# Patient Record
Sex: Female | Born: 1960 | Race: Black or African American | Hispanic: No | Smoking: Former smoker
Health system: Southern US, Community
[De-identification: ages and names within clinical notes are randomized; demographics above are authoritative.]

## PROBLEM LIST (undated history)

## (undated) DIAGNOSIS — C50919 Malignant neoplasm of unspecified site of unspecified female breast: Secondary | ICD-10-CM

## (undated) DIAGNOSIS — H269 Unspecified cataract: Secondary | ICD-10-CM

## (undated) DIAGNOSIS — IMO0001 Reserved for inherently not codable concepts without codable children: Secondary | ICD-10-CM

## (undated) DIAGNOSIS — I10 Essential (primary) hypertension: Secondary | ICD-10-CM

## (undated) DIAGNOSIS — D649 Anemia, unspecified: Secondary | ICD-10-CM

## (undated) DIAGNOSIS — Z98811 Dental restoration status: Secondary | ICD-10-CM

## (undated) DIAGNOSIS — F32A Depression, unspecified: Secondary | ICD-10-CM

## (undated) DIAGNOSIS — K219 Gastro-esophageal reflux disease without esophagitis: Secondary | ICD-10-CM

## (undated) DIAGNOSIS — M1990 Unspecified osteoarthritis, unspecified site: Secondary | ICD-10-CM

## (undated) DIAGNOSIS — F419 Anxiety disorder, unspecified: Secondary | ICD-10-CM

## (undated) DIAGNOSIS — IMO0002 Reserved for concepts with insufficient information to code with codable children: Secondary | ICD-10-CM

## (undated) DIAGNOSIS — E785 Hyperlipidemia, unspecified: Secondary | ICD-10-CM

## (undated) HISTORY — DX: Anxiety disorder, unspecified: F41.9

## (undated) HISTORY — DX: Depression, unspecified: F32.A

## (undated) HISTORY — DX: Anemia, unspecified: D64.9

## (undated) HISTORY — DX: Essential (primary) hypertension: I10

## (undated) HISTORY — DX: Unspecified osteoarthritis, unspecified site: M19.90

## (undated) HISTORY — DX: Hyperlipidemia, unspecified: E78.5

## (undated) HISTORY — DX: Reserved for concepts with insufficient information to code with codable children: IMO0002

## (undated) HISTORY — PX: FOOT SURGERY: SHX648

## (undated) HISTORY — DX: Reserved for inherently not codable concepts without codable children: IMO0001

## (undated) HISTORY — DX: Unspecified cataract: H26.9

---

## 1998-09-07 ENCOUNTER — Other Ambulatory Visit: Admission: RE | Admit: 1998-09-07 | Discharge: 1998-09-07 | Payer: Self-pay | Admitting: Obstetrics and Gynecology

## 1999-11-22 ENCOUNTER — Other Ambulatory Visit: Admission: RE | Admit: 1999-11-22 | Discharge: 1999-11-22 | Payer: Self-pay | Admitting: Obstetrics and Gynecology

## 1999-12-10 ENCOUNTER — Ambulatory Visit (HOSPITAL_COMMUNITY): Admission: RE | Admit: 1999-12-10 | Discharge: 1999-12-10 | Payer: Self-pay | Admitting: Obstetrics and Gynecology

## 1999-12-10 ENCOUNTER — Encounter: Payer: Self-pay | Admitting: Obstetrics and Gynecology

## 2000-10-26 ENCOUNTER — Encounter: Admission: RE | Admit: 2000-10-26 | Discharge: 2000-10-26 | Payer: Self-pay | Admitting: Family Medicine

## 2000-10-26 ENCOUNTER — Encounter: Payer: Self-pay | Admitting: Family Medicine

## 2000-12-30 ENCOUNTER — Other Ambulatory Visit: Admission: RE | Admit: 2000-12-30 | Discharge: 2000-12-30 | Payer: Self-pay | Admitting: Obstetrics and Gynecology

## 2001-11-29 ENCOUNTER — Encounter: Admission: RE | Admit: 2001-11-29 | Discharge: 2002-02-27 | Payer: Self-pay | Admitting: Family Medicine

## 2001-12-16 ENCOUNTER — Ambulatory Visit (HOSPITAL_COMMUNITY): Admission: RE | Admit: 2001-12-16 | Discharge: 2001-12-16 | Payer: Self-pay | Admitting: Obstetrics and Gynecology

## 2001-12-16 ENCOUNTER — Encounter: Payer: Self-pay | Admitting: Obstetrics and Gynecology

## 2002-01-25 ENCOUNTER — Other Ambulatory Visit: Admission: RE | Admit: 2002-01-25 | Discharge: 2002-01-25 | Payer: Self-pay | Admitting: Obstetrics and Gynecology

## 2003-02-07 ENCOUNTER — Ambulatory Visit (HOSPITAL_COMMUNITY): Admission: RE | Admit: 2003-02-07 | Discharge: 2003-02-07 | Payer: Self-pay | Admitting: Obstetrics and Gynecology

## 2004-07-31 ENCOUNTER — Other Ambulatory Visit: Admission: RE | Admit: 2004-07-31 | Discharge: 2004-07-31 | Payer: Self-pay | Admitting: Obstetrics and Gynecology

## 2005-09-22 ENCOUNTER — Other Ambulatory Visit: Admission: RE | Admit: 2005-09-22 | Discharge: 2005-09-22 | Payer: Self-pay | Admitting: Obstetrics and Gynecology

## 2007-12-16 ENCOUNTER — Encounter (INDEPENDENT_AMBULATORY_CARE_PROVIDER_SITE_OTHER): Payer: Self-pay | Admitting: Obstetrics and Gynecology

## 2007-12-16 ENCOUNTER — Inpatient Hospital Stay (HOSPITAL_COMMUNITY): Admission: RE | Admit: 2007-12-16 | Discharge: 2007-12-18 | Payer: Self-pay | Admitting: Obstetrics and Gynecology

## 2007-12-16 HISTORY — PX: ABDOMINAL HYSTERECTOMY: SHX81

## 2009-05-03 ENCOUNTER — Encounter: Admission: RE | Admit: 2009-05-03 | Discharge: 2009-05-03 | Payer: Self-pay | Admitting: Obstetrics and Gynecology

## 2009-09-27 ENCOUNTER — Ambulatory Visit (HOSPITAL_COMMUNITY): Admission: RE | Admit: 2009-09-27 | Discharge: 2009-09-27 | Payer: Self-pay | Admitting: Psychiatry

## 2010-02-24 ENCOUNTER — Encounter: Payer: Self-pay | Admitting: Family Medicine

## 2010-06-18 NOTE — H&P (Signed)
[name], [name]                ACCOUNT NO.:  [id number]   MEDICAL RECORD NO.:  [id number]        PATIENT TYPE:  WAMB   LOCATION:                                FACILITY:  [facility]   PHYSICIAN:  Sherron Monday, MD        DATE OF BIRTH:  26-Jul-1960   DATE OF ADMISSION:  DATE OF DISCHARGE:                              HISTORY & PHYSICAL   PROCEDURE PLANNED:  Laprascopic Hysterectomy, Possible total abdominal  hysterectomy.   PREOPERATIVE DIAGNOSES:  Dysmenorrhea and fibroids.   HISTORY OF PRESENT ILLNESS:  A 50 year old G2 P2 0-0-2 with complaints  of dysmenorrhea and premenstrual dysphoric disorder, as well as fibroid  uterus.  On exam, she states that she has trouble getting out of bed  when she is having her menstrual cycle and they have become heavier.   PAST MEDICAL HISTORY:  Significant for hematuria with a negative workup.  Past medical history is also significant for depression.  She has  recently started Lexapro with her primary care doctor.   PAST SURGICAL HISTORY:  Significant for bilateral tubal ligation.   PAST OB/GYN HISTORY:  G2 P2 0-0-2 with two term vaginal deliveries  without complications.  She has a remote history of an abnormal Pap  smear with remote cryotherapy.  They have been normal since this note.  No sexually transmitted diseases.  She is occasionally sexually active  with a female partner.  She has irregular menstrual cycles, uses it like  tubal for birth control and occasionally spots with her period, but not  bleeding in between periods or postcoital bleeding.  Hemoglobin is 12.3.   MEDICATIONS:  None.   ALLERGIES:  No known drug allergies.   SOCIAL HISTORY:  She is a 3-4 cigarettes per day smoker.  Occasional  alcohol use, no drug use.  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   FAMILY HISTORY:  Significant for coronary artery disease in father,  hypertension in mother and father, diabetes in maternal grandmother,  breast cancer in maternal aunt,  and no colon or ovarian cancer in her  family.   PHYSICAL EXAMINATION:  GENERAL:  No apparent distress.  CARDIOVASCULAR:  Regular rate and rhythm.  LUNGS:  Clear to auscultation bilaterally.  ABDOMEN:  Soft, nontender, nondistended with good bowel sounds.  EXTREMITIES:  Symmetric and nontender.  NECK:  No lymphadenopathy.  Thyroid within normal limits.  HEENT: Mucous membranes are moist.  BREAST:  No masses, nontender.  No distortion.  BACK:  Without vertebral angle tenderness.  PELVIC:  Normal external female genitalia.  Normal Bartholin and  urethral glands.  Good support.  Cervix and vagina without lesion.  No  cervical motion tenderness.  ABDOMINAL:  At the time of the initial exam, she had malodorous  discharge and firm.  Trichomonas and bacterial vaginitis for diagnosis  and these were treated with Flagyl.  On exam, her uterus was 14 weeks  size with obvious fibroids.   ASSESSMENT AND PLAN:  A 50 year old G2, P2 with dysmenorrhea, history of  premenstrual dysphoric disorder, and fibroid uterus who desires  definitive therapy.  We discussed with her risks, benefits, and  alternatives of hysterectomy including bleeding, infection, damage to  surrounding organs.  She voiced understanding of this and wishes to  proceed.  We will proceed with a hysterectomy on December 16, 2007.      Sherron Monday, MD  Electronically Signed     JB/MEDQ  D:  12/15/2007  T:  12/16/2007  Job:  [id number]

## 2010-06-18 NOTE — Op Note (Signed)
NAMEBRAYLIN, Jean [name]                ACCOUNT NO.:  [id number]   MEDICAL RECORD NO.:  [id number]          PATIENT TYPE:  INP   LOCATION:  9316                          FACILITY:  [facility]   PHYSICIAN:  Sherron Monday, MD        DATE OF BIRTH:  1960-03-27   DATE OF PROCEDURE:  DATE OF DISCHARGE:                               OPERATIVE REPORT   PREOPERATIVE DIAGNOSIS:  Dysmenorrhea.   POSTOPERATIVE DIAGNOSIS:  Dysmenorrhea.   PROCEDURE:  1. Diagnostic laparoscopy.  2. Total abdominal hysterectomy.   SURGEON:  Sherron Monday, MD   ASSISTANT:  Zenaida Niece, MD   ANESTHESIA:  General with 5 mL of 0.25% Marcaine for local anesthesia at  the umbilicus.   FINDINGS:  A 364-g uterus with large posterior fundal fibroid filling  the cul-de-sac.  Normal tubes and ovaries.   SPECIMENS:  Uterus and cervix to Pathology.   ESTIMATED BLOOD LOSS:  300 mL.   URINE OUTPUT:  600 mL clear urine at the end of procedure.   IV FLUIDS:  2800 mL IV fluids.   COMPLICATIONS:  None.   DISPOSITION:  Stable to the PACU.   PROCEDURE:  After informed consent was reviewed the patient including  risks, benefits, and alternatives of the surgical procedure, she was  transported to the operating room.  In the discussion about the  procedure, we discussed with the patient plan for a total laparoscopic  hysterectomy; however, if secondary to the size of her uterus or the  fibroids of the uterus making it difficult, we would have to proceed  with a total abdominal hysterectomy.  The patient voiced understanding.  She was then taken to the operating room, placed on the table in supine  position.  General anesthesia was induced and found to be adequate.  She  was then placed in the Yellofin stirrups, prepped and draped in the  normal sterile fashion.  A Foley catheter was sterilely placed in her  bladder.  Using a heavy weighted speculum and a Sims retractor, the  cervix was easily identified, was sized with the  instrumentation for a  total laparoscopic hysterectomy with the RUMI and cup, and the uterine  occluder was placed without difficulty.  Gloves were changed, and  attention was turned to the abdominal portion of the procedure.  At the  level of her previous incision in her umbilicus, an approximately 15-mm  incision was made.  Using the Veress needle, pneumoperitoneum was  obtained after checked to make sure the Veress was intraperitoneally  with the hanging drop test.  The trocar was then placed without  difficulty with direct placement, and the pelvic survey was performed  revealing a large posterior fundal fibroid entirely filling the cul-de-  sac and an otherwise bulky uterus making it impossible to proceed with a  total laparoscopic hysterectomy.  Decision was then made to convert to  an abdominal hysterectomy.  The instruments were removed from her  abdomen and vagina.  A single suture of 0 Vicryl was placed to  reapproximate the subcuticular adipose tissue.  Skin was closed with  3-0  Vicryl in a subcuticular fashion.  Attention was then turned to the  Pfannenstiel skin incision was made approximately 2 fingerbreadths above  the pubic symphysis, carried through the underlying layer of fascia.  Sharply, the fascia was then incised in the midline, and the incision  was extended laterally with Mayo scissors.  The inferior aspect of the  fascial incision was grasped and elevated with Kocher clamps, and the  rectus muscles were dissected off bluntly.  Attention was then turned to  the superior aspect of the fascial incision which in a similar fashion  was grasped with Kocher clamps, elevated, and the rectus muscles were  dissected off both bluntly and sharply.  Midline was easily identified  and peritoneum was entered bluntly.  The incision was extended  superiorly and inferiorly with good visualization of the bladder.  The  Alexis skin retractor was placed and checked carefully to make  sure no  bowel was entrapped.  The patient was then placed in Trendelenburg, and  the bowel was packed with 3 moist laparotomy sponges.  The uterus was  grasped at the cornu with Kelly clamps.  The round ligaments were easily  identified and ligated with Bovie cautery.  Anteriorly, the bladder flap  was created using Bovie cautery.  On the patient's left, the ovary was  adhered to the uterus.  This was removed using sharp dissection.  The  utero-ovarian vessel was then doubly ligated with 0 Vicryl.  Attention  was turned to skeletonizing the uterine arteries which was done.  The  arteries were suture ligated with 0 Vicryl bilaterally.  The cardinal  ligaments were ligated, pedicles were inspected and found to be  hemostatic.  In a stepwise fashion, the pedicles were suture ligated  using 0 Vicryl.  At the level of the uterosacral, the pedicle was held.  The uterus was easily transected and removed.  The vaginal cuff was  closed with several sutures of 0 Vicryl.  Hemostasis was assured.  The  pedicles were inspected carefully.  Copious irrigation was performed.  An uterosacral ligament plication was performed.  The ureters were  identified during the dissection and found to be well outside the field.  Following the uterosacral ligament plication, the Alexis skin retractor  was removed as well as with 3 sponges.  The peritoneum was manually  reapproximated.  The rectus muscles were made hemostatic with Bovie  cautery.  The fascia was then reapproximated using 0 Vicryl in a running  fashion to the midline bilaterally.  The subcuticular adipose layer was  made hemostatic and irrigated.  The subcuticular adipose layer was then  reapproximated using 2-0 plain gut.  Skin was closed with staples.  The  patient tolerated the procedure well.  Sponge, lap, and needle counts  were correct x2 at the end of the procedure.      Sherron Monday, MD  Electronically Signed     JB/MEDQ  D:  12/16/2007   T:  12/16/2007  Job:  [id number]

## 2010-06-21 NOTE — Discharge Summary (Signed)
Jean [name], Jean [name]                ACCOUNT NO.:  [id number]   MEDICAL RECORD NO.:  [id number]          PATIENT TYPE:  INP   LOCATION:  9316                          FACILITY:  [facility]   PHYSICIAN:  Sherron Monday, MD        DATE OF BIRTH:  1960/12/25   DATE OF ADMISSION:  12/16/2007  DATE OF DISCHARGE:  12/18/2007                               DISCHARGE SUMMARY   PREOPERATIVE DIAGNOSIS:  Dysmenorrhea, uterine fibroids.   POSTOPERATIVE DIAGNOSIS:  Dysmenorrhea,  uterine fibroids.   PROCEDURE:  Attempt of laparoscopic assisted vaginal hysterectomy, total  abdominal hysterectomy.   HISTORY:  A 50 year old G2, P2-0-0-2 with selective dysmenorrhea and  PMDD as well as fibroids.  On exam, she has trouble getting out of bed  with her menstrual cycle because she has so much pain and cramping.  They have also become heavier in the last several months.   PAST MEDICAL HISTORY:  Significant for hematuria with negative workup  and also depression, and she has recently started Lexapro.   PAST SURGICAL HISTORY:  Significant for bilateral tubal ligation.   PAST OB/GYN HISTORY:  G2, P2 with 2 term vaginal deliveries without  complications, remote history of an abnormal Pap smear with cryotherapy,  normal since this time.  No sexually transmitted disease.   MEDICATIONS:  None.   ALLERGIES:  No known drug allergies.   SOCIAL HISTORY:  A 3-4 cigarettes per day smoker, occasional alcohol  use.  No drug use.  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   FAMILY HISTORY:  Significant for coronary artery disease, hypertension,  diabetes, and breast cancer.   PHYSICAL EXAMINATION:  On admission, she is afebrile.  Vital signs are  stable and a benign exam.  She was admitted and the plan was to perform  the laparoscopic vaginal hysterectomy with possibility of a total  abdominal hysterectomy.  Discussed with the patient risks, benefits, and  alternatives of the surgical procedure.  She voiced  understanding of all  of these.  On the day of surgery, she underwent a diagnostic  laparoscopy, posterior fibroid was noted __________ make it impossible  to do a laparoscopic hysterectomy.  We will proceed with a total  abdominal hysterectomy without complication.  Her uterus weighed 364 g.  The EBL was 300 mL.  Her postoperative course was relatively  uncomplicated, aside from elevated blood pressure.  She was started on  atenolol for blood pressure control and will follow this up with her  primary care Aadhira Heffernan.  Her postoperative course was otherwise  uncomplicated.  Her hemoglobin decreased from 11.9 to 10.0.  Her  creatinine was stable at 0.64.  She was  discharged to home on postoperative day #2 with routine discharge  instructions and numbers to call with any questions or problems as well  as prescriptions for Percocet and some atenolol.  She  voiced  understanding of this and will be discharged to home and follow up in  about several weeks.      Sherron Monday, MD  Electronically Signed     JB/MEDQ  D:  03/03/2008  T:  03/03/2008  Job:  [id number]

## 2010-11-05 LAB — BASIC METABOLIC PANEL
BUN: 14
BUN: 5 — ABNORMAL LOW
CO2: 24
Calcium: 8.7
Calcium: 9.1
Chloride: 106
Creatinine, Ser: 0.62
Creatinine, Ser: 0.64
GFR calc Af Amer: >60
GFR calc non Af Amer: >60
GFR calc non Af Amer: >60
Glucose, Bld: 107 — ABNORMAL HIGH
Glucose, Bld: 119 — ABNORMAL HIGH
Potassium: 4
Potassium: 4.4
Sodium: 138

## 2010-11-05 LAB — CBC
HCT: 36.1
MCV: 84.3
Platelets: 320
Platelets: 349
RDW: 13.5
RDW: 14.3
WBC: 4.5
WBC: 8.8

## 2010-11-05 LAB — PREGNANCY, URINE: Preg Test, Ur: NEGATIVE

## 2011-02-11 ENCOUNTER — Other Ambulatory Visit: Payer: Self-pay | Admitting: Family Medicine

## 2011-02-11 ENCOUNTER — Other Ambulatory Visit (HOSPITAL_COMMUNITY)
Admission: RE | Admit: 2011-02-11 | Discharge: 2011-02-11 | Disposition: A | Discharge status: Home / Self Care | Payer: BC Managed Care – PPO | Source: Ambulatory Visit | Attending: Family Medicine | Admitting: Family Medicine

## 2011-02-11 DIAGNOSIS — Z1159 Encounter for screening for other viral diseases: Secondary | ICD-10-CM | POA: Insufficient documentation

## 2011-02-11 DIAGNOSIS — Z124 Encounter for screening for malignant neoplasm of cervix: Secondary | ICD-10-CM | POA: Insufficient documentation

## 2011-02-11 DIAGNOSIS — Z113 Encounter for screening for infections with a predominantly sexual mode of transmission: Secondary | ICD-10-CM | POA: Insufficient documentation

## 2011-02-14 ENCOUNTER — Other Ambulatory Visit: Payer: Self-pay | Admitting: Family Medicine

## 2011-02-14 DIAGNOSIS — Z1231 Encounter for screening mammogram for malignant neoplasm of breast: Secondary | ICD-10-CM

## 2011-02-24 ENCOUNTER — Ambulatory Visit
Admission: RE | Admit: 2011-02-24 | Discharge: 2011-02-24 | Disposition: A | Discharge status: Home / Self Care | Payer: BC Managed Care – PPO | Source: Ambulatory Visit | Attending: Family Medicine | Admitting: Family Medicine

## 2011-02-24 DIAGNOSIS — Z1231 Encounter for screening mammogram for malignant neoplasm of breast: Secondary | ICD-10-CM

## 2012-04-23 ENCOUNTER — Other Ambulatory Visit: Payer: Self-pay

## 2012-04-23 DIAGNOSIS — Z1231 Encounter for screening mammogram for malignant neoplasm of breast: Secondary | ICD-10-CM

## 2012-06-01 ENCOUNTER — Ambulatory Visit
Admission: RE | Admit: 2012-06-01 | Discharge: 2012-06-01 | Disposition: A | Discharge status: Home / Self Care | Payer: BC Managed Care – PPO | Source: Ambulatory Visit

## 2012-06-01 DIAGNOSIS — Z1231 Encounter for screening mammogram for malignant neoplasm of breast: Secondary | ICD-10-CM

## 2013-06-20 ENCOUNTER — Other Ambulatory Visit: Payer: Self-pay | Admitting: Physician Assistant

## 2013-06-20 DIAGNOSIS — R51 Headache: Secondary | ICD-10-CM

## 2013-07-13 ENCOUNTER — Other Ambulatory Visit: Payer: Self-pay

## 2013-07-13 DIAGNOSIS — Z1231 Encounter for screening mammogram for malignant neoplasm of breast: Secondary | ICD-10-CM

## 2013-07-20 ENCOUNTER — Encounter (INDEPENDENT_AMBULATORY_CARE_PROVIDER_SITE_OTHER): Payer: Self-pay

## 2013-07-20 ENCOUNTER — Ambulatory Visit
Admission: RE | Admit: 2013-07-20 | Discharge: 2013-07-20 | Disposition: A | Discharge status: Home / Self Care | Payer: BC Managed Care – PPO | Source: Ambulatory Visit

## 2013-07-20 DIAGNOSIS — Z1231 Encounter for screening mammogram for malignant neoplasm of breast: Secondary | ICD-10-CM

## 2013-07-22 ENCOUNTER — Other Ambulatory Visit: Payer: Self-pay | Admitting: Obstetrics and Gynecology

## 2013-07-22 DIAGNOSIS — R928 Other abnormal and inconclusive findings on diagnostic imaging of breast: Secondary | ICD-10-CM

## 2013-08-01 ENCOUNTER — Ambulatory Visit
Admission: RE | Admit: 2013-08-01 | Discharge: 2013-08-01 | Disposition: A | Discharge status: Home / Self Care | Payer: BC Managed Care – PPO | Source: Ambulatory Visit | Attending: Obstetrics and Gynecology | Admitting: Obstetrics and Gynecology

## 2013-08-01 ENCOUNTER — Other Ambulatory Visit: Payer: Self-pay | Admitting: Obstetrics and Gynecology

## 2013-08-01 ENCOUNTER — Ambulatory Visit: Payer: BC Managed Care – PPO

## 2013-08-01 DIAGNOSIS — R928 Other abnormal and inconclusive findings on diagnostic imaging of breast: Secondary | ICD-10-CM

## 2013-08-01 DIAGNOSIS — N6320 Unspecified lump in the left breast, unspecified quadrant: Secondary | ICD-10-CM

## 2013-08-03 DIAGNOSIS — C50919 Malignant neoplasm of unspecified site of unspecified female breast: Secondary | ICD-10-CM

## 2013-08-03 HISTORY — DX: Malignant neoplasm of unspecified site of unspecified female breast: C50.919

## 2013-08-09 ENCOUNTER — Ambulatory Visit
Admission: RE | Admit: 2013-08-09 | Discharge: 2013-08-09 | Disposition: A | Discharge status: Home / Self Care | Payer: BC Managed Care – PPO | Source: Ambulatory Visit | Attending: Obstetrics and Gynecology | Admitting: Obstetrics and Gynecology

## 2013-08-09 ENCOUNTER — Other Ambulatory Visit: Payer: Self-pay | Admitting: Obstetrics and Gynecology

## 2013-08-09 DIAGNOSIS — N6320 Unspecified lump in the left breast, unspecified quadrant: Secondary | ICD-10-CM

## 2013-08-10 ENCOUNTER — Other Ambulatory Visit: Payer: Self-pay | Admitting: Obstetrics and Gynecology

## 2013-08-10 DIAGNOSIS — C50919 Malignant neoplasm of unspecified site of unspecified female breast: Secondary | ICD-10-CM

## 2013-08-12 ENCOUNTER — Ambulatory Visit
Admission: RE | Admit: 2013-08-12 | Discharge: 2013-08-12 | Disposition: A | Discharge status: Home / Self Care | Payer: BC Managed Care – PPO | Source: Ambulatory Visit | Attending: Obstetrics and Gynecology | Admitting: Obstetrics and Gynecology

## 2013-08-12 ENCOUNTER — Telehealth: Payer: Self-pay | Admitting: *Deleted

## 2013-08-12 DIAGNOSIS — C50919 Malignant neoplasm of unspecified site of unspecified female breast: Secondary | ICD-10-CM

## 2013-08-12 DIAGNOSIS — C50412 Malignant neoplasm of upper-outer quadrant of left female breast: Secondary | ICD-10-CM | POA: Insufficient documentation

## 2013-08-12 MED ORDER — GADOBENATE DIMEGLUMINE 529 MG/ML IV SOLN
18.0000 mL | Freq: Once | INTRAVENOUS | Status: AC | PRN
  Administered 2013-08-12: 18 mL via INTRAVENOUS

## 2013-08-12 NOTE — Telephone Encounter (Signed)
Confirmed [facility] for 08/17/13 at 0830 .  Instructions and contact information given.

## 2013-08-17 ENCOUNTER — Encounter: Payer: Self-pay | Admitting: *Deleted

## 2013-08-17 ENCOUNTER — Ambulatory Visit
Admission: RE | Admit: 2013-08-17 | Discharge: 2013-08-17 | Disposition: A | Discharge status: Home / Self Care | Payer: BC Managed Care – PPO | Source: Ambulatory Visit | Attending: Radiation Oncology | Admitting: Radiation Oncology

## 2013-08-17 ENCOUNTER — Ambulatory Visit (HOSPITAL_BASED_OUTPATIENT_CLINIC_OR_DEPARTMENT_OTHER): Payer: BC Managed Care – PPO | Admitting: Surgery

## 2013-08-17 ENCOUNTER — Ambulatory Visit: Payer: BC Managed Care – PPO | Attending: Surgery | Admitting: Physical Therapy

## 2013-08-17 ENCOUNTER — Encounter: Payer: Self-pay | Admitting: Dietician

## 2013-08-17 VITALS — BP 153/100 | HR 64 | Temp 98.3°F | Ht 64.5 in | Wt 199.7 lb

## 2013-08-17 DIAGNOSIS — R293 Abnormal posture: Secondary | ICD-10-CM | POA: Insufficient documentation

## 2013-08-17 DIAGNOSIS — M549 Dorsalgia, unspecified: Secondary | ICD-10-CM | POA: Insufficient documentation

## 2013-08-17 DIAGNOSIS — C50412 Malignant neoplasm of upper-outer quadrant of left female breast: Secondary | ICD-10-CM

## 2013-08-17 DIAGNOSIS — C50919 Malignant neoplasm of unspecified site of unspecified female breast: Secondary | ICD-10-CM

## 2013-08-17 DIAGNOSIS — IMO0001 Reserved for inherently not codable concepts without codable children: Secondary | ICD-10-CM | POA: Diagnosis not present

## 2013-08-17 DIAGNOSIS — C50912 Malignant neoplasm of unspecified site of left female breast: Secondary | ICD-10-CM

## 2013-08-17 NOTE — Progress Notes (Unsigned)
RD educated pt at [facility] on 08/17/2013  Pt interested in learning nutrition recommendations of breast cancer population. Diet recall indicates pt consumes typically 2 meals/day. Lunch consists of soup and sandwich, and dinner is breakfast foods (bacon, eggs) as pt works night shift. Enjoys eating red meats and sweets.  Encouraged pt to increase fruits and vegetables intake, minimize red meats by consuming plant based proteins or leaner animal proteins, and to avoid skipping meals to ensure pt consuming adequate nutrients for weight maintenance during treatments  Discussed incorporating smoothies or meal replacement shakes/supplements for nutrients and to meet goal of five servings of fruits and vegetables per day.   Reviewed organic vs non-organic and clarified questions concerning fad diets/sugar causing cancer myths.  Expect fair compliance. Pt noted she will work towards more balanced meals, and consuming less red meats. Provided pt with outpatient oncology RD contact information and encouraged pt to contact for any further nutrition-related questions or concerns  Atlee Abide MS RD LDN Clinical Dietitian [phone]

## 2013-08-17 NOTE — Progress Notes (Signed)
Radiation Oncology         [phone]) [phone] ________________________________  Initial outpatient Consultation - Date: 08/17/2013   Name: Jean [name] MRN: [id number]   DOB: May 29, 1960  REFERRING PHYSICIAN: Shann Medal, MD  DIAGNOSIS: T2N0 Invasive Ductal Carcinoma  HISTORY OF PRESENT ILLNESS::Jean [name] is a 53 y.o. female  Who was found to have a left breast mass on screening mammogram in the lower inner quadrant.  This measured 1.8 cm on ultrasound. She had a biopsy which revealed an invasive ductal carcinoma plus DCIS.  The cancer was grade 2 and ER positive at 100%, PR+ at 91% and HER2- with a Ki67 of 47%. MRI showed a 2.1 x 1.2 x 2.1 cm.  The right breast and lymph nodes are negative. She presents with her fiancee today for discussion of treatment options. She had menses at 53 years of age with her last period in 2010 at the time of her hysterectomy.  She has not used hormone replacement therapy. She is Dolores P2 with her first live birth at 33. She is healed well from her biopsy. She does have some soreness.  PREVIOUS RADIATION THERAPY: No  PAST MEDICAL HISTORY:  has no past medical history on file.    PAST SURGICAL HISTORY:No past surgical history on file.  FAMILY HISTORY: No family history on file.  SOCIAL HISTORY:  History  Substance Use Topics  . Smoking status: Not on file  . Smokeless tobacco: Not on file  . Alcohol Use: Not on file    ALLERGIES: Review of patient's allergies indicates no known allergies.  MEDICATIONS:  No current outpatient prescriptions on file.   No current facility-administered medications for this encounter.    REVIEW OF SYSTEMS:  A 15 point review of systems is documented in the electronic medical record. This was obtained by the nursing staff. However, I reviewed this with the patient to discuss relevant findings and make appropriate changes.  Pertinent items are noted in HPI.  PHYSICAL EXAM:  Alert and oriented x3. She has no palpable  abnormalities of the right breast. In the left breast in the lower inner quadrant is a 1 cm area of palpable biopsy change/tumor.  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  LABS:  None obtained today.  EXAM: BILATERAL BREAST MRI WITH AND WITHOUT CONTRAST  TECHNIQUE: Multiplanar, multisequence MR images of both breasts were obtained prior to and following the intravenous administration of 58m of MultiHance.  THREE-DIMENSIONAL MR IMAGE RENDERING ON INDEPENDENT WORKSTATION:  Three-dimensional MR images were rendered by post-processing of the original MR data on an independent workstation. The three-dimensional MR images were interpreted, and findings are reported in the following complete MRI report for this study. Three dimensional images were evaluated at the independent DynaCad workstation  COMPARISON:  Recent mammogram, ultrasound and biopsy examinations.  FINDINGS: Breast composition: d.  Extreme fibroglandular tissue.  Background parenchymal enhancement: Mild to moderate  Right  breast: 7 mm rounded, circumscribed enhancing mass in the central breast, laterally with a central and eccentric nonenhancing component, similar in appearance to a mammographically stable benign appearing intramammary lymph node in the upper outer left breast. No mammographic correlate for the mass in the right breast.  Left breast: 2.1 x 1.2 x 1.2 cm irregular mass in the medial left breast in the 9 o'clock position. This contains a biopsy tract and  adjacent biopsy marker clip artifact. Mammographically stable normal appearing intramammary lymph node in the upper outer left breast, anteriorly.  Lymph nodes: No abnormal appearing lymph nodes.  Ancillary findings: Asymmetrically prominent right internal jugular vein. This is a normal finding.  IMPRESSION: 1. 2.1 x 1.2 x 1.2 cm biopsy-proven invasive ductal carcinoma and ductal carcinoma in situ in the 9 o'clock position of the left breast. 2. Normal central, lateral right breast intramammary lymph node with no evidence of malignancy on the right.  RECOMMENDATION: Treatment plan.  BI-RADS CATEGORY  6: Known biopsy-proven malignancy.   Electronically Signed   By: Enrique Sack M.D.   On: 08/12/2013 15:57   Mm Digital Diagnostic Bilat  08/01/2013   CLINICAL DATA:  Possible masses in both breasts at recent screening mammography.  EXAM: DIGITAL DIAGNOSTIC  BILATERAL MAMMOGRAM WITH CAD  ULTRASOUND LEFT BREAST  COMPARISON:  Previous examinations, including the screening mammogram dated 07/20/2013.  ACR Breast Density Category c: The breast tissue is heterogeneously dense, which may obscure small masses.  FINDINGS: True lateral and spot compression views of the right breast demonstrate normal appearing fibroglandular tissue at the locations of the recently suspected masses.  Spot compression and spot magnification views of the left breast confirm an oval area of increased density with poorly defined margins and associated microcalcifications in the medial aspect of the breast. Some of the calcifications are arranged in a linear fashion and the calcifications are predominantly oval in configuration.  Mammographic images were processed with CAD.  On physical exam, the patient has an approximately 2 cm oval, faintly palpable mass in the 9 o'clock position of the left breast, 5 cm from the nipple. No palpable left axillary lymph nodes.  Ultrasound is performed, showing a 1.8 x 1.4 x 1.0 cm oval, vertically oriented,  hypoechoic mass containing microcalcifications in the 9 o'clock position of the left breast, 5 cm from the nipple. This has poorly defined margins and exhibits dense posterior acoustical shadowing.  Normal appearing left axillary lymph nodes were demonstrated.  IMPRESSION: 1.8 cm mass with mammographic and ultrasound features suspicious for malignancy in the 9 o'clock position of the left breast. The recently suspected right breast masses represented overlapping of normal tissue.  RECOMMENDATION: Left breast ultrasound-guided core needle biopsy. This has been scheduled for 11 a.m. on 08/09/2013.  I have discussed the findings and recommendations with the patient. Results were also provided in writing at the conclusion of the visit. If applicable, a reminder letter will be sent to the patient regarding the next appointment.  BI-RADS CATEGORY  4: Suspicious.   Electronically Signed   By: Enrique Sack M.D.   On: 08/01/2013 10:51   Mm Digital Diagnostic Unilat L  08/09/2013   CLINICAL DATA:  Status post ultrasound-guided left breast biopsy 9 o'clock location mass  EXAM: DIAGNOSTIC left MAMMOGRAM POST ULTRASOUND BIOPSY  COMPARISON:  Previous exams  FINDINGS: Mammographic images were obtained following ultrasound guided biopsy of left breast mass 9 o'clock location with wing shaped clip placement. The clip is appropriately located.  IMPRESSION: Appropriate location  of wing shaped clip at the site of left breast mass 9 o'clock location biopsy.  Final Assessment: Post Procedure Mammograms for Marker Placement   Electronically Signed   By: Conchita Paris M.D.   On: 08/09/2013 12:20   Mm Digital Screening Bilateral  07/22/2013   CLINICAL DATA:  Screening.  EXAM: DIGITAL SCREENING BILATERAL MAMMOGRAM WITH CAD  COMPARISON:  Previous Exam(s)  ACR Breast Density Category c: The breast tissue is heterogeneously dense, which may obscure small masses.  FINDINGS: In the left breast possible mass warrants further imaging  evaluation with spot compression views and possible ultrasound. In the right breast, possible masses warrant further imaging evaluation with spot compression views and possible ultrasound. Images were processed with CAD.  IMPRESSION: Further imaging evaluation is suggested for possible mass in the left breast.  Further imaging evaluation is suggested for possible masses in the right breast.  RECOMMENDATION: Diagnostic mammogram and possibly ultrasound of both breasts. (Code:[id number])  The patient will be contacted regarding the findings, and additional imaging will be scheduled.  BI-RADS CATEGORY  0: Incomplete. Need additional imaging evaluation and/or prior mammograms for comparison.   Electronically Signed   By: Marin Olp M.D.   On: 07/22/2013 08:03   US Breast Ltd Uni Left Inc Axilla  08/01/2013   CLINICAL DATA:  Possible masses in both breasts at recent screening mammography.  EXAM: DIGITAL DIAGNOSTIC  BILATERAL MAMMOGRAM WITH CAD  ULTRASOUND LEFT BREAST  COMPARISON:  Previous examinations, including the screening mammogram dated 07/20/2013.  ACR Breast Density Category c: The breast tissue is heterogeneously dense, which may obscure small masses.  FINDINGS: True lateral and spot compression views of the right breast demonstrate normal appearing fibroglandular tissue at the locations of the recently suspected masses.  Spot compression and spot magnification views of the left breast confirm an oval area of increased density with poorly defined margins and associated microcalcifications in the medial aspect of the breast. Some of the calcifications are arranged in a linear fashion and the calcifications are predominantly oval in configuration.  Mammographic images were processed with CAD.  On physical exam, the patient has an approximately 2 cm oval, faintly palpable mass in the 9 o'clock position of the left breast, 5 cm from the nipple. No palpable left axillary lymph nodes.  Ultrasound is performed,  showing a 1.8 x 1.4 x 1.0 cm oval, vertically oriented, hypoechoic mass containing microcalcifications in the 9 o'clock position of the left breast, 5 cm from the nipple. This has poorly defined margins and exhibits dense posterior acoustical shadowing.  Normal appearing left axillary lymph nodes were demonstrated.  IMPRESSION: 1.8 cm mass with mammographic and ultrasound features suspicious for malignancy in the 9 o'clock position of the left breast. The recently suspected right breast masses represented overlapping of normal tissue.  RECOMMENDATION: Left breast ultrasound-guided core needle biopsy. This has been scheduled for 11 a.m. on 08/09/2013.  I have discussed the findings and recommendations with the patient. Results were also provided in writing at the conclusion of the visit. If applicable, a reminder letter will be sent to the patient regarding the next appointment.  BI-RADS CATEGORY  4: Suspicious.   Electronically Signed   By: Enrique Sack M.D.   On: 08/01/2013 10:51   [place] Lt Breast Bx W Loc Dev 1st Lesion Img Bx Spec US Guide  08/11/2013   ADDENDUM REPORT: 08/10/2013 11:50  ADDENDUM: Pathology revealed invasive ductal carcinoma and ductal carcinoma in situ in the left breast. This was found  to be concordant by Dr. Lillia Mountain. Pathology was discussed with the patient by telephone. The patient reported doing well after the biopsy with minimal tenderness at the site. Post biopsy instructions were reviewed and her questions were answered. An appointment has been made for the [facility] on August 17, 2013. She will be contacted regarding scheduling a bilateral breast MRI. The patient was encouraged to come to [facility] for educational materials. My number was provided to her for future questions and concerns.  Pathology results reported by Susa Raring RN, BSN on August 10, 2013.   Electronically Signed   By: Conchita Paris M.D.   On: 08/10/2013  11:50   08/11/2013   CLINICAL DATA:  Suspicious left breast mass 9 o'clock location  EXAM: ULTRASOUND GUIDED left BREAST CORE NEEDLE BIOPSY  COMPARISON:  Previous exams.  PROCEDURE: I met with the patient and we discussed the procedure of ultrasound-guided biopsy, including benefits and alternatives. We discussed the high likelihood of a successful procedure. We discussed the risks of the procedure including infection, bleeding, tissue injury, clip migration, and inadequate sampling. Informed written consent was given. The usual time-out protocol was performed immediately prior to the procedure.  Using sterile technique and 2% Lidocaine as local anesthetic, under direct ultrasound visualization, a 12 gauge vacuum-assisteddevice was used to perform biopsy of left breast mass 9 o'clock location using a lateral to medial approach. At the conclusion of the procedure, a wing shaped tissue marker clip was deployed into the biopsy cavity. Follow-up 2-view mammogram was performed and dictated separately.  IMPRESSION: Ultrasound-guided biopsy of left breast mass 9 o'clock location with wing shaped clip placement. Pathology is pending. No apparent complications.  Electronically Signed: By: Conchita Paris M.D. On: 08/09/2013 12:02      IMPRESSION: T2N0 Invasive Ductal Carcinoma  PLAN:I spoke to the patient today regarding her diagnosis and options for treatment. We discussed the equivalence in terms of survival and local failure between mastectomy and breast conservation. We discussed the role of radiation in decreasing local failures in patients who undergo lumpectomy. We discussed the process of simulation and the placement tattoos. We discussed 4-6 weeks of treatment as an outpatient. We discussed the possibility of asymptomatic lung damage. We discussed the low likelihood of secondary malignancies. We discussed the possible side effects including but not limited to skin redness, fatigue, permanent skin darkening, and  breast swelling. We discussed the process of simulation and the placement of tattoos. I will see her back after her Oncotype score. I did clarify with her that if she needed chemotherapy this would be performed prior to radiation.  I will plan on seeing her back after her surgery.  I spent 40 minutes face to face with the patient and more than 50% of that time was spent in counseling and/or coordination of care. ------------------------------------------------  Thea Silversmith, MD

## 2013-08-17 NOTE — Progress Notes (Signed)
 Re:   Jean [name] DOB:   09/22/1960 MRN:   [id number]  [facility]  ASSESSMENT AND PLAN: 1.  Left breast cancer, 9 o'clock, T2, N0  MRI - 7/102015 - 1. 2.1 x 1.2 x 1.2 cm biopsy-proven invasive ductal carcinoma and ductal carcinoma in situ in the 9 o'clock position of the left breast  Biopsy - 08/09/2013 - Path ([id number]) - Invasive ductal ca with lymph vascular invasion, grade 2/3, ER - 100%, PR - 91%, Ki67 - 47%, Her2Neu - Neg  Oncology - Dr. Wentworth (no med onc)  I discussed the options for breast cancer treatment with the patient. She is in the [facility] and understands the treatment of breast cancer includes medical oncology and radiation oncology.  I discussed the surgical options of lumpectomy vs. mastectomy.  If mastectomy, there is the possibility of reconstruction.   I discussed the options of lymph node biopsy.  The treatment plan depends on the pathologic staging of the tumor and the patient's personal wishes.  The risks of surgery include, but are not limited to, bleeding, infection, the need for further surgery, and nerve injury.  The patient has been given literature on the treatment of breast cancer.  Plan:  1) left breast lumpectomy (seed) and left axillary SLNBx, 2) Oncotype, 3) see med onc after path  2. Chronic back pain  No chief complaint on file.  REFERRING PHYSICIAN: RICHARDSON,KATHY W, MD  HISTORY OF PRESENT ILLNESS: Jean [name] is a 52 y.o. (DOB: 01/24/1961)  AA  female whose primary care physician is RICHARDSON,KATHY W, MD (she sees Dr. A. Nnodi as her PCP) and comes to [facility] today for new left breast. She is accompanied with her fiancee, Thomas McCray.  She noticed no change in her breasts.  Her last period was 2010.  She had a hysterectomy for fibroids by Dr. Misenger.  She is on no hormones.  She has an aunt (father's sister) who had breast cancer.  Her last mammogram was April 2014.  Mammogram and US - 08/01/2013 - 1.8 cm mass with mammographic  and ultrasound features suspicious for malignancy in the 9 o'clock position of the left breast. The recently suspected right breast masses represented overlapping of normal tissue.  MRI - 7/102015 - 1. 2.1 x 1.2 x 1.2 cm biopsy-proven invasive ductal carcinoma and ductal carcinoma in situ in the 9 o'clock position of the left breast. 2. Normal central, lateral right breast intramammary lymph node with no evidence of malignancy on the right.  Biopsy - 08/09/2013 - Path ([id number]) - Invasive ductal ca with lymph vascular invasion, grade 2/3, ER - 100%, PR - 91%, Ki67 - 47%, Her2Neu - Neg    No past medical history on file.   No past surgical history on file.   No current outpatient prescriptions on file.   No current facility-administered medications for this visit.    No Known Allergies  REVIEW OF SYSTEMS: Skin:  No history of rash.  No history of abnormal moles. Infection:  No history of hepatitis or HIV.  No history of MRSA. Neurologic:  No history of stroke.  No history of seizure.  No history of headaches. Cardiac:  No history of hypertension. No history of heart disease.  No history of seeing a cardiologist. Pulmonary:  Does not smoke cigarettes.  No asthma or bronchitis.  No OSA/CPAP.  Endocrine:  No diabetes. No thyroid disease. Gastrointestinal:  No history of stomach disease.  No history of liver disease.    No history of gall bladder disease.  No history of pancreas disease.  Colonoscopy 2014 by [facility].  She cannot remember the name of the GI doctor. Urologic:  No history of kidney stones.  No history of bladder infections. Musculoskeletal:  Chronic back pain.  She is not seeing anyone for this.  Hematologic:  No bleeding disorder.  No history of anemia.  Not anticoagulated. Psycho-social:  The patient is oriented.   The patient has no obvious psychologic or social impairment to understanding our conversation and plan.  SOCIAL and FAMILY HISTORY: She is accompanied with her  fiancee, Thomas McCray. She works at manufacturing at [employer] She has two boys:  30 and 33.  PHYSICAL EXAM: There were no vitals taken for this visit.  General: WN AA F who is alert and generally healthy appearing.  HEENT: Normal. Pupils equal. Neck: Supple. No mass.  No thyroid mass. Lymph Nodes:  No supraclavicular, cervical, or axillary nodes. Lungs: Clear to auscultation and symmetric breath sounds. Heart:  RRR. No murmur or rub. Breasts:  Right - no mass  Left - bruise at 9 o'clock, no mass  Abdomen: Soft. No mass. No tenderness. No hernia. Normal bowel sounds.  Pfannenstiel incision. Rectal: Not done. Extremities:  Good strength and ROM  in upper and lower extremities. Neurologic:  Grossly intact to motor and sensory function. Psychiatric: Has normal mood and affect. Behavior is normal.   DATA REVIEWED: Epic notes  Erina Hamme, MD,  FACS [facility] [address]  [address]   [place], [place]    [address] Phone:  [phone] FAX:  [phone]  

## 2013-08-17 NOTE — Progress Notes (Signed)
[facility] Psychosocial Distress Screening Clinical Social Work  Patient completed distress screening protocol, and scored a 7 on the Psychosocial Distress Thermometer which indicates moderate distress. Clinical Social Worker met with pt in [facility] to assess for distress and other psychosocial needs.  Pt stated she was doing "ok", and felt more comfortable after speaking with the physicians and getting more information on her treatment pt.  CSW and pt discussed the importance of emotional support during treatment, and sharing information with her family.  CSW provided pt with information on the support team and support services at [facility].  Pt was agreeable to an [employer] referral.  CSW encouraged pt to call with any additional questions or concerns.     Clinical Social Worker follow up needed: Yes.    If yes, follow up plan: [facility] Guide Referral          [facility] DISTRESS SCREENING 08/17/2013  Screening Type Initial Screening  Elta Guadeloupe the number that describes how much distress you have been experiencing in the past week 7  Practical problem type Insurance;Housing  Emotional problem type Depression;Nervousness/Anxiety  Physical Problem type Sexual problems  Physician notified of physical symptoms Yes  Referral to clinical psychology No  Referral to clinical social work Yes  Referral to dietition No  Referral to financial advocate No  Referral to support programs No  Referral to palliative care No    Johnnye Lana, MSW, [place] [phone]

## 2013-08-18 ENCOUNTER — Other Ambulatory Visit (INDEPENDENT_AMBULATORY_CARE_PROVIDER_SITE_OTHER): Payer: Self-pay | Admitting: Surgery

## 2013-08-18 DIAGNOSIS — C50912 Malignant neoplasm of unspecified site of left female breast: Secondary | ICD-10-CM

## 2013-08-22 ENCOUNTER — Telehealth: Payer: Self-pay | Admitting: *Deleted

## 2013-08-22 NOTE — Telephone Encounter (Signed)
Left message for a return phone call from [facility] 08/17/13.  Awaiting patient response.

## 2013-08-22 NOTE — Telephone Encounter (Signed)
Received call back from patient and confirmed appointment with Dr. Lindi [name] for 09/20/13 at 830am.  Explained to her that this appointment may have to changed due to her oncotype score.  Encouraged her to call with any questions or concerns.

## 2013-08-23 ENCOUNTER — Telehealth: Payer: Self-pay | Admitting: *Deleted

## 2013-08-23 ENCOUNTER — Encounter (HOSPITAL_BASED_OUTPATIENT_CLINIC_OR_DEPARTMENT_OTHER): Payer: Self-pay | Admitting: *Deleted

## 2013-08-23 DIAGNOSIS — C50412 Malignant neoplasm of upper-outer quadrant of left female breast: Secondary | ICD-10-CM

## 2013-08-23 NOTE — Telephone Encounter (Signed)
Spoke with patient and informed her to be here at [facility] on 09/20/13.  Patient verbalized understanding.

## 2013-08-29 ENCOUNTER — Ambulatory Visit
Admission: RE | Admit: 2013-08-29 | Discharge: 2013-08-29 | Disposition: A | Discharge status: Home / Self Care | Payer: BC Managed Care – PPO | Source: Ambulatory Visit | Attending: Surgery | Admitting: Surgery

## 2013-08-29 DIAGNOSIS — C50912 Malignant neoplasm of unspecified site of left female breast: Secondary | ICD-10-CM

## 2013-08-30 ENCOUNTER — Ambulatory Visit (HOSPITAL_COMMUNITY)
Admission: RE | Admit: 2013-08-30 | Discharge: 2013-08-30 | Disposition: A | Discharge status: Home / Self Care | Payer: BC Managed Care – PPO | Source: Ambulatory Visit | Attending: Surgery | Admitting: Surgery

## 2013-08-30 ENCOUNTER — Encounter (HOSPITAL_BASED_OUTPATIENT_CLINIC_OR_DEPARTMENT_OTHER): Payer: Self-pay | Admitting: Anesthesiology

## 2013-08-30 ENCOUNTER — Ambulatory Visit
Admission: RE | Admit: 2013-08-30 | Discharge: 2013-08-30 | Disposition: A | Discharge status: Home / Self Care | Payer: BC Managed Care – PPO | Source: Ambulatory Visit | Attending: Surgery | Admitting: Surgery

## 2013-08-30 ENCOUNTER — Encounter (HOSPITAL_BASED_OUTPATIENT_CLINIC_OR_DEPARTMENT_OTHER)
Admission: RE | Disposition: A | Discharge status: Home / Self Care | Payer: Self-pay | Source: Ambulatory Visit | Attending: Surgery

## 2013-08-30 ENCOUNTER — Encounter (HOSPITAL_BASED_OUTPATIENT_CLINIC_OR_DEPARTMENT_OTHER): Payer: BC Managed Care – PPO | Admitting: Anesthesiology

## 2013-08-30 ENCOUNTER — Ambulatory Visit (HOSPITAL_BASED_OUTPATIENT_CLINIC_OR_DEPARTMENT_OTHER)
Admission: RE | Admit: 2013-08-30 | Discharge: 2013-08-30 | Disposition: A | Discharge status: Home / Self Care | Payer: BC Managed Care – PPO | Source: Ambulatory Visit | Attending: Surgery | Admitting: Surgery

## 2013-08-30 ENCOUNTER — Ambulatory Visit (HOSPITAL_BASED_OUTPATIENT_CLINIC_OR_DEPARTMENT_OTHER): Payer: BC Managed Care – PPO | Admitting: Anesthesiology

## 2013-08-30 DIAGNOSIS — C50912 Malignant neoplasm of unspecified site of left female breast: Secondary | ICD-10-CM

## 2013-08-30 DIAGNOSIS — G8929 Other chronic pain: Secondary | ICD-10-CM | POA: Insufficient documentation

## 2013-08-30 DIAGNOSIS — Z87891 Personal history of nicotine dependence: Secondary | ICD-10-CM | POA: Insufficient documentation

## 2013-08-30 DIAGNOSIS — D0590 Unspecified type of carcinoma in situ of unspecified breast: Secondary | ICD-10-CM

## 2013-08-30 DIAGNOSIS — C50919 Malignant neoplasm of unspecified site of unspecified female breast: Secondary | ICD-10-CM

## 2013-08-30 DIAGNOSIS — C50412 Malignant neoplasm of upper-outer quadrant of left female breast: Secondary | ICD-10-CM

## 2013-08-30 DIAGNOSIS — K219 Gastro-esophageal reflux disease without esophagitis: Secondary | ICD-10-CM | POA: Insufficient documentation

## 2013-08-30 DIAGNOSIS — M549 Dorsalgia, unspecified: Secondary | ICD-10-CM | POA: Insufficient documentation

## 2013-08-30 HISTORY — DX: Malignant neoplasm of unspecified site of unspecified female breast: C50.919

## 2013-08-30 HISTORY — DX: Dental restoration status: Z98.811

## 2013-08-30 HISTORY — DX: Gastro-esophageal reflux disease without esophagitis: K21.9

## 2013-08-30 HISTORY — PX: OTHER SURGICAL HISTORY: SHX169

## 2013-08-30 LAB — POCT HEMOGLOBIN-HEMACUE: Hemoglobin: 12 g/dL (ref 12.0–15.0)

## 2013-08-30 SURGERY — RADIOACTIVE SEED GUIDED PARTIAL MASTECTOMY WITH AXILLARY SENTINEL LYMPH NODE BIOPSY
Anesthesia: Regional | Site: Breast | Laterality: Left

## 2013-08-30 MED ORDER — METHYLENE BLUE 1 % INJ SOLN
INTRAMUSCULAR | Status: AC
  Filled 2013-08-30: qty 10

## 2013-08-30 MED ORDER — LIDOCAINE HCL (CARDIAC) 20 MG/ML IV SOLN
INTRAVENOUS | Status: DC | PRN
  Administered 2013-08-30: 40 mg via INTRAVENOUS

## 2013-08-30 MED ORDER — FENTANYL CITRATE 0.05 MG/ML IJ SOLN
50.0000 ug | INTRAMUSCULAR | Status: DC | PRN
  Administered 2013-08-30: 100 ug via INTRAVENOUS

## 2013-08-30 MED ORDER — CEFAZOLIN SODIUM-DEXTROSE 2-3 GM-% IV SOLR: 2.0000 g | INTRAVENOUS | Status: DC

## 2013-08-30 MED ORDER — BUPIVACAINE-EPINEPHRINE (PF) 0.25% -1:200000 IJ SOLN
INTRAMUSCULAR | Status: DC | PRN
  Administered 2013-08-30: 20 mL

## 2013-08-30 MED ORDER — TECHNETIUM TC 99M SULFUR COLLOID FILTERED
1.0000 | Freq: Once | INTRAVENOUS | Status: AC | PRN
  Administered 2013-08-30: 1 via INTRADERMAL

## 2013-08-30 MED ORDER — OXYCODONE HCL 5 MG PO TABS
5.0000 mg | ORAL_TABLET | Freq: Once | ORAL | Status: AC | PRN
  Administered 2013-08-30: 5 mg via ORAL

## 2013-08-30 MED ORDER — HYDROMORPHONE HCL PF 1 MG/ML IJ SOLN
0.2500 mg | INTRAMUSCULAR | Status: DC | PRN
  Administered 2013-08-30 (×2): 0.5 mg via INTRAVENOUS

## 2013-08-30 MED ORDER — HYDRALAZINE HCL 20 MG/ML IJ SOLN
INTRAMUSCULAR | Status: AC
  Filled 2013-08-30: qty 1

## 2013-08-30 MED ORDER — LACTATED RINGERS IV SOLN
INTRAVENOUS | Status: DC
  Administered 2013-08-30 (×2): via INTRAVENOUS

## 2013-08-30 MED ORDER — DEXAMETHASONE SODIUM PHOSPHATE 4 MG/ML IJ SOLN
INTRAMUSCULAR | Status: DC | PRN
  Administered 2013-08-30: 10 mg via INTRAVENOUS

## 2013-08-30 MED ORDER — ONDANSETRON HCL 4 MG/2ML IJ SOLN
INTRAMUSCULAR | Status: DC | PRN
  Administered 2013-08-30: 4 mg via INTRAVENOUS

## 2013-08-30 MED ORDER — ROPIVACAINE HCL 5 MG/ML IJ SOLN
INTRAMUSCULAR | Status: DC | PRN
  Administered 2013-08-30: 35 mL

## 2013-08-30 MED ORDER — FENTANYL CITRATE 0.05 MG/ML IJ SOLN
INTRAMUSCULAR | Status: DC | PRN
  Administered 2013-08-30 (×2): 50 ug via INTRAVENOUS

## 2013-08-30 MED ORDER — FENTANYL CITRATE 0.05 MG/ML IJ SOLN
INTRAMUSCULAR | Status: AC
  Filled 2013-08-30: qty 6

## 2013-08-30 MED ORDER — CEFAZOLIN SODIUM-DEXTROSE 2-3 GM-% IV SOLR
INTRAVENOUS | Status: AC
  Filled 2013-08-30: qty 50

## 2013-08-30 MED ORDER — SODIUM CHLORIDE 0.9 % IJ SOLN
INTRAMUSCULAR | Status: AC
  Filled 2013-08-30: qty 10

## 2013-08-30 MED ORDER — PROPOFOL 10 MG/ML IV BOLUS
INTRAVENOUS | Status: DC | PRN
  Administered 2013-08-30: 200 mg via INTRAVENOUS

## 2013-08-30 MED ORDER — CHLORHEXIDINE GLUCONATE 4 % EX LIQD: 1.0000 "application " | Freq: Once | CUTANEOUS | Status: DC

## 2013-08-30 MED ORDER — BUPIVACAINE-EPINEPHRINE (PF) 0.5% -1:200000 IJ SOLN
INTRAMUSCULAR | Status: AC
  Filled 2013-08-30: qty 30

## 2013-08-30 MED ORDER — MIDAZOLAM HCL 2 MG/2ML IJ SOLN
INTRAMUSCULAR | Status: AC
  Filled 2013-08-30: qty 2

## 2013-08-30 MED ORDER — OXYCODONE HCL 5 MG/5ML PO SOLN: 5.0000 mg | Freq: Once | ORAL | Status: AC | PRN

## 2013-08-30 MED ORDER — OXYCODONE HCL 5 MG PO TABS
ORAL_TABLET | ORAL | Status: AC
  Filled 2013-08-30: qty 1

## 2013-08-30 MED ORDER — HYDRALAZINE HCL 20 MG/ML IJ SOLN
5.0000 mg | INTRAMUSCULAR | Status: AC
  Administered 2013-08-30 (×2): 5 mg via INTRAVENOUS

## 2013-08-30 MED ORDER — MIDAZOLAM HCL 5 MG/5ML IJ SOLN
INTRAMUSCULAR | Status: DC | PRN
  Administered 2013-08-30: 2 mg via INTRAVENOUS

## 2013-08-30 MED ORDER — MIDAZOLAM HCL 2 MG/ML PO SYRP: 12.0000 mg | ORAL_SOLUTION | Freq: Once | ORAL | Status: DC | PRN

## 2013-08-30 MED ORDER — CEFAZOLIN SODIUM-DEXTROSE 2-3 GM-% IV SOLR
INTRAVENOUS | Status: DC | PRN
  Administered 2013-08-30: 2 g via INTRAVENOUS

## 2013-08-30 MED ORDER — HYDROMORPHONE HCL PF 1 MG/ML IJ SOLN
INTRAMUSCULAR | Status: AC
  Filled 2013-08-30: qty 1

## 2013-08-30 MED ORDER — FENTANYL CITRATE 0.05 MG/ML IJ SOLN
INTRAMUSCULAR | Status: AC
  Filled 2013-08-30: qty 2

## 2013-08-30 MED ORDER — MIDAZOLAM HCL 2 MG/2ML IJ SOLN
1.0000 mg | INTRAMUSCULAR | Status: DC | PRN
  Administered 2013-08-30: 2 mg via INTRAVENOUS

## 2013-08-30 MED ORDER — BUPIVACAINE-EPINEPHRINE (PF) 0.25% -1:200000 IJ SOLN
INTRAMUSCULAR | Status: AC
  Filled 2013-08-30: qty 30

## 2013-08-30 MED ORDER — HYDROCODONE-ACETAMINOPHEN 5-325 MG PO TABS: 1.0000 | ORAL_TABLET | Freq: Four times a day (QID) | ORAL | Status: DC | PRN

## 2013-08-30 SURGICAL SUPPLY — 60 items
BENZOIN TINCTURE PRP APPL 2/3 (GAUZE/BANDAGES/DRESSINGS) IMPLANT
BINDER BREAST LRG (GAUZE/BANDAGES/DRESSINGS) IMPLANT
BINDER BREAST MEDIUM (GAUZE/BANDAGES/DRESSINGS) IMPLANT
BINDER BREAST XLRG (GAUZE/BANDAGES/DRESSINGS) ×3 IMPLANT
BINDER BREAST XXLRG (GAUZE/BANDAGES/DRESSINGS) IMPLANT
BLADE HEX COATED 2.75 (ELECTRODE) ×3 IMPLANT
BLADE SURG 10 STRL SS (BLADE) ×3 IMPLANT
BLADE SURG 15 STRL LF DISP TIS (BLADE) ×1 IMPLANT
BLADE SURG 15 STRL SS (BLADE) ×2
CANISTER SUC SOCK COL 7IN (MISCELLANEOUS) IMPLANT
CANISTER SUCT 1200ML W/VALVE (MISCELLANEOUS) ×3 IMPLANT
CHLORAPREP W/TINT 26ML (MISCELLANEOUS) ×3 IMPLANT
CLIP TI WIDE RED SMALL 6 (CLIP) ×6 IMPLANT
CLOSURE WOUND 1/4X4 (GAUZE/BANDAGES/DRESSINGS)
COVER MAYO STAND STRL (DRAPES) ×3 IMPLANT
COVER PROBE W GEL 5X96 (DRAPES) ×3 IMPLANT
COVER SURGICAL LIGHT HANDLE (MISCELLANEOUS) ×3 IMPLANT
COVER TABLE BACK 60X90 (DRAPES) ×3 IMPLANT
DECANTER SPIKE VIAL GLASS SM (MISCELLANEOUS) IMPLANT
DERMABOND ADVANCED (GAUZE/BANDAGES/DRESSINGS) ×2
DERMABOND ADVANCED .7 DNX12 (GAUZE/BANDAGES/DRESSINGS) ×1 IMPLANT
DEVICE DUBIN W/COMP PLATE 8390 (MISCELLANEOUS) ×3 IMPLANT
DRAIN CHANNEL 19F RND (DRAIN) IMPLANT
DRAPE LAPAROSCOPIC ABDOMINAL (DRAPES) ×3 IMPLANT
DRAPE UTILITY XL STRL (DRAPES) ×3 IMPLANT
DRSG PAD ABDOMINAL 8X10 ST (GAUZE/BANDAGES/DRESSINGS) IMPLANT
ELECT COATED BLADE 2.86 ST (ELECTRODE) ×3 IMPLANT
ELECT REM PT RETURN 9FT ADLT (ELECTROSURGICAL) ×3
ELECTRODE REM PT RTRN 9FT ADLT (ELECTROSURGICAL) ×1 IMPLANT
GAUZE SPONGE 4X4 12PLY STRL (GAUZE/BANDAGES/DRESSINGS) ×6 IMPLANT
GLOVE BIO SURGEON STRL SZ7 (GLOVE) ×3 IMPLANT
GLOVE BIOGEL PI IND STRL 7.0 (GLOVE) ×1 IMPLANT
GLOVE BIOGEL PI INDICATOR 7.0 (GLOVE) ×2
GLOVE ECLIPSE 7.5 STRL STRAW (GLOVE) ×3 IMPLANT
GLOVE SURG SIGNA 7.5 PF LTX (GLOVE) ×6 IMPLANT
GOWN STRL REUS W/ TWL LRG LVL3 (GOWN DISPOSABLE) ×1 IMPLANT
GOWN STRL REUS W/ TWL XL LVL3 (GOWN DISPOSABLE) ×2 IMPLANT
GOWN STRL REUS W/TWL LRG LVL3 (GOWN DISPOSABLE) ×2
GOWN STRL REUS W/TWL XL LVL3 (GOWN DISPOSABLE) ×4
KIT MARKER MARGIN INK (KITS) ×3 IMPLANT
NDL SAFETY ECLIPSE 18X1.5 (NEEDLE) IMPLANT
NEEDLE HYPO 18GX1.5 SHARP (NEEDLE)
NEEDLE HYPO 25X1 1.5 SAFETY (NEEDLE) ×3 IMPLANT
NS IRRIG 1000ML POUR BTL (IV SOLUTION) ×3 IMPLANT
PACK BASIN DAY SURGERY FS (CUSTOM PROCEDURE TRAY) ×3 IMPLANT
PENCIL BUTTON HOLSTER BLD 10FT (ELECTRODE) ×3 IMPLANT
PIN SAFETY STERILE (MISCELLANEOUS) IMPLANT
SHEET MEDIUM DRAPE 40X70 STRL (DRAPES) ×3 IMPLANT
SLEEVE SCD COMPRESS KNEE MED (MISCELLANEOUS) ×3 IMPLANT
SPONGE GAUZE 4X4 12PLY STER LF (GAUZE/BANDAGES/DRESSINGS) IMPLANT
SPONGE LAP 18X18 X RAY DECT (DISPOSABLE) ×3 IMPLANT
STRIP CLOSURE SKIN 1/4X4 (GAUZE/BANDAGES/DRESSINGS) IMPLANT
SUT MON AB 5-0 PS2 18 (SUTURE) ×3 IMPLANT
SUT VICRYL 3-0 CR8 SH (SUTURE) ×3 IMPLANT
SYRINGE CONTROL L 12CC (SYRINGE) ×3 IMPLANT
TOWEL OR 17X24 6PK STRL BLUE (TOWEL DISPOSABLE) ×3 IMPLANT
TOWEL OR NON WOVEN STRL DISP B (DISPOSABLE) IMPLANT
TUBE CONNECTING 20'X1/4 (TUBING) ×1
TUBE CONNECTING 20X1/4 (TUBING) ×2 IMPLANT
YANKAUER SUCT BULB TIP NO VENT (SUCTIONS) ×3 IMPLANT

## 2013-08-30 NOTE — Progress Notes (Signed)
Assisted nuc med tech [facility] with nuc med inj. . Side rails up, monitors on throughout procedure. See vital signs in flow sheet. Tolerated Procedure well.

## 2013-08-30 NOTE — Progress Notes (Signed)
Assisted Dr. Ola Spurr with left, ultrasound guided, pectoralis block. Side rails up, monitors on throughout procedure. See vital signs in flow sheet. Tolerated Procedure well.

## 2013-08-30 NOTE — Interval H&P Note (Signed)
History and Physical Interval Note:  08/30/2013 10:40 AM  Jean [name]  has presented today for surgery, with the diagnosis of left breast cancer  The various methods of treatment have been discussed with the patient and family.  I used the Neoprobe to check the seed location.  Her fiancee, [name], is at the bedside.  After consideration of risks, benefits and other options for treatment, the patient has consented to  Procedure(s): LEFT BREAST LUMPECTOMY RADIOACTIVE SEED AND LEFT  AXILLARY SENTINEL LYMPH NODE BIOPSY (Left) as a surgical intervention .  The patient's history has been reviewed, patient examined, no change in status, stable for surgery.  I have reviewed the patient's chart and labs.  Questions were answered to the patient's satisfaction.     [name]

## 2013-08-30 NOTE — H&P (View-Only) (Signed)
Re:   DAIZHA ANAND DOB:   05/18/1960 MRN:   [id number]  [facility]  ASSESSMENT AND PLAN: 1.  Left breast cancer, 9 o'clock, T2, N0  MRI - 7/102015 - 1. 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 Pablo Ledger (no med onc)  I discussed the options for breast cancer treatment with the patient. She is in the [facility] [place] and understands the treatment of breast cancer includes medical oncology and radiation oncology.  I discussed the surgical options of lumpectomy vs. mastectomy.  If mastectomy, there is the possibility of reconstruction.   I discussed the options of lymph node biopsy.  The treatment plan depends on the pathologic staging of the tumor and the patient's personal wishes.  The risks of surgery include, but are not limited to, bleeding, infection, the need for further surgery, and nerve injury.  The patient has been given literature on the treatment of breast cancer.  Plan:  1) left breast lumpectomy (seed) and left axillary SLNBx, 2) Oncotype, 3) see med onc after path  2. Chronic back pain  No chief complaint on file.  REFERRING PHYSICIAN: Logan Bores, MD  HISTORY OF PRESENT ILLNESS: CLEMIE GENERAL is a 53 y.o. (DOB: [birth year])  AA  female whose primary care physician is Logan Bores, MD (she sees Dr. Dutch Quint as her PCP) and comes to [facility] [place] today for new left breast. She is accompanied with her fiancee, [name].  She noticed no change in her breasts.  Her last period was 2010.  She had a hysterectomy for fibroids by Dr. Alvin Critchley.  She is on no hormones.  She has an aunt (father's sister) who had breast cancer.  Her last mammogram was April 2014.  Mammogram and [place] - 08/01/2013 - 1.8 cm mass with mammographic  and ultrasound features suspicious for malignancy in the 9 o'clock position of the left breast. The recently suspected right breast masses represented overlapping of normal tissue.  MRI - 7/102015 - 1. 2.1 x 1.2 x 1.2 cm biopsy-proven invasive ductal carcinoma and ductal carcinoma in situ in the 9 o'clock position of the left breast. 2. Normal central, lateral right breast intramammary lymph node with no evidence of malignancy on the right.  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   No past surgical history on file.   No current outpatient prescriptions on file.   No current facility-administered medications for this visit.    No Known Allergies  REVIEW OF SYSTEMS: Skin:  No history of rash.  No history of abnormal moles. Infection:  No history of hepatitis or HIV.  No history of MRSA. Neurologic:  No history of stroke.  No history of seizure.  No history of headaches. Cardiac:  No history of hypertension. No history of heart disease.  No history of seeing a cardiologist. Pulmonary:  Does not smoke cigarettes.  No asthma or bronchitis.  No OSA/CPAP.  Endocrine:  No diabetes. No thyroid disease. Gastrointestinal:  No history of stomach disease.  No history of liver disease.  No history of gall bladder disease.  No history of pancreas disease.  Colonoscopy 2014 by [facility].  She cannot remember the name of the GI doctor. Urologic:  No history of kidney stones.  No history of bladder infections. Musculoskeletal:  Chronic back pain.  She is not seeing anyone for this.  Hematologic:  No bleeding disorder.  No history of anemia.  Not anticoagulated. Psycho-social:  The patient is oriented.   The patient has no obvious psychologic or social impairment to understanding our conversation and plan.  SOCIAL and FAMILY HISTORY: She is accompanied with her  Stark Bray. She works at [employer] at [employer] She has two boys:  35 and 78.  PHYSICAL EXAM: There were no vitals taken for this visit.  General: WN AA F who is alert and generally healthy appearing.  HEENT: Normal. Pupils equal. Neck: Supple. No mass.  No thyroid mass. Lymph Nodes:  No supraclavicular, cervical, or axillary nodes. Lungs: Clear to auscultation and symmetric breath sounds. Heart:  RRR. No murmur or rub. Breasts:  Right - no mass  Left - bruise at 9 o'clock, no mass  Abdomen: Soft. No mass. No tenderness. No hernia. Normal bowel sounds.  Pfannenstiel incision. Rectal: Not done. Extremities:  Good strength and ROM  in upper and lower extremities. Neurologic:  Grossly intact to motor and sensory function. Psychiatric: Has normal mood and affect. Behavior is normal.   DATA REVIEWED: Epic notes  [name], MD,  [facility] [address] [place]  [place], [place]    [place] Phone:  [place]:  [phone]

## 2013-08-30 NOTE — Anesthesia Postprocedure Evaluation (Signed)
  Anesthesia Post-op Note  Patient: Jean [name]  Procedure(s) Performed: Procedure(s): LEFT BREAST LUMPECTOMY RADIOACTIVE SEED AND LEFT  AXILLARY SENTINEL LYMPH NODE BIOPSY (Left)  Patient Location: PACU  Anesthesia Type:General and block  Level of Consciousness: awake and alert   Airway and Oxygen Therapy: Patient Spontanous Breathing  Post-op Pain: moderate  Post-op Assessment: Post-op Vital signs reviewed, Patient's Cardiovascular Status Stable and Respiratory Function Stable  Post-op Vital Signs: Reviewed  Filed Vitals:   08/30/13 1340  BP: 138/95  Pulse: 84  Temp:   Resp: 14    Complications: No apparent anesthesia complications

## 2013-08-30 NOTE — Transfer of Care (Signed)
Immediate Anesthesia Transfer of Care Note  Patient: Jean [name]  Procedure(s) Performed: Procedure(s): LEFT BREAST LUMPECTOMY RADIOACTIVE SEED AND LEFT  AXILLARY SENTINEL LYMPH NODE BIOPSY (Left)  Patient Location: PACU  Anesthesia Type:General  Level of Consciousness: oriented, sedated and patient cooperative  Airway & Oxygen Therapy: Patient Spontanous Breathing and Patient connected to face mask oxygen  Post-op Assessment: Report given to PACU RN and Post -op Vital signs reviewed and stable  Post vital signs: Reviewed and stable  Complications: No apparent anesthesia complications

## 2013-08-30 NOTE — Op Note (Signed)
08/30/2013  12:36 PM  PATIENT:  Jean [name] DOB: 04-Jan-1961 MRN: [id number]  PREOP DIAGNOSIS:  left breast cancer  POSTOP DIAGNOSIS:   left breast cancer, 9 o'clock position (T1, N0)  PROCEDURE:   Procedure(s):  LEFT BREAST LUMPECTOMY RADIOACTIVE SEED AND LEFT  AXILLARY SENTINEL LYMPH NODE BIOPSY  SURGEON:   [name], M.D.  ANESTHESIA:   general  Anesthesiologist: Arabella Merles, MD CRNA: Baxter Flattery, CRNA; Marrianne Mood, CRNA  General  EBL:  minimal  ml  DRAINS: none   LOCAL MEDICATIONS USED:   20 cc 1/4% marcaine.  She had a pectoral block also  SPECIMEN:   Left breast lumpectomy and left axillary sentinel lymph node   COUNTS CORRECT:  YES  INDICATIONS FOR PROCEDURE:  Jean [name] is a 53 y.o. (DOB: 02-Apr-1960) AA  female whose primary care physician is NNODI, ADAKU, MD and comes for left breast lumpectomy and left axillary sentinel lymph node biopsy.  She has seen Dr. Pablo Ledger with me at the [facility] [place].  She has not seen an medical oncologist.   The options for breast cancer treatment have been discussed with the patient. She elected to proceed with lumpectomy and axillary sentinel lymph node.     The indications and potential complications of surgery were explained to the patient. Potential complications include, but are not limited to, bleeding, infection, the need for further surgery, and nerve injury.     She had a I131 seed placed on 08/29/2013 in her left breast at [facility].  I confirmed the presence of the I131 seed in the pre op area using the Neoprobe.  The seed is in the 9 o'clock position of the left breast.   In the holding area, her left areola was injected with 1 millicurie of Technitium Sulfur Colloid.  OPERATIVE NOTE:   The patient was taken to room # 8 at [facility] where she underwent a general anesthesia  supervised by Anesthesiologist: Arabella Merles, MD CRNA: Baxter Flattery, CRNA; Marrianne Mood, CRNA. Her left  breast and axilla were prepped with  ChloraPrep and sterilely draped.    A time-out and the surgical check list was reviewed.    I turned attention to the cancer which was about at the 9 o'clock position of the left breast.   I used the Neoprobe to identify the I131 seed.  I tried to excise an area around the tumor of at least 1 cm.    I excised this block of breast tissue approximately 5 cm by 5 cm  in diameter.   I painted the lumpectomy specimen with the 6 color paint kit and did a specimen mammogram which confirmed the mass, clip, and the seed were all in the right position and the specimen.  The specimen was sent to pathology who called back to confirm that they have the seed and the specimen.   I then started the left axillary sentinel lymph node biopsy. I made an incision in the left axilla.  I found a hot area at the junction of the breast and the pectoralis major muscle. I cut down and  identified a hot node that had counts of 600 and the background has 0 counts. I checked her internal mammary nodes and supraclavicular nodes with the neoprobe and found no other hot area. The axillary node was then sent to pathology.    I then irrigated the wound with saline. I infiltrated approximately 20 mL of 1/4%  local between the incisions.  Dr. Ola Spurr had placed a left breast block.   I placed 6 clips to mark biopsy cavity, at 12, 3, 6, and 9 o'clock. Two clips were placed on the pectoralis major.   I then closed all the wounds in layers using 3-0 Vicryl sutures for the deep layer. At the skin, I closed the incisions with a 5-0 Monocryl suture. The incisions were then painted with Dermabond.  She had gauze place over the wounds and placed in a breast binder.   The patient tolerated the procedure well, was transported to the recovery room in good condition. Sponge and needle count were correct at the end of the case.   Final pathology is pending.   [name], MD, [facility]  Surgery Pager: [phone] Office phone:  [phone]

## 2013-08-30 NOTE — Discharge Instructions (Signed)
[facility] - DISCHARGE INSTRUCTIONS TO PATIENT  Activity:  Driving - May drive tomorrow, if doing well   Lifting - No lifting more than 15 pounds for 5 days, then no limit  Wound Care:   Leave bandages on until Thursday, 7/30, then may shower  Diet:  As tolerated.  Follow up appointment:  Call Dr. Pollie Friar office [facility]) at [phone] for an appointment in 2 to 3 weeks.  Medications and dosages:  Resume your home medications.  You have a prescription for:  Vicodin  Call Dr. Lucia Gaskins or his office  [phone]) if you have:  Temperature greater than 100.4,  Persistent nausea and vomiting,  Severe uncontrolled pain,  Redness, tenderness, or signs of infection (pain, swelling, redness, odor or green/yellow discharge around the site),  Difficulty breathing, headache or visual disturbances,  Any other questions or concerns you may have after discharge.  In an emergency, call 911 or go to an Emergency Department at a nearby hospital.   Post Anesthesia Home Care Instructions  Activity: Get plenty of rest for the remainder of the day. A responsible adult should stay with you for 24 hours following the procedure.  For the next 24 hours, DO NOT: -Drive a car -Paediatric nurse -Drink alcoholic beverages -Take any medication unless instructed by your physician -Make any legal decisions or sign important papers.  Meals: Start with liquid foods such as gelatin or soup. Progress to regular foods as tolerated. Avoid greasy, spicy, heavy foods. If nausea and/or vomiting occur, drink only clear liquids until the nausea and/or vomiting subsides. Call your physician if vomiting continues.  Special Instructions/Symptoms: Your throat may feel dry or sore from the anesthesia or the breathing tube placed in your throat during surgery. If this causes discomfort, gargle with warm salt water. The discomfort should disappear within 24 hours.

## 2013-08-30 NOTE — Anesthesia Procedure Notes (Addendum)
Anesthesia Regional Block:  Pectoralis block  Pre-Anesthetic Checklist: ,, timeout performed, Correct Patient, Correct Site, Correct Laterality, Correct Procedure, Correct Position, site marked, Risks and benefits discussed, pre-op evaluation, post-op pain management  Laterality: Left  Prep: Maximum Sterile Barrier Precautions used and chloraprep       Needles:  Injection technique: Single-shot  Needle Type: Echogenic Stimulator Needle     Needle Length: 9cm 9 cm Needle Gauge: 21 and 21 G    Additional Needles:  Procedures: ultrasound guided (picture in chart) Pectoralis block Narrative:  Start time: 08/30/2013 9:54 AM End time: 08/30/2013 10:04 AM Injection made incrementally with aspirations every 5 mL. Anesthesiologist: Fitzgerald,MD  Additional Notes: 2% Lidocaine skin wheel.   Procedure Name: LMA Insertion Date/Time: 08/30/2013 11:05 AM Performed by: Toula Moos L Pre-anesthesia Checklist: Patient identified, Emergency Drugs available, Suction available, Patient being monitored and Timeout performed Patient Re-evaluated:Patient Re-evaluated prior to inductionOxygen Delivery Method: Circle System Utilized Preoxygenation: Pre-oxygenation with 100% oxygen Intubation Type: IV induction Ventilation: Mask ventilation without difficulty LMA: LMA inserted LMA Size: 4.0 Number of attempts: 1 Airway Equipment and Method: bite block Placement Confirmation: positive ETCO2 and breath sounds checked- equal and bilateral Tube secured with: Tape Dental Injury: Teeth and Oropharynx as per pre-operative assessment

## 2013-08-30 NOTE — Anesthesia Preprocedure Evaluation (Addendum)
Anesthesia Evaluation  Patient identified by MRN, date of birth, ID band Patient awake    Reviewed: Allergy & Precautions, H&P , NPO status , Patient's Chart, lab work & pertinent test results  Airway Mallampati: I TM Distance: >3 FB Neck ROM: Full    Dental no notable dental hx. (+) Teeth Intact, Dental Advisory Given   Pulmonary neg pulmonary ROS, former smoker,  breath sounds clear to auscultation  Pulmonary exam normal       Cardiovascular negative cardio ROS  Rhythm:Regular Rate:Normal     Neuro/Psych negative neurological ROS  negative psych ROS   GI/Hepatic Neg liver ROS, GERD-  Controlled,  Endo/Other  negative endocrine ROS  Renal/GU negative Renal ROS  negative genitourinary   Musculoskeletal   Abdominal   Peds  Hematology negative hematology ROS (+)   Anesthesia Other Findings   Reproductive/Obstetrics negative OB ROS                         Anesthesia Physical Anesthesia Plan  ASA: II  Anesthesia Plan: General and Regional   Post-op Pain Management:    Induction: Intravenous  Airway Management Planned: LMA  Additional Equipment:   Intra-op Plan:   Post-operative Plan: Extubation in OR  Informed Consent: I have reviewed the patients History and Physical, chart, labs and discussed the procedure including the risks, benefits and alternatives for the proposed anesthesia with the patient or authorized representative who has indicated his/her understanding and acceptance.   Dental advisory given  Plan Discussed with: CRNA  Anesthesia Plan Comments:        Anesthesia Quick Evaluation

## 2013-09-01 ENCOUNTER — Other Ambulatory Visit (INDEPENDENT_AMBULATORY_CARE_PROVIDER_SITE_OTHER): Payer: Self-pay

## 2013-09-01 ENCOUNTER — Telehealth (INDEPENDENT_AMBULATORY_CARE_PROVIDER_SITE_OTHER): Payer: Self-pay

## 2013-09-01 DIAGNOSIS — D0592 Unspecified type of carcinoma in situ of left breast: Secondary | ICD-10-CM

## 2013-09-01 NOTE — Telephone Encounter (Signed)
Pt s/p seed lumpectomy 7/28 by Dr Lucia Gaskins and needs 2wk post op appt. Please call pt with appt date.

## 2013-09-02 ENCOUNTER — Encounter: Payer: Self-pay | Admitting: *Deleted

## 2013-09-02 NOTE — Progress Notes (Signed)
Received order for oncotype dx testing from Dr. Lucia Gaskins. Requisition sent to pathology. Received by Adonis Brook. PAC sent to [facility].

## 2013-09-05 ENCOUNTER — Telehealth (INDEPENDENT_AMBULATORY_CARE_PROVIDER_SITE_OTHER): Payer: Self-pay

## 2013-09-05 NOTE — Telephone Encounter (Signed)
V/M  P/O appt with DR. Newman 09/21/13 830 am

## 2013-09-12 ENCOUNTER — Encounter (HOSPITAL_COMMUNITY): Payer: Self-pay

## 2013-09-15 ENCOUNTER — Ambulatory Visit
Admission: RE | Admit: 2013-09-15 | Discharge: 2013-09-15 | Disposition: A | Discharge status: Home / Self Care | Payer: BC Managed Care – PPO | Source: Ambulatory Visit | Attending: Radiation Oncology | Admitting: Radiation Oncology

## 2013-09-15 ENCOUNTER — Ambulatory Visit: Payer: BC Managed Care – PPO

## 2013-09-15 ENCOUNTER — Telehealth: Payer: Self-pay | Admitting: *Deleted

## 2013-09-15 NOTE — Telephone Encounter (Signed)
Spoke with patient and informed Dr. Pablo Ledger would like to see her after her appointment with Dr. Lindi Adie and she would get a call from Santiago Glad to reschedule that appointment. Encouraged her to call with any needs or concerns.

## 2013-09-16 ENCOUNTER — Encounter: Payer: Self-pay | Admitting: Radiation Oncology

## 2013-09-16 NOTE — Progress Notes (Signed)
Fax rec'd 09/15/13 from [place] for Disability.  Patient seen in [facility] 08/17/13.  Forwarded forms to RN for processing

## 2013-09-20 ENCOUNTER — Encounter: Payer: Self-pay | Admitting: Hematology and Oncology

## 2013-09-20 ENCOUNTER — Other Ambulatory Visit (HOSPITAL_BASED_OUTPATIENT_CLINIC_OR_DEPARTMENT_OTHER): Payer: BC Managed Care – PPO

## 2013-09-20 ENCOUNTER — Ambulatory Visit: Payer: BC Managed Care – PPO

## 2013-09-20 ENCOUNTER — Ambulatory Visit (HOSPITAL_BASED_OUTPATIENT_CLINIC_OR_DEPARTMENT_OTHER): Payer: BC Managed Care – PPO | Admitting: Hematology and Oncology

## 2013-09-20 VITALS — BP 137/99 | HR 76 | Temp 98.2°F | Resp 20 | Ht 64.0 in | Wt 198.2 lb

## 2013-09-20 DIAGNOSIS — C50412 Malignant neoplasm of upper-outer quadrant of left female breast: Secondary | ICD-10-CM

## 2013-09-20 DIAGNOSIS — C50919 Malignant neoplasm of unspecified site of unspecified female breast: Secondary | ICD-10-CM

## 2013-09-20 DIAGNOSIS — Z170 Estrogen receptor positive status [ER+]: Secondary | ICD-10-CM

## 2013-09-20 DIAGNOSIS — Z803 Family history of malignant neoplasm of breast: Secondary | ICD-10-CM

## 2013-09-20 LAB — CBC WITH DIFFERENTIAL/PLATELET
BASO%: 0.7 % (ref 0.0–2.0)
Basophils Absolute: 0 10*3/uL (ref 0.0–0.1)
EOS ABS: 0.1 10*3/uL (ref 0.0–0.5)
EOS%: 1.6 % (ref 0.0–7.0)
HCT: 36.2 % (ref 34.8–46.6)
HEMOGLOBIN: 11.7 g/dL (ref 11.6–15.9)
LYMPH#: 1.8 10*3/uL (ref 0.9–3.3)
LYMPH%: 45.7 % (ref 14.0–49.7)
MCH: 26.7 pg (ref 25.1–34.0)
MCHC: 32.4 g/dL (ref 31.5–36.0)
MCV: 82.2 fL (ref 79.5–101.0)
MONO#: 0.3 10*3/uL (ref 0.1–0.9)
MONO%: 8 % (ref 0.0–14.0)
NEUT%: 44 % (ref 38.4–76.8)
NEUTROS ABS: 1.7 10*3/uL (ref 1.5–6.5)
Platelets: 381 10*3/uL (ref 145–400)
RBC: 4.4 10*6/uL (ref 3.70–5.45)
RDW: 14.2 % (ref 11.2–14.5)
WBC: 3.9 10*3/uL (ref 3.9–10.3)

## 2013-09-20 LAB — COMPREHENSIVE METABOLIC PANEL (CC13)
ALK PHOS: 70 U/L (ref 40–150)
ALT: 11 U/L (ref 0–55)
AST: 18 U/L (ref 5–34)
Albumin: 3.7 g/dL (ref 3.5–5.0)
Anion Gap: 9 mEq/L (ref 3–11)
BUN: 12.4 mg/dL (ref 7.0–26.0)
CO2: 27 mEq/L (ref 22–29)
Calcium: 9.3 mg/dL (ref 8.4–10.4)
Chloride: 107 mEq/L (ref 98–109)
Creatinine: 0.9 mg/dL (ref 0.6–1.1)
Glucose: 102 mg/dl (ref 70–140)
POTASSIUM: 3.8 meq/L (ref 3.5–5.1)
SODIUM: 142 meq/L (ref 136–145)
TOTAL PROTEIN: 7.5 g/dL (ref 6.4–8.3)
Total Bilirubin: <0.2 mg/dL (ref 0.20–1.20)

## 2013-09-20 NOTE — Progress Notes (Signed)
Checked in new patient with no financial issues prior to seeing the dr. She has appt card and has not been out of the country. I called to let Maudie Mercury know she was here(late). She said he schedule had 8:30 not 8:15 for lab.

## 2013-09-20 NOTE — Progress Notes (Signed)
[facility] CONSULT NOTE  Patient Care Team: Gavin Pound, MD as PCP - General (Family Medicine) Shann Medal, MD as Consulting Physician (General Surgery) Thea Silversmith, MD as Consulting Physician (Radiation Oncology) Gavin Pound, MD as Consulting Physician (Family Medicine)  CHIEF COMPLAINTS/PURPOSE OF CONSULTATION:  Newly diagnosed breast cancer  HISTORY OF PRESENTING ILLNESS:  Jean [name] 53 y.o. female is here because of recent diagnosis of left breast cancer. 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 Patient was evaluated with an MRI that revealed a 2.1 cm abnormality in the left breast. She was then taken to surgery and she underwent left lumpectomy on 08/30/2013 and sentinel lymph node study. 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 Surprisingly HER-2 was amplified heterogeneously throughout lumpectomy specimen with a HER-2/CEP 17 ratio of 2.3. The Oncotype DX performed on the original biopsy came back as low risk. The score was 17. However this is probably unreliable given the fact that her tumor was HER-2 amplified. She is recovering very well from the surgery still has some mild tenderness. She is off work currently. She is here by herself.   I reviewed her records extensively and collaborated the history with the patient.  SUMMARY OF ONCOLOGIC HISTORY:   Breast cancer of upper-outer quadrant of left female breast   07/22/2013 Mammogram 1.8 cm mass with mammographic and ultrasound features suspicious for malignancy in the 9 o'clock position of the left breast   08/12/2013 Initial Diagnosis Breast cancer of upper-outer quadrant of left female breast.  invasive  ductal carcinoma plus DCIS.  The cancer was grade 2 and ER positive at 100%, PR+ at 91% and HER2- with a Ki67 of 47%   08/12/2013 Breast MRI 2.1 x 1.2 x 1.2 cm biopsy-proven invasive ductal carcinoma and ductal carcinoma in situ in the 9 o'clock position of the left breast   08/30/2013 Surgery Left Lumpectomy IDC with infiltrating lobular features. Pos for LVSI; 1 mm from nearest margin, DCIS 1 SLN Neg; ONCOTYPE Dx Rec score 17, 10 yr risk of recurrance is 11% with Tamoxifen alone; HER2 Amplified Ratio 2.3    In terms of breast cancer risk profile:  She menarched at early age of 71 and went to menopause at age ? (hysterectomy at age on 59  She had 2 pregnancy, her first child was born at age 36 She has family history of Breast/ and GI cancer  MEDICAL HISTORY:  Past Medical History  Diagnosis Date  . Arthritis     back  . GERD (gastroesophageal reflux disease)     no current med.  . Breast cancer 08/2013    left  . Dental crown present     SURGICAL HISTORY: Past Surgical History  Procedure Laterality Date  . Foot surgery Right   . Abdominal hysterectomy  12/16/2007    SOCIAL HISTORY: History   Social History  . Marital Status: Divorced    Spouse Name: N/A    Number of Children: N/A  . Years of Education: N/A   Occupational History  . Not on file.   Social History Main Topics  . Smoking status: Former Smoker -- 0.25 packs/day for 2.5 years    Types: Cigarettes    Quit date: 02/03/2011  . Smokeless tobacco: Never  Used  . Alcohol Use: Yes     Comment: occasionally  . Drug Use: No  . Sexual Activity: Yes   Other Topics Concern  . Not on file   Social History Narrative  . No narrative on file    FAMILY HISTORY: Family History  Problem Relation Age of Onset  . Breast cancer Paternal Aunt   . Cervical cancer Paternal Aunt   . Colon cancer Paternal Uncle   . Prostate cancer Paternal Uncle     ALLERGIES:  has No Known Allergies.  MEDICATIONS:  Current  Outpatient Prescriptions  Medication Sig Dispense Refill  . cholecalciferol (VITAMIN D) 1000 UNITS tablet Take 1,000 Units by mouth daily.      Marland Kitchen HYDROcodone-acetaminophen (NORCO/VICODIN) 5-325 MG per tablet Take 1-2 tablets by mouth every 6 (six) hours as needed.  30 tablet  0  . Multiple Vitamin (MULTIVITAMIN) tablet Take 1 tablet by mouth daily.      Marland Kitchen zolpidem (AMBIEN) 5 MG tablet Take 5 mg by mouth at bedtime as needed for sleep.       No current facility-administered medications for this visit.    REVIEW OF SYSTEMS:   Constitutional: Denies fevers, chills or abnormal night sweats Eyes: Denies blurriness of vision, double vision or watery eyes Ears, nose, mouth, throat, and face: Denies mucositis or sore throat Respiratory: Denies cough, dyspnea or wheezes Cardiovascular: Denies palpitation, chest discomfort or lower extremity swelling Gastrointestinal:  Denies nausea, heartburn or change in bowel habits Skin: Denies abnormal skin rashes Lymphatics: Denies new lymphadenopathy or easy bruising Neurological:Denies numbness, tingling or new weaknesses Behavioral/Psych: Mood is stable, no new changes  Breast: Left breast still painful from recent surgery All other systems were reviewed with the patient and are negative.  PHYSICAL EXAMINATION: ECOG PERFORMANCE STATUS: 1 - Symptomatic but completely ambulatory  Filed Vitals:   09/20/13 0846  BP: 137/99  Pulse: 76  Temp: 98.2 F (36.8 C)  Resp: 20   Filed Weights   09/20/13 0846  Weight: 198 lb 3.2 oz (89.903 kg)    GENERAL:alert, no distress and comfortable SKIN: skin color, texture, turgor are normal, no rashes or significant lesions EYES: normal, conjunctiva are pink and non-injected, sclera clear OROPHARYNX:no exudate, no erythema and lips, buccal mucosa, and tongue normal  NECK: supple, thyroid normal size, non-tender, without nodularity LYMPH:  no palpable lymphadenopathy in the cervical, axillary or inguinal LUNGS:  clear to auscultation and percussion with normal breathing effort HEART: regular rate & rhythm and no murmurs and no lower extremity edema ABDOMEN:abdomen soft, non-tender and normal bowel sounds Musculoskeletal:no cyanosis of digits and no clubbing  PSYCH: alert & oriented x 3 with fluent speech NEURO: no focal motor/sensory deficits BREAST: Palpation of the right breast did not reveal any nodules lumps or lymph nodes. Palpation of the left breast was limited because of the recent surgery and tenderness but the scars appear to be healing very well.  LABORATORY DATA:  I have reviewed the data as listed Lab Results  Component Value Date   WBC 3.9 09/20/2013   HGB 11.7 09/20/2013   HCT 36.2 09/20/2013   MCV 82.2 09/20/2013   PLT 381 09/20/2013   Lab Results  Component Value Date   NA 142 09/20/2013   K 3.8 09/20/2013   CL 105 12/17/2007   CO2 27 09/20/2013    RADIOGRAPHIC STUDIES: I have personally reviewed the radiological reports and agreed with the findings in the report.  ASSESSMENT AND PLAN:  Breast cancer of  upper-outer quadrant of left female breast 1. 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 N0 M0 stage I ER 100% PR positive HER-2 amplified ratio is 2.3. I reviewed the pathology in great detail including the fact that on the initial biopsy and HER-2 was negative. The Oncotype DX) initial biopsy was noticed with a recurrence score of 17 but on the final lumpectomy specimen there was heterogeneous HER-2 expression with her to/CEP 17 ratio of 2.3 suggesting that she has HER-2 amplified heterogeneously throughout the breast cancer. It is for this reason that I am recommending adjuvant systemic chemotherapy with Taxol Herceptin weekly x12.  2. I discussed the risks and benefits of chemotherapy with Taxol including the risk of mild hair loss mild nausea mild infection risk risk of neuropathy skin and nail changes taste changes. I also discussed risk of Herceptin  given the decrease in ejection fraction the heart.  3. I would like to obtain echocardiogram as a baseline request Dr. Lucia Gaskins for a port placement as well as schedule her for chemotherapy education. I would like to schedule her chemotherapy to start September 1.  4. Overall treatment plan: Taxol Herceptin weekly x12 followed by radiation plus Herceptin q. 3 weeks of 42 weeks to complete monthly of Herceptin treatment after radiation patient will stop antiestrogen therapy based on her menopausal status. I would like to check [facility] and estradiol today. If she is postmenopausal I would recommend giving aromatase inhibitor therapy if she is premenopausal we will initiate tamoxifen. This. After the conclusion of radiation therapy.  I provided her with literature on chemotherapy as well as antiestrogen therapy and she will return back to see [place] in September 1 prior to starting chemotherapy to sign consent forms and to initiate treatment.   All questions were answered. The patient knows to call the clinic with any problems, questions or concerns. I spent 60 minutes counseling the patient face to face. The total time spent in the appointment was 80 minutes and more than 50% was on counseling.     Rulon Eisenmenger, MD 09/20/2013 10:12 AM

## 2013-09-20 NOTE — Assessment & Plan Note (Signed)
1. 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 N0 M0 stage I ER 100% PR positive HER-2 amplified ratio is 2.3. I reviewed the pathology in great detail including the fact that on the initial biopsy and HER-2 was negative. The Oncotype DX) initial biopsy was noticed with a recurrence score of 17 but on the final lumpectomy specimen there was heterogeneous HER-2 expression with her to/CEP 17 ratio of 2.3 suggesting that she has HER-2 amplified heterogeneously throughout the breast cancer. It is for this reason that I am recommending adjuvant systemic chemotherapy with Taxol Herceptin weekly x12.  2. I discussed the risks and benefits of chemotherapy with Taxol including the risk of mild hair loss mild nausea mild infection risk risk of neuropathy skin and nail changes taste changes. I also discussed risk of Herceptin given the decrease in ejection fraction the heart.  3. I would like to obtain echocardiogram as a baseline request Dr. Lucia Gaskins for a port placement as well as schedule her for chemotherapy education. I would like to schedule her chemotherapy to start September 1.  4. Overall treatment plan: Taxol Herceptin weekly x12 followed by radiation plus Herceptin q. 3 weeks of 42 weeks to complete monthly of Herceptin treatment after radiation patient will stop antiestrogen therapy based on her menopausal status. I would like to check [facility] and estradiol today. If she is postmenopausal I would recommend giving aromatase inhibitor therapy if she is premenopausal we will initiate tamoxifen. This. After the conclusion of radiation therapy.  I provided her with literature on chemotherapy as well as antiestrogen therapy and she will return back to see [place] in September 1 prior to starting chemotherapy to sign consent forms and to initiate treatment.

## 2013-09-20 NOTE — Progress Notes (Signed)
Notes made by Dr Lindi Adie during Office Visit sent to scan.  Copy to patient

## 2013-09-21 ENCOUNTER — Ambulatory Visit (INDEPENDENT_AMBULATORY_CARE_PROVIDER_SITE_OTHER): Payer: BC Managed Care – PPO | Admitting: Surgery

## 2013-09-21 ENCOUNTER — Encounter (HOSPITAL_COMMUNITY): Payer: Self-pay | Admitting: *Deleted

## 2013-09-21 VITALS — BP 140/100 | HR 78 | Temp 98.4°F | Ht 66.0 in | Wt 198.1 lb

## 2013-09-21 DIAGNOSIS — C50412 Malignant neoplasm of upper-outer quadrant of left female breast: Secondary | ICD-10-CM

## 2013-09-21 DIAGNOSIS — C50419 Malignant neoplasm of upper-outer quadrant of unspecified female breast: Secondary | ICD-10-CM

## 2013-09-21 LAB — FOLLICLE STIMULATING HORMONE: FSH: 60.1 m[IU]/mL

## 2013-09-21 NOTE — Progress Notes (Addendum)
Re:   Jean [name] DOB:   Jan 31, 1961 MRN:   [id number]  Breast MDC  ASSESSMENT AND PLAN: 1.  Left breast cancer, 9 o'clock, T2, N0  Biopsy - 08/09/2013 - Path ([id number]) - Invasive ductal ca with lymph vascular invasion, grade 2/3, ER - 100%, PR - 91%, Ki67 - 47%, Her2Neu - Neg  Oncology - Dr. Pablo Ledger (no med onc)  Has seen Dr. Lindi Adie - plans chemotx/herceptin x 1 year  Left lumpectomy/SLNBx - 08/30/2013 ([id number]) - IDC with lobular features, 1 mm from inferior margin, 0/1 node, Her2Neu - positive  Looks good - I'll see her in 6 months after the power port is placed. [She saw Dr. Lindi Adie.  She plans chemo.  Overall treatment plan: Taxol Herceptin weekly x12 followed by radiation plus Herceptin q. 3 weeks of 42 weeks to complete monthly of Herceptin treatment. I have her on for port placement 8/24.  DN 09/25/2013]  2.  For power port  Discussed indications and complications of the procedure.  Risks include bleeding, infection, and pneumothorax. 2. Chronic back pain  Chief Complaint  Patient presents with  . Routine Post Op   REFERRING PHYSICIAN: NNODI, ADAKU, MD  HISTORY OF PRESENT ILLNESS: Jean [name] is a 53 y.o. (DOB: [birth year])  AA  female whose primary care physician is Jean, ADAKU, MD (she sees Dr. Loni [name]. Jean as her PCP) and comes for follow up of left breast lumpectomy. Comes by self. She saw Dr. Lindi Adie. Her breast is sore, particularly without a bra.  But she is doing well.  History of Breast Cancer She is accompanied with her fiancee, Jean [name].  She noticed no change in her breasts.  Her last period was 2010.  She had a hysterectomy for fibroids by Dr. Alvin [name].  She is on no hormones.  She has an aunt (father's sister) who had breast cancer.  Her last mammogram was April 2014.  Mammogram and [place] - 08/01/2013 - 1.8 cm mass with mammographic and ultrasound features suspicious for malignancy in the 9 o'clock position of the left breast. The recently suspected right breast  masses represented overlapping of normal tissue.  MRI - 7/102015 - 1. 2.1 x 1.2 x 1.2 cm biopsy-proven invasive ductal carcinoma and ductal carcinoma in situ in the 9 o'clock position of the left breast. 2. Normal central, lateral right breast intramammary lymph node with no evidence of malignancy on the right.  Biopsy - 08/09/2013 - Path [id number]) - Invasive ductal ca with lymph vascular invasion, grade 2/3, ER - 100%, PR - 91%, Ki67 - 47%, Her2Neu - Neg    Past Medical History  Diagnosis Date  . Arthritis     back  . GERD (gastroesophageal reflux disease)     no current med.  . Breast cancer 08/2013    left  . Dental crown present       Past Surgical History  Procedure Laterality Date  . Foot surgery Right   . Abdominal hysterectomy  12/16/2007     Current Outpatient Prescriptions  Medication Sig Dispense Refill  . cholecalciferol (VITAMIN Jean) 1000 UNITS tablet Take 1,000 Units by mouth daily.      Marland Kitchen HYDROcodone-acetaminophen (NORCO/VICODIN) 5-325 MG per tablet Take 1-2 tablets by mouth every 6 (six) hours as needed.  30 tablet  0  . Multiple Vitamin (MULTIVITAMIN) tablet Take 1 tablet by mouth daily.      Marland Kitchen zolpidem (AMBIEN) 5 MG tablet Take 5 mg by mouth at bedtime as needed for sleep.  No current facility-administered medications for this visit.    No Known Allergies  REVIEW OF SYSTEMS: Gastrointestinal:  No history of stomach disease.  No history of liver disease.  No history of gall bladder disease.  No history of pancreas disease.  Colonoscopy 2014 by [facility].  She cannot remember the name of the GI doctor. Urologic:  No history of kidney stones.  No history of bladder infections. Musculoskeletal:  Chronic back pain.  She is not seeing anyone for this.   SOCIAL and FAMILY HISTORY: She is accompanied with her Stark Bray. She works at [employer] at [employer] She has two boys:  33 and 33.  PHYSICAL EXAM: BP 140/100  Pulse 78  Temp(Src) 98.4  F (36.9 C) (Oral)  Ht _0  (1.676 m)  Wt 198 lb 2 oz (89.869 kg)  BMI 31.99 kg/m2  General: WN AA F who is alert and generally healthy appearing.  HEENT: Normal. Pupils equal. Neck: Supple. No mass.  No thyroid mass. Lymph Nodes:  No supraclavicular, cervical, or axillary nodes. Breasts:  Right - no mass  Left - Incision at 9 o'clock looks good.  Left axillary wound looks good.  DATA REVIEWED: Epic notes  [name], MD,  [facility], [address]  [place], [place]    [place] Phone:  [phone] FAX:  [phone]

## 2013-09-21 NOTE — Progress Notes (Signed)
Surgery 09-26-13, Same Day, please put orders in Epic

## 2013-09-22 ENCOUNTER — Ambulatory Visit: Payer: BC Managed Care – PPO

## 2013-09-22 ENCOUNTER — Ambulatory Visit
Admission: RE | Admit: 2013-09-22 | Payer: BC Managed Care – PPO | Source: Ambulatory Visit | Admitting: Radiation Oncology

## 2013-09-22 NOTE — Progress Notes (Signed)
Surgery 09-26-13 Same Day, please put orders in Epic Thanks

## 2013-09-25 ENCOUNTER — Other Ambulatory Visit (INDEPENDENT_AMBULATORY_CARE_PROVIDER_SITE_OTHER): Payer: Self-pay | Admitting: Surgery

## 2013-09-26 ENCOUNTER — Encounter (HOSPITAL_COMMUNITY): Payer: BC Managed Care – PPO | Admitting: Anesthesiology

## 2013-09-26 ENCOUNTER — Ambulatory Visit (HOSPITAL_COMMUNITY): Payer: BC Managed Care – PPO

## 2013-09-26 ENCOUNTER — Ambulatory Visit (HOSPITAL_COMMUNITY): Payer: BC Managed Care – PPO | Admitting: Anesthesiology

## 2013-09-26 ENCOUNTER — Telehealth: Payer: Self-pay | Admitting: *Deleted

## 2013-09-26 ENCOUNTER — Encounter (HOSPITAL_COMMUNITY): Payer: Self-pay | Admitting: *Deleted

## 2013-09-26 ENCOUNTER — Ambulatory Visit (HOSPITAL_COMMUNITY)
Admission: RE | Admit: 2013-09-26 | Discharge: 2013-09-26 | Disposition: A | Discharge status: Home / Self Care | Payer: BC Managed Care – PPO | Source: Ambulatory Visit | Attending: Surgery | Admitting: Surgery

## 2013-09-26 ENCOUNTER — Encounter (HOSPITAL_COMMUNITY)
Admission: RE | Disposition: A | Discharge status: Home / Self Care | Payer: Self-pay | Source: Ambulatory Visit | Attending: Surgery

## 2013-09-26 DIAGNOSIS — M549 Dorsalgia, unspecified: Secondary | ICD-10-CM | POA: Insufficient documentation

## 2013-09-26 DIAGNOSIS — Z87891 Personal history of nicotine dependence: Secondary | ICD-10-CM | POA: Diagnosis not present

## 2013-09-26 DIAGNOSIS — Z79899 Other long term (current) drug therapy: Secondary | ICD-10-CM | POA: Insufficient documentation

## 2013-09-26 DIAGNOSIS — C50412 Malignant neoplasm of upper-outer quadrant of left female breast: Secondary | ICD-10-CM

## 2013-09-26 DIAGNOSIS — G8929 Other chronic pain: Secondary | ICD-10-CM | POA: Insufficient documentation

## 2013-09-26 DIAGNOSIS — C50919 Malignant neoplasm of unspecified site of unspecified female breast: Secondary | ICD-10-CM | POA: Insufficient documentation

## 2013-09-26 HISTORY — PX: PORTACATH PLACEMENT: SHX2246

## 2013-09-26 SURGERY — INSERTION, TUNNELED CENTRAL VENOUS DEVICE, WITH PORT
Anesthesia: General | Site: Chest | Laterality: Right

## 2013-09-26 MED ORDER — SODIUM CHLORIDE 0.9 % IR SOLN
Status: DC | PRN
  Administered 2013-09-26: 1000 mL

## 2013-09-26 MED ORDER — MIDAZOLAM HCL 2 MG/2ML IJ SOLN
INTRAMUSCULAR | Status: AC
  Filled 2013-09-26: qty 2

## 2013-09-26 MED ORDER — CEFAZOLIN SODIUM-DEXTROSE 2-3 GM-% IV SOLR
INTRAVENOUS | Status: AC
  Filled 2013-09-26: qty 50

## 2013-09-26 MED ORDER — FENTANYL CITRATE 0.05 MG/ML IJ SOLN
INTRAMUSCULAR | Status: AC
  Filled 2013-09-26: qty 2

## 2013-09-26 MED ORDER — FENTANYL CITRATE 0.05 MG/ML IJ SOLN
INTRAMUSCULAR | Status: DC | PRN
  Administered 2013-09-26 (×2): 50 ug via INTRAVENOUS

## 2013-09-26 MED ORDER — DEXAMETHASONE SODIUM PHOSPHATE 10 MG/ML IJ SOLN
INTRAMUSCULAR | Status: DC | PRN
  Administered 2013-09-26: 10 mg via INTRAVENOUS

## 2013-09-26 MED ORDER — HEPARIN SOD (PORK) LOCK FLUSH 100 UNIT/ML IV SOLN
INTRAVENOUS | Status: AC
  Filled 2013-09-26: qty 5

## 2013-09-26 MED ORDER — BUPIVACAINE-EPINEPHRINE (PF) 0.25% -1:200000 IJ SOLN
INTRAMUSCULAR | Status: AC
  Filled 2013-09-26: qty 30

## 2013-09-26 MED ORDER — PROPOFOL 10 MG/ML IV BOLUS
INTRAVENOUS | Status: AC
  Filled 2013-09-26: qty 20

## 2013-09-26 MED ORDER — CHLORHEXIDINE GLUCONATE 4 % EX LIQD: 1.0000 "application " | Freq: Once | CUTANEOUS | Status: DC

## 2013-09-26 MED ORDER — LIDOCAINE HCL (CARDIAC) 20 MG/ML IV SOLN
INTRAVENOUS | Status: DC | PRN
  Administered 2013-09-26: 100 mg via INTRAVENOUS

## 2013-09-26 MED ORDER — CEFAZOLIN SODIUM-DEXTROSE 2-3 GM-% IV SOLR
2.0000 g | INTRAVENOUS | Status: AC
  Administered 2013-09-26: 2 g via INTRAVENOUS

## 2013-09-26 MED ORDER — HEPARIN SOD (PORK) LOCK FLUSH 100 UNIT/ML IV SOLN
INTRAVENOUS | Status: DC | PRN
  Administered 2013-09-26: 400 [IU] via INTRAVENOUS

## 2013-09-26 MED ORDER — SODIUM CHLORIDE 0.9 % IR SOLN
Freq: Once | Status: AC
  Administered 2013-09-26: 16:00:00
  Filled 2013-09-26: qty 1.2

## 2013-09-26 MED ORDER — ONDANSETRON HCL 4 MG/2ML IJ SOLN
INTRAMUSCULAR | Status: AC
  Filled 2013-09-26: qty 2

## 2013-09-26 MED ORDER — ONDANSETRON HCL 4 MG/2ML IJ SOLN
INTRAMUSCULAR | Status: DC | PRN
  Administered 2013-09-26: 4 mg via INTRAVENOUS

## 2013-09-26 MED ORDER — OXYCODONE-ACETAMINOPHEN 5-325 MG PO TABS
1.0000 | ORAL_TABLET | Freq: Four times a day (QID) | ORAL | Status: DC | PRN
Start: 2013-09-26 — End: 2013-09-26
  Administered 2013-09-26: 1 via ORAL
  Filled 2013-09-26: qty 1

## 2013-09-26 MED ORDER — PROPOFOL 10 MG/ML IV BOLUS
INTRAVENOUS | Status: DC | PRN
  Administered 2013-09-26: 200 mg via INTRAVENOUS

## 2013-09-26 MED ORDER — FENTANYL CITRATE 0.05 MG/ML IJ SOLN
25.0000 ug | INTRAMUSCULAR | Status: DC | PRN
Start: 2013-09-26 — End: 2013-09-26
  Administered 2013-09-26 (×2): 50 ug via INTRAVENOUS

## 2013-09-26 MED ORDER — PROMETHAZINE HCL 25 MG/ML IJ SOLN: 6.2500 mg | INTRAMUSCULAR | Status: DC | PRN

## 2013-09-26 MED ORDER — LIDOCAINE HCL (CARDIAC) 20 MG/ML IV SOLN
INTRAVENOUS | Status: AC
  Filled 2013-09-26: qty 5

## 2013-09-26 MED ORDER — LACTATED RINGERS IV SOLN
INTRAVENOUS | Status: DC
  Administered 2013-09-26: 1000 mL via INTRAVENOUS

## 2013-09-26 MED ORDER — BUPIVACAINE HCL (PF) 0.25 % IJ SOLN
INTRAMUSCULAR | Status: AC
  Filled 2013-09-26: qty 30

## 2013-09-26 MED ORDER — OXYCODONE-ACETAMINOPHEN 10-325 MG PO TABS: 1.0000 | ORAL_TABLET | ORAL | Status: DC | PRN

## 2013-09-26 MED ORDER — BUPIVACAINE HCL 0.25 % IJ SOLN
INTRAMUSCULAR | Status: DC | PRN
  Administered 2013-09-26: 5 mL

## 2013-09-26 MED ORDER — MIDAZOLAM HCL 5 MG/5ML IJ SOLN
INTRAMUSCULAR | Status: DC | PRN
  Administered 2013-09-26 (×2): 1 mg via INTRAVENOUS

## 2013-09-26 MED ORDER — OXYCODONE HCL 5 MG PO TABS
5.0000 mg | ORAL_TABLET | Freq: Four times a day (QID) | ORAL | Status: DC | PRN
  Administered 2013-09-26: 5 mg via ORAL
  Filled 2013-09-26: qty 1

## 2013-09-26 SURGICAL SUPPLY — 36 items
BAG DECANTER FOR FLEXI CONT (MISCELLANEOUS) ×3 IMPLANT
BENZOIN TINCTURE PRP APPL 2/3 (GAUZE/BANDAGES/DRESSINGS) ×3 IMPLANT
BLADE HEX COATED 2.75 (ELECTRODE) ×3 IMPLANT
BLADE SURG 15 STRL LF DISP TIS (BLADE) ×1 IMPLANT
BLADE SURG 15 STRL SS (BLADE) ×2
CHLORAPREP W/TINT 10.5 ML (MISCELLANEOUS) ×3 IMPLANT
CLOSURE STERI-STRIP 1/4X4 (GAUZE/BANDAGES/DRESSINGS) ×3 IMPLANT
CLOSURE WOUND 1/4X4 (GAUZE/BANDAGES/DRESSINGS) ×1
DECANTER SPIKE VIAL GLASS SM (MISCELLANEOUS) ×3 IMPLANT
DRAPE C-ARM 42X120 X-RAY (DRAPES) ×3 IMPLANT
DRAPE LAPAROTOMY TRNSV 102X78 (DRAPE) ×3 IMPLANT
ELECT REM PT RETURN 9FT ADLT (ELECTROSURGICAL) ×3
ELECTRODE REM PT RTRN 9FT ADLT (ELECTROSURGICAL) ×1 IMPLANT
GAUZE SPONGE 2X2 8PLY STRL LF (GAUZE/BANDAGES/DRESSINGS) ×1 IMPLANT
GAUZE SPONGE 4X4 12PLY STRL (GAUZE/BANDAGES/DRESSINGS) ×6 IMPLANT
GAUZE SPONGE 4X4 16PLY XRAY LF (GAUZE/BANDAGES/DRESSINGS) ×3 IMPLANT
GLOVE SURG SIGNA 7.5 PF LTX (GLOVE) ×3 IMPLANT
GOWN SPEC L4 XLG W/TWL (GOWN DISPOSABLE) ×3 IMPLANT
GOWN STRL REUS W/ TWL XL LVL3 (GOWN DISPOSABLE) ×3 IMPLANT
GOWN STRL REUS W/TWL XL LVL3 (GOWN DISPOSABLE) ×6
KIT BASIN OR (CUSTOM PROCEDURE TRAY) ×3 IMPLANT
KIT PORT POWER 8FR ISP CVUE (Catheter) ×3 IMPLANT
KIT POWER CATH 8FR (Catheter) IMPLANT
NEEDLE HYPO 25X1 1.5 SAFETY (NEEDLE) ×3 IMPLANT
NS IRRIG 1000ML POUR BTL (IV SOLUTION) ×3 IMPLANT
PACK BASIC VI WITH GOWN DISP (CUSTOM PROCEDURE TRAY) ×3 IMPLANT
PENCIL BUTTON HOLSTER BLD 10FT (ELECTRODE) ×3 IMPLANT
SPONGE GAUZE 2X2 STER 10/PKG (GAUZE/BANDAGES/DRESSINGS) ×2
STRIP CLOSURE SKIN 1/4X4 (GAUZE/BANDAGES/DRESSINGS) ×2 IMPLANT
SUT VIC AB 3-0 SH 18 (SUTURE) ×3 IMPLANT
SUT VIC AB 5-0 PS2 18 (SUTURE) ×3 IMPLANT
SYR BULB IRRIGATION 50ML (SYRINGE) ×3 IMPLANT
SYRINGE 12CC LL (MISCELLANEOUS) ×3 IMPLANT
SYRINGE 20CC LL (MISCELLANEOUS) ×3 IMPLANT
TAPE CLOTH SURG 4X10 WHT LF (GAUZE/BANDAGES/DRESSINGS) ×3 IMPLANT
TOWEL OR 17X26 10 PK STRL BLUE (TOWEL DISPOSABLE) ×3 IMPLANT

## 2013-09-26 NOTE — Anesthesia Postprocedure Evaluation (Signed)
  Anesthesia Post-op Note  Patient: Jean [name]  Procedure(s) Performed: Procedure(s) (LRB): INSERTION PORT-A-CATH (Right)  Patient Location: PACU  Anesthesia Type: General  Level of Consciousness: awake and alert   Airway and Oxygen Therapy: Patient Spontanous Breathing  Post-op Pain: mild  Post-op Assessment: Post-op Vital signs reviewed, Patient's Cardiovascular Status Stable, Respiratory Function Stable, Patent Airway and No signs of Nausea or vomiting  Last Vitals:  Filed Vitals:   09/26/13 1906  BP: 156/103  Pulse: 73  Temp: 36.4 C  Resp: 18    Post-op Vital Signs: stable   Complications: No apparent anesthesia complications

## 2013-09-26 NOTE — H&P (View-Only) (Signed)
 Re:   Jean [name] DOB:   05/13/1960 MRN:   [id number]  Breast MDC  ASSESSMENT AND PLAN: 1.  Left breast cancer, 9 o'clock, T2, N0  Biopsy - 08/09/2013 - Path ([id number]) - Invasive ductal ca with lymph vascular invasion, grade 2/3, ER - 100%, PR - 91%, Ki67 - 47%, Her2Neu - Neg  Oncology - Dr. [place] (no med onc)  Has seen Dr. Gudena - plans chemotx/herceptin x 1 year  Left lumpectomy/SLNBx - 08/30/2013 ([id number]) - IDC with lobular features, 1 mm from inferior margin, 0/1 node, Her2Neu - positive  Looks good - I'll see her in 6 months after the power port is placed. [She saw Dr. Gudena.  She plans chemo.  Overall treatment plan: Taxol Herceptin weekly x12 followed by radiation plus Herceptin q. 3 weeks of 42 weeks to complete monthly of Herceptin treatment. I have her on for port placement 8/24.  DN 09/25/2013]  2.  For power port  Discussed indications and complications of the procedure.  Risks include bleeding, infection, and pneumothorax. 2. Chronic back pain  Chief Complaint  Patient presents with  . Routine Post Op   REFERRING PHYSICIAN: NNODI, ADAKU, MD  HISTORY OF PRESENT ILLNESS: Jean [name] is a 53 y.o. (DOB: 05/08/1960)  AA  female whose primary care physician is NNODI, ADAKU, MD (she sees Dr. A. Nnodi as her PCP) and comes for follow up of left breast lumpectomy. Comes by self. She saw Dr. Gudena. Her breast is sore, particularly without a bra.  But she is doing well.  History of Breast Cancer She is accompanied with her fiancee, Thomas McCray.  She noticed no change in her breasts.  Her last period was 2010.  She had a hysterectomy for fibroids by Dr. Misenger.  She is on no hormones.  She has an aunt (father's sister) who had breast cancer.  Her last mammogram was April 2014.  Mammogram and US - 08/01/2013 - 1.8 cm mass with mammographic and ultrasound features suspicious for malignancy in the 9 o'clock position of the left breast. The recently suspected right breast  masses represented overlapping of normal tissue.  MRI - 7/102015 - 1. 2.1 x 1.2 x 1.2 cm biopsy-proven invasive ductal carcinoma and ductal carcinoma in situ in the 9 o'clock position of the left breast. 2. Normal central, lateral right breast intramammary lymph node with no evidence of malignancy on the right.  Biopsy - 08/09/2013 - Path ([id number]) - Invasive ductal ca with lymph vascular invasion, grade 2/3, ER - 100%, PR - 91%, Ki67 - 47%, Her2Neu - Neg    Past Medical History  Diagnosis Date  . Arthritis     back  . GERD (gastroesophageal reflux disease)     no current med.  . Breast cancer 08/2013    left  . Dental crown present       Past Surgical History  Procedure Laterality Date  . Foot surgery Right   . Abdominal hysterectomy  12/16/2007     Current Outpatient Prescriptions  Medication Sig Dispense Refill  . cholecalciferol (VITAMIN Jean) 1000 UNITS tablet Take 1,000 Units by mouth daily.      . HYDROcodone-acetaminophen (NORCO/VICODIN) 5-325 MG per tablet Take 1-2 tablets by mouth every 6 (six) hours as needed.  30 tablet  0  . Multiple Vitamin (MULTIVITAMIN) tablet Take 1 tablet by mouth daily.      . zolpidem (AMBIEN) 5 MG tablet Take 5 mg by mouth at bedtime as needed for sleep.         No current facility-administered medications for this visit.    No Known Allergies  REVIEW OF SYSTEMS: Gastrointestinal:  No history of stomach disease.  No history of liver disease.  No history of gall bladder disease.  No history of pancreas disease.  Colonoscopy 2014 by [facility].  She cannot remember the name of the GI doctor. Urologic:  No history of kidney stones.  No history of bladder infections. Musculoskeletal:  Chronic back pain.  She is not seeing anyone for this.   SOCIAL and FAMILY HISTORY: She is accompanied with her fiancee, Thomas McCray. She works at manufacturing at [employer] She has two boys:  30 and 33.  PHYSICAL EXAM: BP 140/100  Pulse 78  Temp(Src) 98.4  F (36.9 C) (Oral)  Ht 5' 6" (1.676 m)  Wt 198 lb 2 oz (89.869 kg)  BMI 31.99 kg/m2  General: WN AA F who is alert and generally healthy appearing.  HEENT: Normal. Pupils equal. Neck: Supple. No mass.  No thyroid mass. Lymph Nodes:  No supraclavicular, cervical, or axillary nodes. Breasts:  Right - no mass  Left - Incision at 9 o'clock looks good.  Left axillary wound looks good.  DATA REVIEWED: Epic notes  Lauralee Waters, MD,  FACS [facility] [address]  [address]   [place], [place]    [address] Phone:  [phone] FAX:  [phone]  

## 2013-09-26 NOTE — Interval H&P Note (Signed)
History and Physical Interval Note:  09/26/2013 3:44 PM  Jean [name]  has presented today for surgery, with the diagnosis of breast cancer  The various methods of treatment have been discussed with the patient and family.  Stark Bray, with patient.   After consideration of risks, benefits and other options for treatment, the patient has consented to  Procedure(s): INSERTION PORT-A-CATH (N/A) as a surgical intervention .  The patient's history has been reviewed, patient examined, no change in status, stable for surgery.  I have reviewed the patient's chart and labs.  Questions were answered to the patient's satisfaction.     Glendene Wyer H

## 2013-09-26 NOTE — Transfer of Care (Signed)
Immediate Anesthesia Transfer of Care Note  Patient: Jean [name]  Procedure(s) Performed: Procedure(s): INSERTION PORT-A-CATH (Right)  Patient Location: PACU  Anesthesia Type:General  Level of Consciousness: awake, alert  and oriented  Airway & Oxygen Therapy: Patient Spontanous Breathing and Patient connected to face mask oxygen  Post-op Assessment: Report given to PACU RN and Post -op Vital signs reviewed and stable  Post vital signs: Reviewed and stable  Complications: No apparent anesthesia complications

## 2013-09-26 NOTE — Discharge Instructions (Signed)
[facility] - DISCHARGE INSTRUCTIONS TO PATIENT  Activity:  Driving - May drive in one or 2 days, if doing well.   Lifting - take it easy for 2 or 3 days, then no limit  Wound Care:   Leave the incision dry for 2 days (until Wednesday), then remove bandage and shower  Diet:  As tolerated  Follow up appointment:  Call Dr. Pollie Friar office [facility]) at [phone] for an appointment in 3 or 4 weeks.  Medications and dosages:  Resume your home medications.  You have a prescription for:  Percocet  Call Dr. Lucia Gaskins or his office  [phone]) if you have:  Temperature greater than 100.4,  Persistent nausea and vomiting,  Severe uncontrolled pain,  Redness, tenderness, or signs of infection (pain, swelling, redness, odor or green/yellow discharge around the site),  Difficulty breathing, headache or visual disturbances,  Any other questions or concerns you may have after discharge.  In an emergency, call 911 or go to an Emergency Department at a nearby hospital.     General Anesthesia, Care After Refer to this sheet in the next few weeks. These instructions provide you with information on caring for yourself after your procedure. Your health care provider may also give you more specific instructions. Your treatment has been planned according to current medical practices, but problems sometimes occur. Call your health care provider if you have any problems or questions after your procedure. WHAT TO EXPECT AFTER THE PROCEDURE After the procedure, it is typical to experience:  Sleepiness.  Nausea and vomiting. HOME CARE INSTRUCTIONS  For the first 24 hours after general anesthesia:  Have a responsible person with you.  Do not drive a car. If you are alone, do not take public transportation.  Do not drink alcohol.  Do not take medicine that has not been prescribed by your health care provider.  Do not sign important papers or make important  decisions.  You may resume a normal diet and activities as directed by your health care provider.  Change bandages (dressings) as directed.  If you have questions or problems that seem related to general anesthesia, call the hospital and ask for the anesthetist or anesthesiologist on call. SEEK MEDICAL CARE IF:  You have nausea and vomiting that continue the day after anesthesia.  You develop a rash. SEEK IMMEDIATE MEDICAL CARE IF:   You have difficulty breathing.  You have chest pain.  You have any allergic problems. Document Released: 04/28/2000 Document Revised: 01/25/2013 Document Reviewed: 08/05/2012 [facility] Patient Information 2015 [place], [place]. This information is not intended to replace advice given to you by your health care provider. Make sure you discuss any questions you have with your health care provider.

## 2013-09-26 NOTE — Op Note (Signed)
09/26/2013  4:56 PM  PATIENT:  Jean [name], 53 y.o., female MRN: [id number] DOB: 12-06-1960  PREOP DIAGNOSIS: left breast cancer, anticipate chemotherapy  POSTOP DIAGNOSIS:   left breast cancer (Her2 Neu positive) , anticipate chemotherapy  PROCEDURE:   Procedure(s): INSERTION PORT-A-CATH, right subclavian  SURGEON:   [name], M.D.  ANESTHESIA:   general  Anesthesiologist: Salley Scarlet, MD CRNA: Sharlette Dense, CRNA  General  EBL:  minimal  ml  COUNTS CORRECT:  YES  INDICATIONS FOR PROCEDURE:  [name] is a 53 y.o. (DOB: 05-04-1960) AA female whose primary care physician is NNODI, ADAKU, MD and comes for power port placement for the treatment of left breast cancer.  Dr. Lindi Adie is her treating oncologist.  Her tumor is Her2Neu positive.   The indications and risks of the surgery were explained to the patient.  The risks include, but are not limited to, infection, bleeding, pneumothorax, nerve injury, and thrombosis of the vein.  OPERATIVE NOTE:  The patient was taken to Room #1 at [facility].  Anesthesia was provided by Anesthesiologist: Salley Scarlet, MD CRNA: Sharlette Dense, CRNA.  At the beginning of the operation, the patient was given 2 gm Ancef, had a roll placed under her back, and had the upper chest/neck prepped with Chloroprep and draped.   A time out was held and the surgery checklist reviewed.   The patient was placed in Trendelenburg position.  The right subclavian vein was accessed with a 16 gauge needle and a guide wire threaded through the needle into the vein.  The position of the wire was checked with fluoroscopy.   I then developed a pocket in the upper inner aspect of the right chest for the port reservoir.  I used the [employer] for venous access.  The reservoir was sewn in place with a 3-0 Vicryl suture.  The reservoir had been flushed with dilute (10 units/cc) heparin.   I then passed the silastic tubing from the reservoir incision  to the subclavian stick site and used the 8 French introducer to pass it into the vein.  The tip of the silastic catheter was position at the junction of the SVC and the right atrium under fluoroscopy.  The silastic catheter was then attached to the port with the bayonet device.     The entire port and tubing were checked with fluoroscopy and then the port was flushed with 4 cc of concentrated heparin (100 units/cc).   The wounds were then closed with 3-0 vicryl subcutaneous sutures and the skin closed with a 5-0 Vicryl suture.  The skin was painted with tincture of benzoin and steri-stripped.   The patient was transferred to the recovery room in good condition.  The sponge and needle count were correct at the end of the case.  A CXR is ordered for port placement and pending at the time of this note.  [name], MD, [facility] Pager: [phone] Office phone:  [phone]

## 2013-09-26 NOTE — Anesthesia Preprocedure Evaluation (Addendum)
Anesthesia Evaluation  Patient identified by MRN, date of birth, ID band Patient awake    Reviewed: Allergy & Precautions, H&P , NPO status , Patient's Chart, lab work & pertinent test results  Airway Mallampati: II TM Distance: >3 FB Neck ROM: Full    Dental no notable dental hx.    Pulmonary former smoker,  breath sounds clear to auscultation  Pulmonary exam normal       Cardiovascular negative cardio ROS  Rhythm:Regular Rate:Normal     Neuro/Psych negative neurological ROS  negative psych ROS   GI/Hepatic Neg liver ROS, GERD-  ,  Endo/Other  negative endocrine ROS  Renal/GU negative Renal ROS  negative genitourinary   Musculoskeletal negative musculoskeletal ROS (+)   Abdominal   Peds negative pediatric ROS (+)  Hematology negative hematology ROS (+)   Anesthesia Other Findings   Reproductive/Obstetrics negative OB ROS                          Anesthesia Physical Anesthesia Plan  ASA: II  Anesthesia Plan: General   Post-op Pain Management:    Induction: Intravenous  Airway Management Planned: LMA  Additional Equipment:   Intra-op Plan:   Post-operative Plan: Extubation in OR  Informed Consent: I have reviewed the patients History and Physical, chart, labs and discussed the procedure including the risks, benefits and alternatives for the proposed anesthesia with the patient or authorized representative who has indicated his/her understanding and acceptance.   Dental advisory given  Plan Discussed with: CRNA  Anesthesia Plan Comments: (Discussed MAC and GA with patient. She prefers general.)       Anesthesia Quick Evaluation

## 2013-09-26 NOTE — Telephone Encounter (Signed)
Per staff message and POF I have scheduled appts. Advised scheduler of appts. JMW  

## 2013-09-27 ENCOUNTER — Encounter (HOSPITAL_COMMUNITY): Payer: Self-pay | Admitting: Surgery

## 2013-09-27 ENCOUNTER — Telehealth: Payer: Self-pay | Admitting: Adult Health

## 2013-09-27 LAB — ESTRADIOL, ULTRA SENS: Estradiol, Ultra Sensitive: 14 pg/mL

## 2013-09-29 ENCOUNTER — Other Ambulatory Visit: Payer: Self-pay

## 2013-09-29 DIAGNOSIS — C50412 Malignant neoplasm of upper-outer quadrant of left female breast: Secondary | ICD-10-CM

## 2013-09-29 NOTE — Progress Notes (Signed)
Per Dr. Lindi Adie, lab and MD appt prior to 9/8 chemo.  POF sent

## 2013-09-30 ENCOUNTER — Ambulatory Visit: Payer: BC Managed Care – PPO

## 2013-09-30 ENCOUNTER — Ambulatory Visit (HOSPITAL_COMMUNITY)
Admission: RE | Admit: 2013-09-30 | Discharge: 2013-09-30 | Disposition: A | Discharge status: Home / Self Care | Payer: BC Managed Care – PPO | Source: Ambulatory Visit | Attending: Adult Health | Admitting: Adult Health

## 2013-09-30 ENCOUNTER — Telehealth: Payer: Self-pay | Admitting: Hematology and Oncology

## 2013-09-30 DIAGNOSIS — C50419 Malignant neoplasm of upper-outer quadrant of unspecified female breast: Secondary | ICD-10-CM | POA: Diagnosis not present

## 2013-09-30 DIAGNOSIS — I519 Heart disease, unspecified: Secondary | ICD-10-CM | POA: Insufficient documentation

## 2013-09-30 DIAGNOSIS — Z87891 Personal history of nicotine dependence: Secondary | ICD-10-CM | POA: Insufficient documentation

## 2013-09-30 DIAGNOSIS — I369 Nonrheumatic tricuspid valve disorder, unspecified: Secondary | ICD-10-CM

## 2013-09-30 DIAGNOSIS — C50412 Malignant neoplasm of upper-outer quadrant of left female breast: Secondary | ICD-10-CM

## 2013-09-30 DIAGNOSIS — M129 Arthropathy, unspecified: Secondary | ICD-10-CM | POA: Insufficient documentation

## 2013-09-30 DIAGNOSIS — Z01818 Encounter for other preprocedural examination: Secondary | ICD-10-CM | POA: Insufficient documentation

## 2013-09-30 DIAGNOSIS — K219 Gastro-esophageal reflux disease without esophagitis: Secondary | ICD-10-CM | POA: Insufficient documentation

## 2013-09-30 MED ORDER — LIDOCAINE-PRILOCAINE 2.5-2.5 % EX CREA: 1.0000 "application " | TOPICAL_CREAM | CUTANEOUS | Status: DC | PRN

## 2013-09-30 NOTE — Progress Notes (Signed)
*  PRELIMINARY RESULTS* Echocardiogram 2D Echocardiogram has been performed.  [name] 09/30/2013, 1:01 PM 

## 2013-10-04 ENCOUNTER — Other Ambulatory Visit: Payer: Self-pay

## 2013-10-04 ENCOUNTER — Ambulatory Visit (HOSPITAL_BASED_OUTPATIENT_CLINIC_OR_DEPARTMENT_OTHER): Payer: BC Managed Care – PPO | Admitting: Adult Health

## 2013-10-04 ENCOUNTER — Ambulatory Visit (HOSPITAL_BASED_OUTPATIENT_CLINIC_OR_DEPARTMENT_OTHER): Payer: BC Managed Care – PPO

## 2013-10-04 ENCOUNTER — Encounter: Payer: Self-pay | Admitting: Adult Health

## 2013-10-04 ENCOUNTER — Telehealth: Payer: Self-pay | Admitting: Adult Health

## 2013-10-04 ENCOUNTER — Telehealth: Payer: Self-pay | Admitting: Hematology and Oncology

## 2013-10-04 VITALS — BP 152/88 | HR 80 | Temp 98.4°F | Resp 18 | Ht 66.0 in | Wt 198.0 lb

## 2013-10-04 VITALS — BP 140/109 | HR 72 | Temp 98.6°F | Resp 18

## 2013-10-04 DIAGNOSIS — C50412 Malignant neoplasm of upper-outer quadrant of left female breast: Secondary | ICD-10-CM

## 2013-10-04 DIAGNOSIS — C50419 Malignant neoplasm of upper-outer quadrant of unspecified female breast: Secondary | ICD-10-CM

## 2013-10-04 DIAGNOSIS — C50919 Malignant neoplasm of unspecified site of unspecified female breast: Secondary | ICD-10-CM

## 2013-10-04 DIAGNOSIS — Z5112 Encounter for antineoplastic immunotherapy: Secondary | ICD-10-CM

## 2013-10-04 DIAGNOSIS — Z5111 Encounter for antineoplastic chemotherapy: Secondary | ICD-10-CM

## 2013-10-04 DIAGNOSIS — Z170 Estrogen receptor positive status [ER+]: Secondary | ICD-10-CM

## 2013-10-04 LAB — COMPREHENSIVE METABOLIC PANEL (CC13)
ALBUMIN: 3.6 g/dL (ref 3.5–5.0)
ALK PHOS: 72 U/L (ref 40–150)
ALT: 18 U/L (ref 0–55)
AST: 15 U/L (ref 5–34)
Anion Gap: 8 mEq/L (ref 3–11)
BUN: 12.7 mg/dL (ref 7.0–26.0)
CO2: 23 meq/L (ref 22–29)
Calcium: 9 mg/dL (ref 8.4–10.4)
Chloride: 109 mEq/L (ref 98–109)
Creatinine: 0.8 mg/dL (ref 0.6–1.1)
GLUCOSE: 144 mg/dL — AB (ref 70–140)
POTASSIUM: 3.8 meq/L (ref 3.5–5.1)
SODIUM: 140 meq/L (ref 136–145)
TOTAL PROTEIN: 7.5 g/dL (ref 6.4–8.3)
Total Bilirubin: <0.2 mg/dL (ref 0.20–1.20)

## 2013-10-04 LAB — CBC WITH DIFFERENTIAL/PLATELET
BASO%: 0.2 % (ref 0.0–2.0)
Basophils Absolute: 0 10*3/uL (ref 0.0–0.1)
EOS ABS: 0.1 10*3/uL (ref 0.0–0.5)
EOS%: 1 % (ref 0.0–7.0)
HCT: 35.6 % (ref 34.8–46.6)
HGB: 11.5 g/dL — ABNORMAL LOW (ref 11.6–15.9)
LYMPH%: 41.2 % (ref 14.0–49.7)
MCH: 26.8 pg (ref 25.1–34.0)
MCHC: 32.3 g/dL (ref 31.5–36.0)
MCV: 83 fL (ref 79.5–101.0)
MONO#: 0.3 10*3/uL (ref 0.1–0.9)
MONO%: 6.8 % (ref 0.0–14.0)
NEUT#: 2.6 10*3/uL (ref 1.5–6.5)
NEUT%: 50.8 % (ref 38.4–76.8)
Platelets: 302 10*3/uL (ref 145–400)
RBC: 4.29 10*6/uL (ref 3.70–5.45)
RDW: 14.4 % (ref 11.2–14.5)
WBC: 5 10*3/uL (ref 3.9–10.3)
lymph#: 2.1 10*3/uL (ref 0.9–3.3)

## 2013-10-04 MED ORDER — DEXAMETHASONE SODIUM PHOSPHATE 20 MG/5ML IJ SOLN
20.0000 mg | Freq: Once | INTRAMUSCULAR | Status: AC
  Administered 2013-10-04: 20 mg via INTRAVENOUS

## 2013-10-04 MED ORDER — FAMOTIDINE IN NACL 20-0.9 MG/50ML-% IV SOLN
INTRAVENOUS | Status: AC
  Filled 2013-10-04: qty 50

## 2013-10-04 MED ORDER — PACLITAXEL CHEMO INJECTION 300 MG/50ML
80.0000 mg/m2 | Freq: Once | INTRAVENOUS | Status: AC
  Administered 2013-10-04: 162 mg via INTRAVENOUS
  Filled 2013-10-04: qty 27

## 2013-10-04 MED ORDER — SODIUM CHLORIDE 0.9 % IV SOLN
Freq: Once | INTRAVENOUS | Status: AC
  Administered 2013-10-04: 11:00:00 via INTRAVENOUS

## 2013-10-04 MED ORDER — LORAZEPAM 0.5 MG PO TABS: 0.5000 mg | ORAL_TABLET | Freq: Four times a day (QID) | ORAL | Status: DC | PRN

## 2013-10-04 MED ORDER — HEPARIN SOD (PORK) LOCK FLUSH 100 UNIT/ML IV SOLN
500.0000 [IU] | Freq: Once | INTRAVENOUS | Status: AC | PRN
  Administered 2013-10-04: 500 [IU]
  Filled 2013-10-04: qty 5

## 2013-10-04 MED ORDER — ONDANSETRON HCL 8 MG PO TABS: 8.0000 mg | ORAL_TABLET | Freq: Two times a day (BID) | ORAL | Status: DC

## 2013-10-04 MED ORDER — ACETAMINOPHEN 325 MG PO TABS
ORAL_TABLET | ORAL | Status: AC
Start: 2013-10-04 — End: 2013-10-04
  Filled 2013-10-04: qty 1

## 2013-10-04 MED ORDER — DIPHENHYDRAMINE HCL 50 MG/ML IJ SOLN
50.0000 mg | Freq: Once | INTRAMUSCULAR | Status: AC
  Administered 2013-10-04: 50 mg via INTRAVENOUS

## 2013-10-04 MED ORDER — DEXAMETHASONE 4 MG PO TABS: 4.0000 mg | ORAL_TABLET | Freq: Two times a day (BID) | ORAL | Status: DC

## 2013-10-04 MED ORDER — DIPHENHYDRAMINE HCL 50 MG/ML IJ SOLN
INTRAMUSCULAR | Status: AC
  Filled 2013-10-04: qty 1

## 2013-10-04 MED ORDER — SODIUM CHLORIDE 0.9 % IJ SOLN
10.0000 mL | INTRAMUSCULAR | Status: DC | PRN
  Administered 2013-10-04: 10 mL
  Filled 2013-10-04: qty 10

## 2013-10-04 MED ORDER — FAMOTIDINE IN NACL 20-0.9 MG/50ML-% IV SOLN
20.0000 mg | Freq: Once | INTRAVENOUS | Status: AC
  Administered 2013-10-04: 20 mg via INTRAVENOUS

## 2013-10-04 MED ORDER — SODIUM CHLORIDE 0.9 % IV SOLN
4.0000 mg/kg | Freq: Once | INTRAVENOUS | Status: AC
  Administered 2013-10-04: 357 mg via INTRAVENOUS
  Filled 2013-10-04: qty 17

## 2013-10-04 MED ORDER — ONDANSETRON 8 MG/50ML IVPB (CHCC)
8.0000 mg | Freq: Once | INTRAVENOUS | Status: AC
  Administered 2013-10-04: 8 mg via INTRAVENOUS

## 2013-10-04 MED ORDER — ONDANSETRON 8 MG/NS 50 ML IVPB
INTRAVENOUS | Status: AC
  Filled 2013-10-04: qty 8

## 2013-10-04 MED ORDER — PROCHLORPERAZINE MALEATE 10 MG PO TABS: 10.0000 mg | ORAL_TABLET | Freq: Four times a day (QID) | ORAL | Status: DC | PRN

## 2013-10-04 MED ORDER — ACETAMINOPHEN 325 MG PO TABS
650.0000 mg | ORAL_TABLET | Freq: Once | ORAL | Status: AC
  Administered 2013-10-04: 650 mg via ORAL

## 2013-10-04 MED ORDER — DEXAMETHASONE SODIUM PHOSPHATE 20 MG/5ML IJ SOLN
INTRAMUSCULAR | Status: AC
  Filled 2013-10-04: qty 5

## 2013-10-04 NOTE — Telephone Encounter (Signed)
per pof to sch pt appt-will add MD appt to other appts-gave pt sch through 9/15-

## 2013-10-04 NOTE — Patient Instructions (Addendum)
Nausea medication  Decadron/Dexamethasone: take two tablets twice a day starting the day after chemotherapy.  Ondansetron/Zofran: take twice a day for three days starting the day after chemotherapy.    Prochlorperazine/Compazine: Take every 6 hours as needed for nausea  Lorazepam/Ativan: Take nightly for nausea if needed.    Apply tea tree to your nails daily.    Trastuzumab injection for infusion What is this medicine? TRASTUZUMAB (tras TOO zoo mab) is a monoclonal antibody. It targets a protein called HER2. This protein is found in some stomach and breast cancers. This medicine can stop cancer cell growth. This medicine may be used with other cancer treatments. This medicine may be used for other purposes; ask your health care provider or pharmacist if you have questions. COMMON BRAND NAME(S): Herceptin What should I tell my health care provider before I take this medicine? They need to know if you have any of these conditions: -heart disease -heart failure -infection (especially a virus infection such as chickenpox, cold sores, or herpes) -lung or breathing disease, like asthma -recent or ongoing radiation therapy -an unusual or allergic reaction to trastuzumab, benzyl alcohol, or other medications, foods, dyes, or preservatives -pregnant or trying to get pregnant -breast-feeding How should I use this medicine? This drug is given as an infusion into a vein. It is administered in a hospital or clinic by a specially trained health care professional. Talk to your pediatrician regarding the use of this medicine in children. This medicine is not approved for use in children. Overdosage: If you think you have taken too much of this medicine contact a poison control center or emergency room at once. NOTE: This medicine is only for you. Do not share this medicine with others. What if I miss a dose? It is important not to miss a dose. Call your doctor or health care professional if you are  unable to keep an appointment. What may interact with this medicine? -cyclophosphamide -doxorubicin -warfarin This list may not describe all possible interactions. Give your health care provider a list of all the medicines, herbs, non-prescription drugs, or dietary supplements you use. Also tell them if you smoke, drink alcohol, or use illegal drugs. Some items may interact with your medicine. What should I watch for while using this medicine? Visit your doctor for checks on your progress. Report any side effects. Continue your course of treatment even though you feel ill unless your doctor tells you to stop. Call your doctor or health care professional for advice if you get a fever, chills or sore throat, or other symptoms of a cold or flu. Do not treat yourself. Try to avoid being around people who are sick. You may experience fever, chills and shaking during your first infusion. These effects are usually mild and can be treated with other medicines. Report any side effects during the infusion to your health care professional. Fever and chills usually do not happen with later infusions. What side effects may I notice from receiving this medicine? Side effects that you should report to your doctor or other health care professional as soon as possible: -breathing difficulties -chest pain or palpitations -cough -dizziness or fainting -fever or chills, sore throat -skin rash, itching or hives -swelling of the legs or ankles -unusually weak or tired Side effects that usually do not require medical attention (report to your doctor or other health care professional if they continue or are bothersome): -loss of appetite -headache -muscle aches -nausea This list may not describe all possible side  effects. Call your doctor for medical advice about side effects. You may report side effects to FDA at 1-800-FDA-1088. Where should I keep my medicine? This drug is given in a hospital or clinic and will  not be stored at home. NOTE: This sheet is a summary. It may not cover all possible information. If you have questions about this medicine, talk to your doctor, pharmacist, or health care provider.  2015, [employer] Standard. (2008-11-24 13:43:15) Paclitaxel injection What is this medicine? PACLITAXEL (PAK li TAX el) is a chemotherapy drug. It targets fast dividing cells, like cancer cells, and causes these cells to die. This medicine is used to treat ovarian cancer, breast cancer, and other cancers. This medicine may be used for other purposes; ask your health care provider or pharmacist if you have questions. COMMON BRAND NAME(S): Onxol, Taxol What should I tell my health care provider before I take this medicine? They need to know if you have any of these conditions: -blood disorders -irregular heartbeat -infection (especially a virus infection such as chickenpox, cold sores, or herpes) -liver disease -previous or ongoing radiation therapy -an unusual or allergic reaction to paclitaxel, alcohol, polyoxyethylated castor oil, other chemotherapy agents, other medicines, foods, dyes, or preservatives -pregnant or trying to get pregnant -breast-feeding How should I use this medicine? This drug is given as an infusion into a vein. It is administered in a hospital or clinic by a specially trained health care professional. Talk to your pediatrician regarding the use of this medicine in children. Special care may be needed. Overdosage: If you think you have taken too much of this medicine contact a poison control center or emergency room at once. NOTE: This medicine is only for you. Do not share this medicine with others. What if I miss a dose? It is important not to miss your dose. Call your doctor or health care professional if you are unable to keep an appointment. What may interact with this medicine? Do not take this medicine with any of the following  medications: -disulfiram -metronidazole This medicine may also interact with the following medications: -cyclosporine -diazepam -ketoconazole -medicines to increase blood counts like filgrastim, pegfilgrastim, sargramostim -other chemotherapy drugs like cisplatin, doxorubicin, epirubicin, etoposide, teniposide, vincristine -quinidine -testosterone -vaccines -verapamil Talk to your doctor or health care professional before taking any of these medicines: -acetaminophen -aspirin -ibuprofen -ketoprofen -naproxen This list may not describe all possible interactions. Give your health care provider a list of all the medicines, herbs, non-prescription drugs, or dietary supplements you use. Also tell them if you smoke, drink alcohol, or use illegal drugs. Some items may interact with your medicine. What should I watch for while using this medicine? Your condition will be monitored carefully while you are receiving this medicine. You will need important blood work done while you are taking this medicine. This drug may make you feel generally unwell. This is not uncommon, as chemotherapy can affect healthy cells as well as cancer cells. Report any side effects. Continue your course of treatment even though you feel ill unless your doctor tells you to stop. In some cases, you may be given additional medicines to help with side effects. Follow all directions for their use. Call your doctor or health care professional for advice if you get a fever, chills or sore throat, or other symptoms of a cold or flu. Do not treat yourself. This drug decreases your body's ability to fight infections. Try to avoid being around people who are sick. This medicine may increase  your risk to bruise or bleed. Call your doctor or health care professional if you notice any unusual bleeding. Be careful brushing and flossing your teeth or using a toothpick because you may get an infection or bleed more easily. If you have any  dental work done, tell your dentist you are receiving this medicine. Avoid taking products that contain aspirin, acetaminophen, ibuprofen, naproxen, or ketoprofen unless instructed by your doctor. These medicines may hide a fever. Do not become pregnant while taking this medicine. Women should inform their doctor if they wish to become pregnant or think they might be pregnant. There is a potential for serious side effects to an unborn child. Talk to your health care professional or pharmacist for more information. Do not breast-feed an infant while taking this medicine. Men are advised not to father a child while receiving this medicine. What side effects may I notice from receiving this medicine? Side effects that you should report to your doctor or health care professional as soon as possible: -allergic reactions like skin rash, itching or hives, swelling of the face, lips, or tongue -low blood counts - This drug may decrease the number of white blood cells, red blood cells and platelets. You may be at increased risk for infections and bleeding. -signs of infection - fever or chills, cough, sore throat, pain or difficulty passing urine -signs of decreased platelets or bleeding - bruising, pinpoint red spots on the skin, black, tarry stools, nosebleeds -signs of decreased red blood cells - unusually weak or tired, fainting spells, lightheadedness -breathing problems -chest pain -high or low blood pressure -mouth sores -nausea and vomiting -pain, swelling, redness or irritation at the injection site -pain, tingling, numbness in the hands or feet -slow or irregular heartbeat -swelling of the ankle, feet, hands Side effects that usually do not require medical attention (report to your doctor or health care professional if they continue or are bothersome): -bone pain -complete hair loss including hair on your head, underarms, pubic hair, eyebrows, and eyelashes -changes in the color of  fingernails -diarrhea -loosening of the fingernails -loss of appetite -muscle or joint pain -red flush to skin -sweating This list may not describe all possible side effects. Call your doctor for medical advice about side effects. You may report side effects to FDA at 1-800-FDA-1088. Where should I keep my medicine? This drug is given in a hospital or clinic and will not be stored at home. NOTE: This sheet is a summary. It may not cover all possible information. If you have questions about this medicine, talk to your doctor, pharmacist, or health care provider.  2015, [employer] Standard. (2012-03-15 16:41:21)

## 2013-10-04 NOTE — Telephone Encounter (Signed)
per pof to sch pt appt-cld pt with trmt times & dr Annabell Sabal appt time & date-pt understood

## 2013-10-04 NOTE — Progress Notes (Signed)
[facility] CONSULT NOTE  Patient Care Team: Gavin Pound, MD as PCP - General (Family Medicine) Shann Medal, MD as Consulting Physician (General Surgery) Thea Silversmith, MD as Consulting Physician (Radiation Oncology) Gavin Pound, MD as Consulting Physician (Family Medicine) Rulon Eisenmenger, MD as Consulting Physician (Hematology and Oncology)  DIAGNOSIS Newly diagnosed breast cancer  HISTORY OF PRESENTING ILLNESS:  Jean [name] 53 y.o. female is here because of recent diagnosis of left breast cancer. 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 Patient was evaluated with an MRI that revealed a 2.1 cm abnormality in the left breast. She was then taken to surgery and she underwent left lumpectomy on 08/30/2013 and sentinel lymph node study. 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 Surprisingly HER-2 was amplified heterogeneously throughout lumpectomy specimen with a HER-2/CEP 17 ratio of 2.3. The Oncotype DX performed on the original biopsy came back as low risk. The score was 17. However this is probably unreliable given the fact that her tumor was HER-2 amplified.   INTERVAL HISTORY:  Jean [name] is doing well today.  She is here with her boyfriend Jean [name] today prior to receiving her first treatment of Taxol and Herceptin.  She has not received prescriptions as of yet and wants to know how to take her anti-emetics.  She has received her EMLA cream.  She denies fevers, chills, nausea, vomiting, constipation, diarrhea, numbness/tingling, or any further concerns.    SUMMARY OF ONCOLOGIC HISTORY:   Breast cancer of upper-outer quadrant of left female breast   07/22/2013 Mammogram 1.8 cm  mass with mammographic and ultrasound features suspicious for malignancy in the 9 o'clock position of the left breast   08/12/2013 Initial Diagnosis Breast cancer of upper-outer quadrant of left female breast.  invasive ductal carcinoma plus DCIS.  The cancer was grade 2 and ER positive at 100%, PR+ at 91% and HER2- with a Ki67 of 47%   08/12/2013 Breast MRI 2.1 x 1.2 x 1.2 cm biopsy-proven invasive ductal carcinoma and ductal carcinoma in situ in the 9 o'clock position of the left breast   08/30/2013 Surgery Left Lumpectomy IDC with infiltrating lobular features. Pos for LVSI; 1 mm from nearest margin, DCIS 1 SLN Neg; ONCOTYPE Dx Rec score 17, 10 yr risk of recurrance is 11% with Tamoxifen alone; HER2 Amplified Ratio 2.3    In terms of breast cancer risk profile:  She menarched at early age of 76 and went to menopause at age ? (hysterectomy at age on 37  She had 2 pregnancy, her first child was born at age 72 She has family history of Breast/ and GI cancer  MEDICAL HISTORY:  Past Medical History  Diagnosis Date  . GERD (gastroesophageal reflux disease)     no current med.  . Dental crown present   . Arthritis     back  . Breast cancer 08/2013    left    SURGICAL HISTORY: Past Surgical History  Procedure Laterality Date  . Foot surgery Right   . Abdominal hysterectomy  12/16/2007  . Lumpectory left  August 30, 2013  . Portacath placement Right 09/26/2013    Procedure: INSERTION PORT-A-CATH;  Surgeon: Shann Medal, MD;  Location: [facility];  Service: General;  Laterality: Right;  SOCIAL HISTORY: History   Social History  . Marital Status: Divorced    Spouse Name: N/A    Number of Children: N/A  . Years of Education: N/A   Occupational History  . Not on file.   Social History Main Topics  . Smoking status: Former Smoker -- 0.25 packs/day for 2.5 years    Types: Cigarettes    Quit date: 02/03/2011  . Smokeless tobacco: Never Used  . Alcohol Use: No     Comment: occasionally   . Drug Use: No  . Sexual Activity: Yes   Other Topics Concern  . Not on file   Social History Narrative  . No narrative on file    FAMILY HISTORY: Family History  Problem Relation Age of Onset  . Breast cancer Paternal Aunt   . Cervical cancer Paternal Aunt   . Colon cancer Paternal Uncle   . Prostate cancer Paternal Uncle     ALLERGIES:  is allergic to hydrocodone.  MEDICATIONS:  Current Outpatient Prescriptions  Medication Sig Dispense Refill  . cholecalciferol (VITAMIN D) 1000 UNITS tablet Take 1,000 Units by mouth daily.      . Cyanocobalamin (VITAMIN B 12 PO) Take 1 tablet by mouth daily.      Marland Kitchen HYDROcodone-acetaminophen (NORCO/VICODIN) 5-325 MG per tablet       . lidocaine-prilocaine (EMLA) cream Apply 1 application topically as needed.  30 g  3  . Multiple Vitamin (MULTIVITAMIN) tablet Take 1 tablet by mouth daily.      Marland Kitchen oxyCODONE-acetaminophen (PERCOCET) 10-325 MG per tablet Take 1 tablet by mouth every 4 (four) hours as needed for pain.  30 tablet  0  . zolpidem (AMBIEN) 5 MG tablet Take 5 mg by mouth at bedtime as needed for sleep.       No current facility-administered medications for this visit.    REVIEW OF SYSTEMS:   A 10 point review of systems was conducted and is otherwise negative except for what is noted above.     PHYSICAL EXAMINATION: ECOG PERFORMANCE STATUS: 1 - Symptomatic but completely ambulatory  Filed Vitals:   10/04/13 0901  BP: 152/88  Pulse: 80  Temp: 98.4 F (36.9 C)  Resp: 18   Filed Weights   10/04/13 0901  Weight: 198 lb (89.812 kg)   GENERAL: Patient is a well appearing female in no acute distress HEENT:  Sclerae anicteric.  Oropharynx clear and moist. No ulcerations or evidence of oropharyngeal candidiasis. Neck is supple.  NODES:  No cervical, supraclavicular, or axillary lymphadenopathy palpated.  BREAST EXAM:  Deferred. LUNGS:  Clear to auscultation bilaterally.  No wheezes or rhonchi. HEART:  Regular rate and  rhythm. No murmur appreciated. ABDOMEN:  Soft, nontender.  Positive, normoactive bowel sounds. No organomegaly palpated. MSK:  No focal spinal tenderness to palpation. Full range of motion bilaterally in the upper extremities. EXTREMITIES:  No peripheral edema.   SKIN:  Clear with no obvious rashes or skin changes. No nail dyscrasia. NEURO:  Nonfocal. Well oriented.  Appropriate affect.    LABORATORY DATA:  I have reviewed the data as listed Lab Results  Component Value Date   WBC 5.0 10/04/2013   HGB 11.5* 10/04/2013   HCT 35.6 10/04/2013   MCV 83.0 10/04/2013   PLT 302 10/04/2013   Lab Results  Component Value Date   NA 142 09/20/2013   K 3.8 09/20/2013   CL 105 12/17/2007   CO2 27 09/20/2013     ASSESSMENT:  53 year old [place],  [place] woman with   1. 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  Pathology demonstrated heterogeneity in regards to HER-2 expression.  Lumpectomy date was on 08/30/13 by Dr. Lucia Gaskins.    2. Patient began weekly Paclitaxel and Trastuzumab therapy on 10/04/13.  A total of 12 weeks are planned.  3. Radiation therapy to follow chemotherapy.  4. Anti-estrogen therapy to follow radiation therapy.    PLAN:    Jean [name] is doing well today.  I reviewed her lab work with her in extensive detail and also her anti-emetic regimen.  Her labs are normal and seh will proceed with chemotherapy today.  We reviewed her chemotherapy regimen and I verified the regimen with Dr. Lindi Adie with Jean [name]'s pathology report.  Jean [name] and I discussed the risk of cardiotoxicity.  I referred her to Dr. Haroldine Laws to have every 3 month evaluations of her heart function in order to help detect and prevent cardiotoxicity as quickly as possible.  We discussed Neulasta and I also ensured the rest of her appointments were set up for the next 12 weeks.    Jean [name] will return in 1 week for labs, evaluation, and week 2 of Paclitaxel and Trastuzumab.   She is in agreement with the above plan.  She knows that her goal for treatment is cure.   She knows to call us in the interim for any questions or concerns.  We can certainly see her sooner if needed.  I spent 40 minutes counseling the patient face to face.  The total time spent in the appointment was 50 minutes.    Minette Headland, [place] [phone] 10/04/2013 9:23 AM

## 2013-10-04 NOTE — Patient Instructions (Signed)
[facility] Discharge Instructions for Patients Receiving Chemotherapy  Today you received the following chemotherapy agents Paclitaxel/Herceptin.   To help prevent nausea and vomiting after your treatment, we encourage you to take your nausea medication as directed.    If you develop nausea and vomiting that is not controlled by your nausea medication, call the clinic.   BELOW ARE SYMPTOMS THAT SHOULD BE REPORTED IMMEDIATELY:  *FEVER GREATER THAN 100.5 F  *CHILLS WITH OR WITHOUT FEVER  NAUSEA AND VOMITING THAT IS NOT CONTROLLED WITH YOUR NAUSEA MEDICATION  *UNUSUAL SHORTNESS OF BREATH  *UNUSUAL BRUISING OR BLEEDING  TENDERNESS IN MOUTH AND THROAT WITH OR WITHOUT PRESENCE OF ULCERS  *URINARY PROBLEMS  *BOWEL PROBLEMS  UNUSUAL RASH Items with * indicate a potential emergency and should be followed up as soon as possible.  Feel free to call the clinic you have any questions or concerns. The clinic phone number is [phone].  Paclitaxel injection What is this medicine? PACLITAXEL (PAK li TAX el) is a chemotherapy drug. It targets fast dividing cells, like cancer cells, and causes these cells to die. This medicine is used to treat ovarian cancer, breast cancer, and other cancers. This medicine may be used for other purposes; ask your health care provider or pharmacist if you have questions. COMMON BRAND NAME(S): Onxol, Taxol What should I tell my health care provider before I take this medicine? They need to know if you have any of these conditions: -blood disorders -irregular heartbeat -infection (especially a virus infection such as chickenpox, cold sores, or herpes) -liver disease -previous or ongoing radiation therapy -an unusual or allergic reaction to paclitaxel, alcohol, polyoxyethylated castor oil, other chemotherapy agents, other medicines, foods, dyes, or preservatives -pregnant or trying to get pregnant -breast-feeding How should I use this  medicine? This drug is given as an infusion into a vein. It is administered in a hospital or clinic by a specially trained health care professional. Talk to your pediatrician regarding the use of this medicine in children. Special care may be needed. Overdosage: If you think you have taken too much of this medicine contact a poison control center or emergency room at once. NOTE: This medicine is only for you. Do not share this medicine with others. What if I miss a dose? It is important not to miss your dose. Call your doctor or health care professional if you are unable to keep an appointment. What may interact with this medicine? Do not take this medicine with any of the following medications: -disulfiram -metronidazole This medicine may also interact with the following medications: -cyclosporine -diazepam -ketoconazole -medicines to increase blood counts like filgrastim, pegfilgrastim, sargramostim -other chemotherapy drugs like cisplatin, doxorubicin, epirubicin, etoposide, teniposide, vincristine -quinidine -testosterone -vaccines -verapamil Talk to your doctor or health care professional before taking any of these medicines: -acetaminophen -aspirin -ibuprofen -ketoprofen -naproxen This list may not describe all possible interactions. Give your health care provider a list of all the medicines, herbs, non-prescription drugs, or dietary supplements you use. Also tell them if you smoke, drink alcohol, or use illegal drugs. Some items may interact with your medicine. What should I watch for while using this medicine? Your condition will be monitored carefully while you are receiving this medicine. You will need important blood work done while you are taking this medicine. This drug may make you feel generally unwell. This is not uncommon, as chemotherapy can affect healthy cells as well as cancer cells. Report any side effects. Continue your course of  treatment even though you feel ill  unless your doctor tells you to stop. In some cases, you may be given additional medicines to help with side effects. Follow all directions for their use. Call your doctor or health care professional for advice if you get a fever, chills or sore throat, or other symptoms of a cold or flu. Do not treat yourself. This drug decreases your body's ability to fight infections. Try to avoid being around people who are sick. This medicine may increase your risk to bruise or bleed. Call your doctor or health care professional if you notice any unusual bleeding. Be careful brushing and flossing your teeth or using a toothpick because you may get an infection or bleed more easily. If you have any dental work done, tell your dentist you are receiving this medicine. Avoid taking products that contain aspirin, acetaminophen, ibuprofen, naproxen, or ketoprofen unless instructed by your doctor. These medicines may hide a fever. Do not become pregnant while taking this medicine. Women should inform their doctor if they wish to become pregnant or think they might be pregnant. There is a potential for serious side effects to an unborn child. Talk to your health care professional or pharmacist for more information. Do not breast-feed an infant while taking this medicine. Men are advised not to father a child while receiving this medicine. What side effects may I notice from receiving this medicine? Side effects that you should report to your doctor or health care professional as soon as possible: -allergic reactions like skin rash, itching or hives, swelling of the face, lips, or tongue -low blood counts - This drug may decrease the number of white blood cells, red blood cells and platelets. You may be at increased risk for infections and bleeding. -signs of infection - fever or chills, cough, sore throat, pain or difficulty passing urine -signs of decreased platelets or bleeding - bruising, pinpoint red spots on the skin,  black, tarry stools, nosebleeds -signs of decreased red blood cells - unusually weak or tired, fainting spells, lightheadedness -breathing problems -chest pain -high or low blood pressure -mouth sores -nausea and vomiting -pain, swelling, redness or irritation at the injection site -pain, tingling, numbness in the hands or feet -slow or irregular heartbeat -swelling of the ankle, feet, hands Side effects that usually do not require medical attention (report to your doctor or health care professional if they continue or are bothersome): -bone pain -complete hair loss including hair on your head, underarms, pubic hair, eyebrows, and eyelashes -changes in the color of fingernails -diarrhea -loosening of the fingernails -loss of appetite -muscle or joint pain -red flush to skin -sweating This list may not describe all possible side effects. Call your doctor for medical advice about side effects. You may report side effects to FDA at 1-800-FDA-1088. Where should I keep my medicine? This drug is given in a hospital or clinic and will not be stored at home. NOTE: This sheet is a summary. It may not cover all possible information. If you have questions about this medicine, talk to your doctor, pharmacist, or health care provider.  2015, Elsevier/Gold Standard. (2012-03-15 16:41:21)  Trastuzumab injection for infusion What is this medicine? TRASTUZUMAB (tras TOO zoo mab) is a monoclonal antibody. It targets a protein called HER2. This protein is found in some stomach and breast cancers. This medicine can stop cancer cell growth. This medicine may be used with other cancer treatments. This medicine may be used for other purposes; ask your health care  provider or pharmacist if you have questions. COMMON BRAND NAME(S): Herceptin What should I tell my health care provider before I take this medicine? They need to know if you have any of these conditions: -heart disease -heart  failure -infection (especially a virus infection such as chickenpox, cold sores, or herpes) -lung or breathing disease, like asthma -recent or ongoing radiation therapy -an unusual or allergic reaction to trastuzumab, benzyl alcohol, or other medications, foods, dyes, or preservatives -pregnant or trying to get pregnant -breast-feeding How should I use this medicine? This drug is given as an infusion into a vein. It is administered in a hospital or clinic by a specially trained health care professional. Talk to your pediatrician regarding the use of this medicine in children. This medicine is not approved for use in children. Overdosage: If you think you have taken too much of this medicine contact a poison control center or emergency room at once. NOTE: This medicine is only for you. Do not share this medicine with others. What if I miss a dose? It is important not to miss a dose. Call your doctor or health care professional if you are unable to keep an appointment. What may interact with this medicine? -cyclophosphamide -doxorubicin -warfarin This list may not describe all possible interactions. Give your health care provider a list of all the medicines, herbs, non-prescription drugs, or dietary supplements you use. Also tell them if you smoke, drink alcohol, or use illegal drugs. Some items may interact with your medicine. What should I watch for while using this medicine? Visit your doctor for checks on your progress. Report any side effects. Continue your course of treatment even though you feel ill unless your doctor tells you to stop. Call your doctor or health care professional for advice if you get a fever, chills or sore throat, or other symptoms of a cold or flu. Do not treat yourself. Try to avoid being around people who are sick. You may experience fever, chills and shaking during your first infusion. These effects are usually mild and can be treated with other medicines. Report  any side effects during the infusion to your health care professional. Fever and chills usually do not happen with later infusions. What side effects may I notice from receiving this medicine? Side effects that you should report to your doctor or other health care professional as soon as possible: -breathing difficulties -chest pain or palpitations -cough -dizziness or fainting -fever or chills, sore throat -skin rash, itching or hives -swelling of the legs or ankles -unusually weak or tired Side effects that usually do not require medical attention (report to your doctor or other health care professional if they continue or are bothersome): -loss of appetite -headache -muscle aches -nausea This list may not describe all possible side effects. Call your doctor for medical advice about side effects. You may report side effects to FDA at 1-800-FDA-1088. Where should I keep my medicine? This drug is given in a hospital or clinic and will not be stored at home. NOTE: This sheet is a summary. It may not cover all possible information. If you have questions about this medicine, talk to your doctor, pharmacist, or health care provider.  2015, Elsevier/Gold Standard. (2008-11-24 13:43:15)

## 2013-10-05 ENCOUNTER — Telehealth: Payer: Self-pay | Admitting: *Deleted

## 2013-10-05 NOTE — Telephone Encounter (Signed)
Called to confirm that pt has received her anti nausea meds. Pt said she picked all of them up from pharmacy yesterday. Message to forwarded to Charlestine Massed, NP.

## 2013-10-05 NOTE — Telephone Encounter (Signed)
Spoke with pt today for post chemo follow up.  Stated she slept well last night.  Had mild headache this am but was told by Dr. Lindi Adie that it was side effect of Zofran.  Informed pt that she could take Tylenol to help with headache.  Pt denied blurred vision.  Stated good appetite, and drinking lots of fluids as tolerated.  Denied nausea/vomiting.  Stated bowel and bladder function fine.   Denied pain.  Pt aware of next returned appts.

## 2013-10-06 ENCOUNTER — Encounter: Payer: Self-pay | Admitting: Hematology and Oncology

## 2013-10-06 NOTE — Progress Notes (Signed)
Put [employer] disability paper on nurse's desk.

## 2013-10-07 ENCOUNTER — Telehealth: Payer: Self-pay

## 2013-10-07 NOTE — Telephone Encounter (Signed)
Returned call to Jenny Reichmann at [employer] [phone].  Provided 1st date of treatment as 9/1, next treatment date 9/8, treatment intervals, next ofc visit 9/8.  Unable to answer his query as to whether or not she would be able to work d/t unknown response to chemo at this time.  He stated that he understood and I provided him with fax number at this desk [phone].

## 2013-10-11 ENCOUNTER — Ambulatory Visit (HOSPITAL_BASED_OUTPATIENT_CLINIC_OR_DEPARTMENT_OTHER): Payer: BC Managed Care – PPO

## 2013-10-11 ENCOUNTER — Telehealth: Payer: Self-pay | Admitting: Hematology and Oncology

## 2013-10-11 ENCOUNTER — Ambulatory Visit (HOSPITAL_BASED_OUTPATIENT_CLINIC_OR_DEPARTMENT_OTHER): Payer: BC Managed Care – PPO | Admitting: Hematology and Oncology

## 2013-10-11 ENCOUNTER — Other Ambulatory Visit (HOSPITAL_BASED_OUTPATIENT_CLINIC_OR_DEPARTMENT_OTHER): Payer: BC Managed Care – PPO

## 2013-10-11 VITALS — BP 146/93 | HR 83 | Temp 98.2°F | Resp 18 | Ht 66.0 in | Wt 197.0 lb

## 2013-10-11 DIAGNOSIS — C50412 Malignant neoplasm of upper-outer quadrant of left female breast: Secondary | ICD-10-CM

## 2013-10-11 DIAGNOSIS — C50919 Malignant neoplasm of unspecified site of unspecified female breast: Secondary | ICD-10-CM

## 2013-10-11 DIAGNOSIS — Z5112 Encounter for antineoplastic immunotherapy: Secondary | ICD-10-CM

## 2013-10-11 DIAGNOSIS — Z5111 Encounter for antineoplastic chemotherapy: Secondary | ICD-10-CM

## 2013-10-11 LAB — COMPREHENSIVE METABOLIC PANEL (CC13)
ALK PHOS: 66 U/L (ref 40–150)
ALT: 21 U/L (ref 0–55)
AST: 16 U/L (ref 5–34)
Albumin: 3.7 g/dL (ref 3.5–5.0)
Anion Gap: 7 mEq/L (ref 3–11)
BUN: 15.9 mg/dL (ref 7.0–26.0)
CO2: 26 mEq/L (ref 22–29)
CREATININE: 0.9 mg/dL (ref 0.6–1.1)
Calcium: 8.9 mg/dL (ref 8.4–10.4)
Chloride: 106 mEq/L (ref 98–109)
Glucose: 101 mg/dl (ref 70–140)
Potassium: 4.3 mEq/L (ref 3.5–5.1)
SODIUM: 140 meq/L (ref 136–145)
TOTAL PROTEIN: 7.4 g/dL (ref 6.4–8.3)

## 2013-10-11 LAB — CBC WITH DIFFERENTIAL/PLATELET
BASO%: 0.7 % (ref 0.0–2.0)
Basophils Absolute: 0 10*3/uL (ref 0.0–0.1)
EOS%: 0.9 % (ref 0.0–7.0)
Eosinophils Absolute: 0 10*3/uL (ref 0.0–0.5)
HCT: 35.7 % (ref 34.8–46.6)
HGB: 11.5 g/dL — ABNORMAL LOW (ref 11.6–15.9)
LYMPH%: 29.4 % (ref 14.0–49.7)
MCH: 26.6 pg (ref 25.1–34.0)
MCHC: 32.2 g/dL (ref 31.5–36.0)
MCV: 82.9 fL (ref 79.5–101.0)
MONO#: 0.2 10*3/uL (ref 0.1–0.9)
MONO%: 4.1 % (ref 0.0–14.0)
NEUT#: 3.4 10*3/uL (ref 1.5–6.5)
NEUT%: 64.9 % (ref 38.4–76.8)
Platelets: 396 10*3/uL (ref 145–400)
RBC: 4.31 10*6/uL (ref 3.70–5.45)
RDW: 14.3 % (ref 11.2–14.5)
WBC: 5.2 10*3/uL (ref 3.9–10.3)
lymph#: 1.5 10*3/uL (ref 0.9–3.3)

## 2013-10-11 MED ORDER — DIPHENHYDRAMINE HCL 50 MG/ML IJ SOLN
50.0000 mg | Freq: Once | INTRAMUSCULAR | Status: AC
  Administered 2013-10-11: 50 mg via INTRAVENOUS

## 2013-10-11 MED ORDER — SODIUM CHLORIDE 0.9 % IJ SOLN
10.0000 mL | INTRAMUSCULAR | Status: DC | PRN
  Administered 2013-10-11: 10 mL
  Filled 2013-10-11: qty 10

## 2013-10-11 MED ORDER — ACETAMINOPHEN 325 MG PO TABS
ORAL_TABLET | ORAL | Status: AC
  Filled 2013-10-11: qty 2

## 2013-10-11 MED ORDER — SODIUM CHLORIDE 0.9 % IV SOLN
2.0000 mg/kg | Freq: Once | INTRAVENOUS | Status: AC
  Administered 2013-10-11: 189 mg via INTRAVENOUS
  Filled 2013-10-11: qty 9

## 2013-10-11 MED ORDER — DIPHENHYDRAMINE HCL 50 MG/ML IJ SOLN
INTRAMUSCULAR | Status: AC
  Filled 2013-10-11: qty 1

## 2013-10-11 MED ORDER — HEPARIN SOD (PORK) LOCK FLUSH 100 UNIT/ML IV SOLN
500.0000 [IU] | Freq: Once | INTRAVENOUS | Status: AC | PRN
  Administered 2013-10-11: 500 [IU]
  Filled 2013-10-11: qty 5

## 2013-10-11 MED ORDER — FAMOTIDINE IN NACL 20-0.9 MG/50ML-% IV SOLN
20.0000 mg | Freq: Once | INTRAVENOUS | Status: AC
  Administered 2013-10-11: 20 mg via INTRAVENOUS

## 2013-10-11 MED ORDER — DEXAMETHASONE SODIUM PHOSPHATE 20 MG/5ML IJ SOLN
20.0000 mg | Freq: Once | INTRAMUSCULAR | Status: AC
  Administered 2013-10-11: 20 mg via INTRAVENOUS

## 2013-10-11 MED ORDER — ONDANSETRON 8 MG/NS 50 ML IVPB
INTRAVENOUS | Status: AC
Start: 2013-10-11 — End: 2013-10-11
  Filled 2013-10-11: qty 8

## 2013-10-11 MED ORDER — FAMOTIDINE IN NACL 20-0.9 MG/50ML-% IV SOLN
INTRAVENOUS | Status: AC
  Filled 2013-10-11: qty 50

## 2013-10-11 MED ORDER — PACLITAXEL CHEMO INJECTION 300 MG/50ML
80.0000 mg/m2 | Freq: Once | INTRAVENOUS | Status: AC
  Administered 2013-10-11: 162 mg via INTRAVENOUS
  Filled 2013-10-11: qty 27

## 2013-10-11 MED ORDER — DEXAMETHASONE SODIUM PHOSPHATE 20 MG/5ML IJ SOLN
INTRAMUSCULAR | Status: AC
  Filled 2013-10-11: qty 5

## 2013-10-11 MED ORDER — SODIUM CHLORIDE 0.9 % IV SOLN
Freq: Once | INTRAVENOUS | Status: AC
  Administered 2013-10-11: 14:00:00 via INTRAVENOUS

## 2013-10-11 MED ORDER — ACETAMINOPHEN 325 MG PO TABS
650.0000 mg | ORAL_TABLET | Freq: Once | ORAL | Status: AC
  Administered 2013-10-11: 650 mg via ORAL

## 2013-10-11 MED ORDER — ONDANSETRON 8 MG/50ML IVPB (CHCC)
8.0000 mg | Freq: Once | INTRAVENOUS | Status: AC
  Administered 2013-10-11: 8 mg via INTRAVENOUS

## 2013-10-11 NOTE — Assessment & Plan Note (Signed)
Left breast invasive ductal carcinoma ER/PR positive HER-2 amplified heterogeneously. Even though the tumor was 1.8 cm, the HER-2 component was on the third of it. Hence we recommended giving adjuvant chemotherapy with Taxol Herceptin. Today is cycle 1 day 8. She developed mild tingling of the feet for one to 2 days which got better. She had one episode of nausea and vomiting yesterday and today she is better. She denies any alopecia. Overall she feels she tolerated the treatment very well.  I reviewed her blood work which is excellent and she will go on to receive second week of chemotherapy. She will return back to see [place] in one week to see Mendel Ryder. I will be seeing her on alternate treatment visits.  Continuing to monitor her closely for toxicities.

## 2013-10-11 NOTE — Telephone Encounter (Signed)
gv adn printed appt sched and avs for pt for Sept thru [facility]

## 2013-10-11 NOTE — Patient Instructions (Signed)
[facility] Discharge Instructions for Patients Receiving Chemotherapy  Today you received the following chemotherapy agents:  Herceptin and Taxol  To help prevent nausea and vomiting after your treatment, we encourage you to take your nausea medication {CHL ONC A   If you develop nausea and vomiting that is not controlled by your nausea medication, call the clinic.   BELOW ARE SYMPTOMS THAT SHOULD BE REPORTED IMMEDIATELY:  *FEVER GREATER THAN 100.5 F  *CHILLS WITH OR WITHOUT FEVER  NAUSEA AND VOMITING THAT IS NOT CONTROLLED WITH YOUR NAUSEA MEDICATION  *UNUSUAL SHORTNESS OF BREATH  *UNUSUAL BRUISING OR BLEEDING  TENDERNESS IN MOUTH AND THROAT WITH OR WITHOUT PRESENCE OF ULCERS  *URINARY PROBLEMS  *BOWEL PROBLEMS  UNUSUAL RASH Items with * indicate a potential emergency and should be followed up as soon as possible.  Feel free to call the clinic you have any questions or concerns. The clinic phone number is [phone].

## 2013-10-11 NOTE — Progress Notes (Signed)
Patient Care Team: Gavin Pound, MD as PCP - General (Family Medicine) [name], MD as Consulting Physician (General Surgery) Thea Silversmith, MD as Consulting Physician (Radiation Oncology) Gavin Pound, MD as Consulting Physician (Family Medicine) Rulon Eisenmenger, MD as Consulting Physician (Hematology and Oncology)  DIAGNOSIS: Breast cancer of upper-outer quadrant of left female breast   Primary site: Breast (Left)   Staging method: AJCC 7th Edition   Clinical: Stage IIA (T2, N0, cM0) signed by Rulon Eisenmenger, MD on 09/20/2013 10:12 AM   Pathologic: (T1c, N0, cM0)   Summary: Stage IIA (T1c, N0, cM0)   Clinical comments: Staged at breast conference 08/17/13.   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  The cancer was grade 2 and ER positive at 100%, PR+ at 91% and HER2- with a Ki67 of 47%   08/12/2013 Breast MRI 2.1 x 1.2 x 1.2 cm biopsy-proven invasive ductal carcinoma and ductal carcinoma in situ in the 9 o'clock position of the left breast   08/30/2013 Surgery Left Lumpectomy IDC with infiltrating lobular features. Pos for LVSI; 1 mm from nearest margin, DCIS 1 SLN Neg; ONCOTYPE Dx Rec score 17, 10 yr risk of recurrance is 11% with Tamoxifen alone    CHIEF COMPLIANT: Mild tingling and numbness transiently, emesis X one episode yesterday  INTERVAL HISTORY: Jean [name] is a 53 year old African American lady with above-mentioned history of stage I breast cancer which had heterogeneous HER-2 expression even though the tumor was 1.8 cm, the actual HER-2 component was only 0.6 cm and hence we recommended systemic therapy with Taxol Herceptin instead of TCH. She tolerated  week one very well without any major problems she had good energy good appetite toward the treatment week yesterday she had one episode of nausea and vomiting but today she is better. She had headaches related to antiemetics initially but she is tolerating that fine right now. Denies any abdominal pain diarrhea nausea or vomiting today.   REVIEW OF SYSTEMS:   Constitutional: Denies fevers, chills or abnormal weight loss Eyes: Denies blurriness of vision Ears, nose, mouth, throat, and face: Denies mucositis or sore throat Respiratory: Denies cough, dyspnea or wheezes Cardiovascular: Denies palpitation, chest discomfort or lower extremity swelling Gastrointestinal:  Denies nausea, heartburn or change in bowel habits Skin: Denies abnormal skin rashes Lymphatics: Denies new lymphadenopathy or easy bruising Neurological:Denies numbness, tingling or new weaknesses Behavioral/Psych: Mood is stable, no new changes  Breast:  denies any pain or lumps or nodules in either breasts All other systems were reviewed with the patient and are negative.  I have reviewed the past medical history, past surgical history, social history and family history with the patient and they are unchanged from previous note.  ALLERGIES:  is allergic to hydrocodone.  MEDICATIONS:  Current Outpatient Prescriptions  Medication Sig Dispense Refill  . cholecalciferol (VITAMIN D) 1000 UNITS tablet Take 1,000 Units by mouth daily.      . Cyanocobalamin (VITAMIN B 12 PO) Take 1 tablet by mouth daily.      Marland Kitchen dexamethasone (DECADRON) 4 MG tablet Take 1 tablet (4 mg total) by mouth 2 (two) times daily with a meal. Day after chemo for 2 days  30 tablet  1  . HYDROcodone-acetaminophen (NORCO/VICODIN) 5-325 MG per tablet       . lidocaine-prilocaine (EMLA) cream Apply 1 application topically as needed.  30 g  3  . LORazepam (ATIVAN) 0.5 MG tablet Take 1 tablet (0.5 mg total) by mouth every 6 (six) hours as needed (as needed for nausea  and vomiting).  30 tablet  0  . Multiple Vitamin (MULTIVITAMIN) tablet Take 1 tablet by mouth daily.      . ondansetron (ZOFRAN) 8 MG tablet Take 1 tablet (8 mg total) by mouth 2 (two) times daily.  30 tablet  1  . oxyCODONE-acetaminophen (PERCOCET) 10-325 MG per tablet Take 1 tablet by mouth every 4 (four) hours as needed for pain.  30 tablet  0  . prochlorperazine (COMPAZINE) 10 MG tablet Take 1 tablet (10 mg total) by mouth every 6 (six) hours as needed for nausea or vomiting.  30 tablet  1  . zolpidem (AMBIEN) 5 MG tablet Take 5 mg by mouth at bedtime as needed for sleep.       No current facility-administered medications for this visit.    PHYSICAL EXAMINATION: ECOG PERFORMANCE STATUS: 1 - Symptomatic but completely ambulatory  Filed Vitals:   10/11/13 1251  BP: 146/93  Pulse: 83  Temp: 98.2 F (36.8 C)  Resp: 18   Filed Weights   10/11/13 1251  Weight: 197 lb (89.359 kg)    GENERAL:alert, no distress and comfortable SKIN: skin color, texture, turgor are normal, no rashes or significant lesions EYES: normal, Conjunctiva are pink and non-injected, sclera clear OROPHARYNX:no exudate, no erythema and lips, buccal mucosa, and tongue normal  NECK: supple, thyroid normal size, non-tender, without nodularity LYMPH:  no palpable lymphadenopathy in the cervical, axillary or inguinal LUNGS: clear to auscultation and percussion with normal breathing effort HEART: regular rate & rhythm and no murmurs and no lower extremity edema ABDOMEN:abdomen soft, non-tender and normal bowel sounds Musculoskeletal:no cyanosis of digits and no clubbing  NEURO: alert & oriented x 3 with fluent speech, no focal motor/sensory deficits   LABORATORY DATA:  I have reviewed the data as listed   Chemistry      Component Value Date/Time   NA 140 10/11/2013 1237   NA 140 12/17/2007 0520   K 4.3 10/11/2013 1237   K 4.4 12/17/2007 0520   CL 105 12/17/2007 0520   CO2 26 10/11/2013 1237   CO2 31 12/17/2007  0520   BUN 15.9 10/11/2013 1237   BUN 5* 12/17/2007 0520   CREATININE 0.9 10/11/2013 1237   CREATININE 0.62 12/17/2007 0520      Component Value Date/Time   CALCIUM 8.9 10/11/2013 1237   CALCIUM 8.7 12/17/2007 0520   ALKPHOS 66 10/11/2013 1237   AST 16 10/11/2013 1237   ALT 21 10/11/2013 1237   BILITOT <0.20 10/11/2013 1237       Lab Results  Component Value Date   WBC 5.2 10/11/2013   HGB 11.5* 10/11/2013   HCT 35.7 10/11/2013   MCV 82.9 10/11/2013   PLT 396 10/11/2013   NEUTROABS 3.4 10/11/2013     RADIOGRAPHIC STUDIES: I have personally reviewed the radiology reports and agreed with their findings. No results found.   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 Even though the tumor was 1.8 cm, the HER-2 component was on the third of it. Hence we recommended giving adjuvant chemotherapy with Taxol Herceptin. Today is  cycle 1 day 8. She developed mild tingling of the feet for one to 2 days which got better. She had one episode of nausea and vomiting yesterday and today she is better. She denies any alopecia. Overall she feels she tolerated the treatment very well.  I reviewed her blood work which is excellent and she will go on to receive second week of chemotherapy. She will return back to see [place] in one week to see Mendel Ryder. I will be seeing her on alternate treatment visits.  Continuing to monitor her closely for toxicities.   Orders Placed This Encounter  Procedures  . CBC with Differential    Standing Status: Standing     Number of Occurrences: 11     Standing Expiration Date: 10/12/2014  . Comprehensive metabolic panel (Cmet) - [facility]    Standing Status: Standing     Number of Occurrences: 11     Standing Expiration Date: 10/12/2014   The patient has a good understanding of the overall plan. she agrees with it. She will call with any problems that may develop before her  next visit here.  I spent 25 minutes counseling the patient face to face. The total time spent in the appointment was 30 minutes and more than 50% was on counseling and review of test results    Rulon Eisenmenger, MD 10/11/2013 1:29 PM

## 2013-10-12 ENCOUNTER — Ambulatory Visit (HOSPITAL_COMMUNITY)
Admission: RE | Admit: 2013-10-12 | Discharge: 2013-10-12 | Disposition: A | Discharge status: Home / Self Care | Payer: BC Managed Care – PPO | Source: Ambulatory Visit | Attending: Cardiology | Admitting: Cardiology

## 2013-10-12 ENCOUNTER — Encounter: Payer: Self-pay | Admitting: Hematology and Oncology

## 2013-10-12 VITALS — BP 148/88 | HR 100 | Wt 199.2 lb

## 2013-10-12 DIAGNOSIS — Z79899 Other long term (current) drug therapy: Secondary | ICD-10-CM | POA: Diagnosis not present

## 2013-10-12 DIAGNOSIS — Z8249 Family history of ischemic heart disease and other diseases of the circulatory system: Secondary | ICD-10-CM | POA: Diagnosis not present

## 2013-10-12 DIAGNOSIS — Z87891 Personal history of nicotine dependence: Secondary | ICD-10-CM | POA: Insufficient documentation

## 2013-10-12 DIAGNOSIS — I10 Essential (primary) hypertension: Secondary | ICD-10-CM | POA: Insufficient documentation

## 2013-10-12 DIAGNOSIS — C50412 Malignant neoplasm of upper-outer quadrant of left female breast: Secondary | ICD-10-CM

## 2013-10-12 DIAGNOSIS — C50419 Malignant neoplasm of upper-outer quadrant of unspecified female breast: Secondary | ICD-10-CM | POA: Insufficient documentation

## 2013-10-12 NOTE — Progress Notes (Signed)
Faxed disability paper to [facility] @ [phone]

## 2013-10-12 NOTE — Progress Notes (Signed)
Faxed disability form to [employer] @ [phone] °

## 2013-10-12 NOTE — Patient Instructions (Signed)
Your physician recommends that you schedule a follow-up appointment in: November WITH AN ECHOCARDIOGRAM

## 2013-10-13 NOTE — Progress Notes (Signed)
Patient ID: Layal Javid, female   DOB: 07-30-1960, 53 y.o.   MRN: [id number] Primary Oncologist: Dr. Lindi Adie  53 yo presents to cardio-oncology clinic for evaluation in the setting of Herceptin use. Patient had left breast cancer diagnosed in 7/15.  ER+/PR+/HER-2+.  She had left lumpectomy in 7/15 and is undergoing adjuvant chemo with Taxol/Herceptin to be followed by radiation and Herceptin every 3 wks x 1 year.   So far, she is doing well.  No exertional chest pain or exertional dyspnea.  BP is mildly elevated.  Of note, patient has a strong family history of CAD.  PMH: 1. Depression 2. HTN: No meds at this time.  3. H/o hysterectomy 4. Breast cancer: left breast cancer diagnosed in 7/15.  ER+/PR+/HER-2+.  She had left lumpectomy in 7/15 and is undergoing adjuvant chemo with Taxol/Herceptin to be followed by radiation and Herceptin every 3 wks x 1 year.   - Echo (8/15) with EF 55-60%, grade II diastolic dysfunction, GLS -18%, lateral s' 11.4.   SH: Prior smoker, quit in 2010.  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    FH: Father with MI at 45 and CHF, maternal GF with MI, brother with CABG around age 67.    ROS: All systems reviewed and negative except as per HPI.   Current Outpatient Prescriptions  Medication Sig Dispense Refill  . cholecalciferol (VITAMIN D) 1000 UNITS tablet Take 1,000 Units by mouth daily.      . Cyanocobalamin (VITAMIN B 12 PO) Take 1 tablet by mouth daily.      Marland Kitchen dexamethasone (DECADRON) 4 MG tablet Take 1 tablet (4 mg total) by mouth 2 (two) times daily with a meal. Day after chemo for 2 days  30 tablet  1  . HYDROcodone-acetaminophen (NORCO/VICODIN) 5-325 MG per tablet       . lidocaine-prilocaine (EMLA) cream Apply 1 application topically as needed.  30 g  3  . LORazepam (ATIVAN) 0.5 MG tablet Take 1 tablet (0.5 mg total) by mouth every 6 (six) hours as needed (as needed for nausea and vomiting).  30 tablet  0  . Multiple Vitamin  (MULTIVITAMIN) tablet Take 1 tablet by mouth daily.      . ondansetron (ZOFRAN) 8 MG tablet Take 1 tablet (8 mg total) by mouth 2 (two) times daily.  30 tablet  1  . oxyCODONE-acetaminophen (PERCOCET) 10-325 MG per tablet Take 1 tablet by mouth every 4 (four) hours as needed for pain.  30 tablet  0  . prochlorperazine (COMPAZINE) 10 MG tablet Take 1 tablet (10 mg total) by mouth every 6 (six) hours as needed for nausea or vomiting.  30 tablet  1  . zolpidem (AMBIEN) 5 MG tablet Take 5 mg by mouth at bedtime as needed for sleep.       No current facility-administered medications for this encounter.    BP 148/88  Pulse 100  Wt 199 lb 4 oz (90.379 kg)  SpO2 99%  LMP 11/03/2008 General: NAD Neck: No JVD, no thyromegaly or thyroid nodule.  Lungs: Clear to auscultation bilaterally with normal respiratory effort. CV: Nondisplaced PMI.  Heart regular S1/S2, no S3/S4, no murmur.  No peripheral edema.  No carotid bruit.  Normal pedal pulses.  Abdomen: Soft, nontender, no hepatosplenomegaly, no distention.  Skin: Intact without lesions or rashes.  Neurologic: Alert and oriented x 3.  Psych: Normal affect. Extremities: No clubbing or cyanosis.  HEENT: Normal.   Assessment/Plan: 1.  Breast cancer: Patient will be getting Herceptin with her adjuvant chemotherapy then as a single agent for 1 year.  I talked to the patient today about the purpose of this clinic.  We discussed the approximately 10% risk of cardio-toxicity with Herceptin use.  We will plan to follow for this with regular echoes, monitoring EF, strain, and lateral s'.   - Patient will return for echo 11/15.  2. Family history of CAD: Patient has a strong FH of premature CAD.  BP is elevated a bit today.  Will need to follow this closely.  I would also have a low threshold to treat with a statin.   [name] 10/13/2013

## 2013-10-18 ENCOUNTER — Ambulatory Visit (HOSPITAL_BASED_OUTPATIENT_CLINIC_OR_DEPARTMENT_OTHER): Payer: BC Managed Care – PPO

## 2013-10-18 ENCOUNTER — Ambulatory Visit (HOSPITAL_BASED_OUTPATIENT_CLINIC_OR_DEPARTMENT_OTHER): Payer: BC Managed Care – PPO | Admitting: Hematology and Oncology

## 2013-10-18 ENCOUNTER — Other Ambulatory Visit (HOSPITAL_BASED_OUTPATIENT_CLINIC_OR_DEPARTMENT_OTHER): Payer: BC Managed Care – PPO

## 2013-10-18 ENCOUNTER — Telehealth: Payer: Self-pay | Admitting: *Deleted

## 2013-10-18 ENCOUNTER — Other Ambulatory Visit: Payer: BC Managed Care – PPO

## 2013-10-18 ENCOUNTER — Telehealth: Payer: Self-pay | Admitting: Hematology and Oncology

## 2013-10-18 VITALS — BP 147/96 | HR 97 | Temp 98.3°F | Resp 20 | Ht 66.0 in | Wt 200.8 lb

## 2013-10-18 DIAGNOSIS — C50412 Malignant neoplasm of upper-outer quadrant of left female breast: Secondary | ICD-10-CM

## 2013-10-18 DIAGNOSIS — C50919 Malignant neoplasm of unspecified site of unspecified female breast: Secondary | ICD-10-CM

## 2013-10-18 DIAGNOSIS — Z170 Estrogen receptor positive status [ER+]: Secondary | ICD-10-CM

## 2013-10-18 DIAGNOSIS — Z5111 Encounter for antineoplastic chemotherapy: Secondary | ICD-10-CM

## 2013-10-18 LAB — COMPREHENSIVE METABOLIC PANEL (CC13)
ALT: 20 U/L (ref 0–55)
AST: 15 U/L (ref 5–34)
Albumin: 3.5 g/dL (ref 3.5–5.0)
Alkaline Phosphatase: 70 U/L (ref 40–150)
Anion Gap: 10 mEq/L (ref 3–11)
BILIRUBIN TOTAL: 0.29 mg/dL (ref 0.20–1.20)
BUN: 12.4 mg/dL (ref 7.0–26.0)
CO2: 25 mEq/L (ref 22–29)
CREATININE: 0.9 mg/dL (ref 0.6–1.1)
Calcium: 8.8 mg/dL (ref 8.4–10.4)
Chloride: 107 mEq/L (ref 98–109)
Glucose: 115 mg/dl (ref 70–140)
Potassium: 3.5 mEq/L (ref 3.5–5.1)
Sodium: 142 mEq/L (ref 136–145)
Total Protein: 7 g/dL (ref 6.4–8.3)

## 2013-10-18 LAB — URINALYSIS, MICROSCOPIC - CHCC
Bilirubin (Urine): NEGATIVE
Blood: NEGATIVE
Glucose: NEGATIVE mg/dL
KETONES: NEGATIVE mg/dL
Leukocyte Esterase: NEGATIVE
Nitrite: NEGATIVE
PH: 7 (ref 4.6–8.0)
PROTEIN: NEGATIVE mg/dL
SPECIFIC GRAVITY, URINE: 1.01 (ref 1.003–1.035)
Urobilinogen, UR: 0.2 mg/dL (ref 0.2–1)

## 2013-10-18 LAB — CBC WITH DIFFERENTIAL/PLATELET
BASO%: 0 % (ref 0.0–2.0)
Basophils Absolute: 0 10*3/uL (ref 0.0–0.1)
EOS%: 1 % (ref 0.0–7.0)
Eosinophils Absolute: 0.1 10*3/uL (ref 0.0–0.5)
HCT: 34.6 % — ABNORMAL LOW (ref 34.8–46.6)
HGB: 11.3 g/dL — ABNORMAL LOW (ref 11.6–15.9)
LYMPH%: 31.5 % (ref 14.0–49.7)
MCH: 27 pg (ref 25.1–34.0)
MCHC: 32.7 g/dL (ref 31.5–36.0)
MCV: 82.8 fL (ref 79.5–101.0)
MONO#: 0.2 10*3/uL (ref 0.1–0.9)
MONO%: 4.1 % (ref 0.0–14.0)
NEUT#: 3.2 10*3/uL (ref 1.5–6.5)
NEUT%: 63.4 % (ref 38.4–76.8)
PLATELETS: 362 10*3/uL (ref 145–400)
RBC: 4.18 10*6/uL (ref 3.70–5.45)
RDW: 14.5 % (ref 11.2–14.5)
WBC: 5.1 10*3/uL (ref 3.9–10.3)
lymph#: 1.6 10*3/uL (ref 0.9–3.3)

## 2013-10-18 MED ORDER — DEXAMETHASONE SODIUM PHOSPHATE 20 MG/5ML IJ SOLN
INTRAMUSCULAR | Status: AC
  Filled 2013-10-18: qty 5

## 2013-10-18 MED ORDER — DIPHENHYDRAMINE HCL 50 MG/ML IJ SOLN
INTRAMUSCULAR | Status: AC
  Filled 2013-10-18: qty 1

## 2013-10-18 MED ORDER — FAMOTIDINE IN NACL 20-0.9 MG/50ML-% IV SOLN
INTRAVENOUS | Status: AC
  Filled 2013-10-18: qty 50

## 2013-10-18 MED ORDER — DIPHENHYDRAMINE HCL 50 MG/ML IJ SOLN
50.0000 mg | Freq: Once | INTRAMUSCULAR | Status: AC
  Administered 2013-10-18: 50 mg via INTRAVENOUS

## 2013-10-18 MED ORDER — ONDANSETRON 8 MG/50ML IVPB (CHCC)
8.0000 mg | Freq: Once | INTRAVENOUS | Status: AC
Start: 2013-10-18 — End: 2013-10-18
  Administered 2013-10-18: 8 mg via INTRAVENOUS

## 2013-10-18 MED ORDER — ACETAMINOPHEN 325 MG PO TABS
650.0000 mg | ORAL_TABLET | Freq: Once | ORAL | Status: AC
  Administered 2013-10-18: 650 mg via ORAL

## 2013-10-18 MED ORDER — DEXAMETHASONE SODIUM PHOSPHATE 20 MG/5ML IJ SOLN
20.0000 mg | Freq: Once | INTRAMUSCULAR | Status: AC
  Administered 2013-10-18: 20 mg via INTRAVENOUS

## 2013-10-18 MED ORDER — SODIUM CHLORIDE 0.9 % IV SOLN
Freq: Once | INTRAVENOUS | Status: AC
  Administered 2013-10-18: 14:00:00 via INTRAVENOUS

## 2013-10-18 MED ORDER — PACLITAXEL CHEMO INJECTION 300 MG/50ML
80.0000 mg/m2 | Freq: Once | INTRAVENOUS | Status: AC
  Administered 2013-10-18: 162 mg via INTRAVENOUS
  Filled 2013-10-18: qty 27

## 2013-10-18 MED ORDER — SODIUM CHLORIDE 0.9 % IJ SOLN
10.0000 mL | INTRAMUSCULAR | Status: DC | PRN
  Administered 2013-10-18: 10 mL
  Filled 2013-10-18: qty 10

## 2013-10-18 MED ORDER — HEPARIN SOD (PORK) LOCK FLUSH 100 UNIT/ML IV SOLN
500.0000 [IU] | Freq: Once | INTRAVENOUS | Status: AC | PRN
  Administered 2013-10-18: 500 [IU]
  Filled 2013-10-18: qty 5

## 2013-10-18 MED ORDER — FAMOTIDINE IN NACL 20-0.9 MG/50ML-% IV SOLN
20.0000 mg | Freq: Once | INTRAVENOUS | Status: AC
  Administered 2013-10-18: 20 mg via INTRAVENOUS

## 2013-10-18 MED ORDER — ACETAMINOPHEN 325 MG PO TABS
ORAL_TABLET | ORAL | Status: AC
  Filled 2013-10-18: qty 2

## 2013-10-18 MED ORDER — TRASTUZUMAB CHEMO INJECTION 440 MG
2.0000 mg/kg | Freq: Once | INTRAVENOUS | Status: AC
  Administered 2013-10-18: 189 mg via INTRAVENOUS
  Filled 2013-10-18: qty 9

## 2013-10-18 MED ORDER — ONDANSETRON 8 MG/NS 50 ML IVPB
INTRAVENOUS | Status: AC
  Filled 2013-10-18: qty 8

## 2013-10-18 NOTE — Assessment & Plan Note (Signed)
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 N0 M0 stage IA patient is currently on adjuvant chemotherapy with Taxol Herceptin. Today is week 3 of treatment.  Patient has tolerated last cycle very well with minimal side effects she had leg cramping but no neuropathy denies any nausea vomiting she had one episode of metallic taste in the mouth. I reviewed the blood work with her and the blood tests look normal. Return to clinic next week to see Mendel Ryder will follow her the week after. I discussed with her that neuropathy generally starts after 6-8 cycles of treatment and we will need to continue to watch and monitor her.

## 2013-10-18 NOTE — Telephone Encounter (Signed)
PEr scheduler I have adjusted appt

## 2013-10-18 NOTE — Patient Instructions (Addendum)
[facility] Discharge Instructions for Patients Receiving Chemotherapy  Today you received the following chemotherapy agents Taxol, Herceptin.   To help prevent nausea and vomiting after your treatment, we encourage you to take your nausea medication Compazine 10 mg and zofran 8 mg as ordered by provider.   If you develop nausea and vomiting that is not controlled by your nausea medication, call the clinic.   BELOW ARE SYMPTOMS THAT SHOULD BE REPORTED IMMEDIATELY:  *FEVER GREATER THAN 100.5 F  *CHILLS WITH OR WITHOUT FEVER  NAUSEA AND VOMITING THAT IS NOT CONTROLLED WITH YOUR NAUSEA MEDICATION  *UNUSUAL SHORTNESS OF BREATH  *UNUSUAL BRUISING OR BLEEDING  TENDERNESS IN MOUTH AND THROAT WITH OR WITHOUT PRESENCE OF ULCERS  *URINARY PROBLEMS  *BOWEL PROBLEMS  UNUSUAL RASH Items with * indicate a potential emergency and should be followed up as soon as possible.  Feel free to call the clinic should you have any questions or concerns. The clinic phone number is [phone].

## 2013-10-18 NOTE — Progress Notes (Signed)
Discharged at 1710, ambulatory in no distress with childhood friend.

## 2013-10-18 NOTE — Progress Notes (Signed)
Patient Care Team: Gavin Pound, MD as PCP - General (Family Medicine) [name], MD as Consulting Physician (General Surgery) Thea Silversmith, MD as Consulting Physician (Radiation Oncology) Gavin Pound, MD as Consulting Physician (Family Medicine) Rulon Eisenmenger, MD as Consulting Physician (Hematology and Oncology)  DIAGNOSIS: Breast cancer of upper-outer quadrant of left female breast   Primary site: Breast (Left)   Staging method: AJCC 7th Edition   Clinical: Stage IIA (T2, N0, cM0) signed by Rulon Eisenmenger, MD on 09/20/2013 10:12 AM   Pathologic: (T1c, N0, cM0)   Summary: Stage IIA (T1c, N0, cM0)   Clinical comments: Staged at breast conference 08/17/13.   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  The cancer was grade 2 and ER positive at 100%, PR+ at 91% and HER2- with a Ki67 of 47%   08/12/2013 Breast MRI 2.1 x 1.2 x 1.2 cm biopsy-proven invasive ductal carcinoma and ductal carcinoma in situ in the 9 o'clock position of the left breast   08/30/2013 Surgery Left Lumpectomy IDC with infiltrating lobular features. Pos for LVSI; 1 mm from nearest margin, DCIS 1 SLN Neg; ONCOTYPE Dx Rec score 17, 10 yr risk of recurrance is 11% with Tamoxifen alone    CHIEF COMPLIANT: Patient is here for week 3 of Taxol Herceptin  INTERVAL HISTORY: Mrs. Burpee is a 53 year old American lady with above-mentioned history of HER-2 positive breast cancer for which she underwent lumpectomy that revealed a heterogeneous HER-2 positivity. The overall HER-2 positive tumor was about half a centimeter in size. Hence we elected to give her systemic adjuvant chemotherapy with Taxol Herceptin. She  has tolerated 2 weeks of therapy fairly well with minimal side effects. She denies any nausea vomiting denies any neuropathy symptoms. Last cycle she had a few leg cramps but otherwise did very well.  REVIEW OF SYSTEMS:   Constitutional: Denies fevers, chills or abnormal weight loss Eyes: Denies blurriness of vision Ears, nose, mouth, throat, and face: Denies mucositis or sore throat Respiratory: Denies cough, dyspnea or wheezes Cardiovascular: Denies palpitation, chest discomfort or lower extremity swelling Gastrointestinal:  Denies nausea, heartburn or change in bowel habits Skin: Denies abnormal skin rashes Lymphatics: Denies new lymphadenopathy or easy bruising Neurological:Denies numbness, tingling or new weaknesses Behavioral/Psych: Mood is stable, no new changes  All other systems were reviewed with the patient and are negative.  I have reviewed the past medical history, past surgical history, social history and family history with the patient and they are unchanged from previous note.  ALLERGIES:  is allergic to hydrocodone.  MEDICATIONS:  Current Outpatient Prescriptions  Medication Sig Dispense Refill  . cholecalciferol (VITAMIN D) 1000 UNITS tablet Take 1,000 Units by mouth daily.      . Cyanocobalamin (VITAMIN B 12 PO) Take 1 tablet by mouth daily.      Marland Kitchen dexamethasone (DECADRON) 4 MG tablet Take 1 tablet (4 mg total) by mouth 2 (two) times daily with a meal. Day after chemo for 2 days  30 tablet  1  . HYDROcodone-acetaminophen (NORCO/VICODIN) 5-325 MG per tablet       . lidocaine-prilocaine (EMLA) cream Apply 1 application topically as needed.  30 g  3  . LORazepam (ATIVAN)  0.5 MG tablet Take 1 tablet (0.5 mg total) by mouth every 6 (six) hours as needed (as needed for nausea and vomiting).  30 tablet  0  . Multiple Vitamin (MULTIVITAMIN) tablet Take 1 tablet by mouth daily.      . ondansetron (ZOFRAN) 8 MG tablet Take 1 tablet (8 mg total) by mouth 2 (two) times daily.  30  tablet  1  . oxyCODONE-acetaminophen (PERCOCET) 10-325 MG per tablet Take 1 tablet by mouth every 4 (four) hours as needed for pain.  30 tablet  0  . prochlorperazine (COMPAZINE) 10 MG tablet Take 1 tablet (10 mg total) by mouth every 6 (six) hours as needed for nausea or vomiting.  30 tablet  1  . zolpidem (AMBIEN) 5 MG tablet Take 5 mg by mouth at bedtime as needed for sleep.       No current facility-administered medications for this visit.    PHYSICAL EXAMINATION: ECOG PERFORMANCE STATUS: 1 - Symptomatic but completely ambulatory  Filed Vitals:   10/18/13 1319  BP: 147/96  Pulse: 97  Temp: 98.3 F (36.8 C)  Resp: 20   Filed Weights   10/18/13 1319  Weight: 200 lb 12.8 oz (91.082 kg)    GENERAL:alert, no distress and comfortable SKIN: skin color, texture, turgor are normal, no rashes or significant lesions EYES: normal, Conjunctiva are pink and non-injected, sclera clear OROPHARYNX:no exudate, no erythema and lips, buccal mucosa, and tongue normal  NECK: supple, thyroid normal size, non-tender, without nodularity LYMPH:  no palpable lymphadenopathy in the cervical, axillary or inguinal LUNGS: clear to auscultation and percussion with normal breathing effort HEART: regular rate & rhythm and no murmurs and no lower extremity edema ABDOMEN:abdomen soft, non-tender and normal bowel sounds Musculoskeletal:no cyanosis of digits and no clubbing  NEURO: alert & oriented x 3 with fluent speech, no focal motor/sensory deficits  LABORATORY DATA:  I have reviewed the data as listed   Chemistry      Component Value Date/Time   NA 142 10/18/2013 1259   NA 140 12/17/2007 0520   K 3.5 10/18/2013 1259   K 4.4 12/17/2007 0520   CL 105 12/17/2007 0520   CO2 25 10/18/2013 1259   CO2 31 12/17/2007 0520   BUN 12.4 10/18/2013 1259   BUN 5* 12/17/2007 0520   CREATININE 0.9 10/18/2013 1259   CREATININE 0.62 12/17/2007 0520      Component Value Date/Time   CALCIUM 8.8 10/18/2013 1259    CALCIUM 8.7 12/17/2007 0520   ALKPHOS 70 10/18/2013 1259   AST 15 10/18/2013 1259   ALT 20 10/18/2013 1259   BILITOT 0.29 10/18/2013 1259       Lab Results  Component Value Date   WBC 5.1 10/18/2013   HGB 11.3* 10/18/2013   HCT 34.6* 10/18/2013   MCV 82.8 10/18/2013   PLT 362 10/18/2013   NEUTROABS 3.2 10/18/2013     RADIOGRAPHIC STUDIES: I have personally reviewed the radiology reports and agreed with their findings. No results found.   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 N0 M0 stage IA patient is currently on adjuvant chemotherapy with Taxol Herceptin. Today is week 3 of treatment.  Patient has tolerated last cycle very well with minimal side effects she had leg cramping but no neuropathy denies any nausea vomiting she had one episode of metallic taste in the mouth. I reviewed the blood work with her  and the blood tests look normal. Return to clinic next week to see Mendel Ryder will follow her the week after. I discussed with her that neuropathy generally starts after 6-8 cycles of treatment and we will need to continue to watch and monitor her.  Monitoring very closely for chemotherapy related toxicities  Orders Placed This Encounter  Procedures  . Urine culture    Standing Status: Future     Number of Occurrences: 1     Standing Expiration Date: 10/18/2014  . Urinalysis, Microscopic - [facility]    Standing Status: Future     Number of Occurrences: 1     Standing Expiration Date: 10/18/2014   The patient has a good understanding of the overall plan. she agrees with it. She will call with any problems that may develop before her next visit here.  I spent 25 minutes counseling the patient face to face. The total time spent in the appointment was 30 minutes and more than 50% was on counseling and review of test results    Rulon Eisenmenger, MD 10/18/2013 2:14 PM

## 2013-10-18 NOTE — Telephone Encounter (Signed)
, °

## 2013-10-19 ENCOUNTER — Encounter (INDEPENDENT_AMBULATORY_CARE_PROVIDER_SITE_OTHER): Payer: BC Managed Care – PPO | Admitting: Surgery

## 2013-10-19 LAB — URINE CULTURE

## 2013-10-21 ENCOUNTER — Other Ambulatory Visit: Payer: Self-pay | Admitting: Hematology and Oncology

## 2013-10-25 ENCOUNTER — Other Ambulatory Visit: Payer: BC Managed Care – PPO

## 2013-10-25 ENCOUNTER — Encounter: Payer: Self-pay | Admitting: Adult Health

## 2013-10-25 ENCOUNTER — Ambulatory Visit (HOSPITAL_BASED_OUTPATIENT_CLINIC_OR_DEPARTMENT_OTHER): Payer: BC Managed Care – PPO | Admitting: Adult Health

## 2013-10-25 ENCOUNTER — Ambulatory Visit (HOSPITAL_BASED_OUTPATIENT_CLINIC_OR_DEPARTMENT_OTHER): Payer: BC Managed Care – PPO

## 2013-10-25 ENCOUNTER — Other Ambulatory Visit (HOSPITAL_BASED_OUTPATIENT_CLINIC_OR_DEPARTMENT_OTHER): Payer: BC Managed Care – PPO

## 2013-10-25 ENCOUNTER — Telehealth: Payer: Self-pay | Admitting: *Deleted

## 2013-10-25 VITALS — BP 152/102 | HR 84 | Temp 98.5°F | Resp 18 | Ht 66.0 in | Wt 198.6 lb

## 2013-10-25 DIAGNOSIS — C50412 Malignant neoplasm of upper-outer quadrant of left female breast: Secondary | ICD-10-CM

## 2013-10-25 DIAGNOSIS — C50919 Malignant neoplasm of unspecified site of unspecified female breast: Secondary | ICD-10-CM

## 2013-10-25 DIAGNOSIS — T451X5A Adverse effect of antineoplastic and immunosuppressive drugs, initial encounter: Secondary | ICD-10-CM

## 2013-10-25 DIAGNOSIS — D6481 Anemia due to antineoplastic chemotherapy: Secondary | ICD-10-CM

## 2013-10-25 DIAGNOSIS — I10 Essential (primary) hypertension: Secondary | ICD-10-CM

## 2013-10-25 DIAGNOSIS — Z5112 Encounter for antineoplastic immunotherapy: Secondary | ICD-10-CM

## 2013-10-25 DIAGNOSIS — Z5111 Encounter for antineoplastic chemotherapy: Secondary | ICD-10-CM

## 2013-10-25 LAB — CBC WITH DIFFERENTIAL/PLATELET
BASO%: 0 % (ref 0.0–2.0)
Basophils Absolute: 0 10*3/uL (ref 0.0–0.1)
EOS ABS: 0 10*3/uL (ref 0.0–0.5)
EOS%: 0.5 % (ref 0.0–7.0)
HCT: 35.5 % (ref 34.8–46.6)
HEMOGLOBIN: 11.5 g/dL — AB (ref 11.6–15.9)
LYMPH%: 41.8 % (ref 14.0–49.7)
MCH: 26.6 pg (ref 25.1–34.0)
MCHC: 32.4 g/dL (ref 31.5–36.0)
MCV: 82.2 fL (ref 79.5–101.0)
MONO#: 0.2 10*3/uL (ref 0.1–0.9)
MONO%: 5.9 % (ref 0.0–14.0)
NEUT%: 51.8 % (ref 38.4–76.8)
NEUTROS ABS: 1.9 10*3/uL (ref 1.5–6.5)
Platelets: 331 10*3/uL (ref 145–400)
RBC: 4.32 10*6/uL (ref 3.70–5.45)
RDW: 14.8 % — AB (ref 11.2–14.5)
WBC: 3.7 10*3/uL — AB (ref 3.9–10.3)
lymph#: 1.6 10*3/uL (ref 0.9–3.3)

## 2013-10-25 LAB — COMPREHENSIVE METABOLIC PANEL (CC13)
ALT: 23 U/L (ref 0–55)
ANION GAP: 7 meq/L (ref 3–11)
AST: 17 U/L (ref 5–34)
Albumin: 3.6 g/dL (ref 3.5–5.0)
Alkaline Phosphatase: 62 U/L (ref 40–150)
BUN: 13.5 mg/dL (ref 7.0–26.0)
CALCIUM: 9.3 mg/dL (ref 8.4–10.4)
CO2: 27 meq/L (ref 22–29)
Chloride: 106 mEq/L (ref 98–109)
Creatinine: 0.8 mg/dL (ref 0.6–1.1)
GLUCOSE: 113 mg/dL (ref 70–140)
POTASSIUM: 4 meq/L (ref 3.5–5.1)
Sodium: 139 mEq/L (ref 136–145)
Total Bilirubin: 0.21 mg/dL (ref 0.20–1.20)
Total Protein: 7.1 g/dL (ref 6.4–8.3)

## 2013-10-25 MED ORDER — DEXAMETHASONE SODIUM PHOSPHATE 20 MG/5ML IJ SOLN
INTRAMUSCULAR | Status: AC
  Filled 2013-10-25: qty 5

## 2013-10-25 MED ORDER — ACETAMINOPHEN 325 MG PO TABS
650.0000 mg | ORAL_TABLET | Freq: Once | ORAL | Status: AC
  Administered 2013-10-25: 650 mg via ORAL

## 2013-10-25 MED ORDER — ONDANSETRON 8 MG/NS 50 ML IVPB
INTRAVENOUS | Status: AC
  Filled 2013-10-25: qty 8

## 2013-10-25 MED ORDER — ONDANSETRON 8 MG/50ML IVPB (CHCC)
8.0000 mg | Freq: Once | INTRAVENOUS | Status: AC
  Administered 2013-10-25: 8 mg via INTRAVENOUS

## 2013-10-25 MED ORDER — HEPARIN SOD (PORK) LOCK FLUSH 100 UNIT/ML IV SOLN
500.0000 [IU] | Freq: Once | INTRAVENOUS | Status: AC | PRN
  Administered 2013-10-25: 500 [IU]
  Filled 2013-10-25: qty 5

## 2013-10-25 MED ORDER — DEXAMETHASONE SODIUM PHOSPHATE 20 MG/5ML IJ SOLN
20.0000 mg | Freq: Once | INTRAMUSCULAR | Status: AC
  Administered 2013-10-25: 20 mg via INTRAVENOUS

## 2013-10-25 MED ORDER — SODIUM CHLORIDE 0.9 % IJ SOLN
10.0000 mL | INTRAMUSCULAR | Status: DC | PRN
  Administered 2013-10-25: 10 mL
  Filled 2013-10-25: qty 10

## 2013-10-25 MED ORDER — TRASTUZUMAB CHEMO INJECTION 440 MG
2.0000 mg/kg | Freq: Once | INTRAVENOUS | Status: AC
  Administered 2013-10-25: 189 mg via INTRAVENOUS
  Filled 2013-10-25: qty 9

## 2013-10-25 MED ORDER — DIPHENHYDRAMINE HCL 50 MG/ML IJ SOLN
INTRAMUSCULAR | Status: AC
  Filled 2013-10-25: qty 1

## 2013-10-25 MED ORDER — ACETAMINOPHEN 325 MG PO TABS
ORAL_TABLET | ORAL | Status: AC
  Filled 2013-10-25: qty 2

## 2013-10-25 MED ORDER — FAMOTIDINE IN NACL 20-0.9 MG/50ML-% IV SOLN
20.0000 mg | Freq: Once | INTRAVENOUS | Status: AC
  Administered 2013-10-25: 20 mg via INTRAVENOUS

## 2013-10-25 MED ORDER — DIPHENHYDRAMINE HCL 50 MG/ML IJ SOLN
50.0000 mg | Freq: Once | INTRAMUSCULAR | Status: AC
  Administered 2013-10-25: 50 mg via INTRAVENOUS

## 2013-10-25 MED ORDER — LISINOPRIL 10 MG PO TABS: 10.0000 mg | ORAL_TABLET | Freq: Every day | ORAL | Status: DC

## 2013-10-25 MED ORDER — FAMOTIDINE IN NACL 20-0.9 MG/50ML-% IV SOLN
INTRAVENOUS | Status: AC
  Filled 2013-10-25: qty 50

## 2013-10-25 MED ORDER — SODIUM CHLORIDE 0.9 % IV SOLN
Freq: Once | INTRAVENOUS | Status: AC
  Administered 2013-10-25: 13:00:00 via INTRAVENOUS

## 2013-10-25 MED ORDER — PACLITAXEL CHEMO INJECTION 300 MG/50ML
80.0000 mg/m2 | Freq: Once | INTRAVENOUS | Status: AC
  Administered 2013-10-25: 162 mg via INTRAVENOUS
  Filled 2013-10-25: qty 27

## 2013-10-25 NOTE — Progress Notes (Signed)
Jean [name] [name] Care Team: Gavin Pound, MD as PCP - General (Family Medicine) [name], MD as Consulting Physician (General Surgery) Thea Silversmith, MD as Consulting Physician (Radiation Oncology) Gavin Pound, MD as Consulting Physician (Family Medicine) Rulon Eisenmenger, MD as Consulting Physician (Hematology and Oncology)  DIAGNOSIS: Breast cancer of upper-outer quadrant of left female breast   Primary site: Breast (Left)   Staging method: AJCC 7th Edition   Clinical: Stage IIA (T2, N0, cM0) signed by Rulon Eisenmenger, MD on 09/20/2013 10:12 AM   Pathologic: (T1c, N0, cM0)   Summary: Stage IIA (T1c, N0, cM0)   Clinical comments: Staged at breast conference 08/17/13.   SUMMARY OF ONCOLOGIC HISTORY:   Breast cancer of upper-outer quadrant of left female breast   07/22/2013 Mammogram 1.8 cm mass with mammographic and ultrasound features suspicious for malignancy in Jean [name] 9 o'clock position of Jean [name] left breast   08/12/2013 Initial Diagnosis Breast cancer of upper-outer quadrant of left female breast.  invasive ductal carcinoma plus DCIS.  Jean [name] cancer was grade 2 and ER positive at 100%, PR+ at 91% and HER2- with a Ki67 of 47%   08/12/2013 Breast MRI 2.1 x 1.2 x 1.2 cm biopsy-proven invasive ductal carcinoma and ductal carcinoma in situ in Jean [name] 9 o'clock position of Jean [name] left breast   08/30/2013 Surgery Left Lumpectomy IDC with infiltrating lobular features. Pos for LVSI; 1 mm from nearest margin, DCIS 1 SLN Neg; ONCOTYPE Dx Rec score 17, 10 yr risk of recurrance is 11% with Tamoxifen alone   10/04/2013 -  Chemotherapy Weekly Taxol/Jean [name] [name] x 12    CHIEF COMPLIANT: Jean [name] [name] is here for week 4 of Taxol Jean [name] [name]  INTERVAL HISTORY: Jean [name] Jean [name] [name] is a 53 year old American lady with above-mentioned history of HER-2 positive breast cancer.  Jean [name] Jean [name] [name] and is tolerating it very well.  She does continue to have hypertension.  She denies headaches, vision changes, nausea, vomiting,  constipation, diarrhea, numbness/tingling or any further concerns.    REVIEW OF SYSTEMS:   A 10 point review of systems was conducted and is otherwise negative except for what is noted above.     Past Medical History  Diagnosis Date  . GERD (gastroesophageal reflux disease)     no current med.  . Dental crown present   . Arthritis     back  . Breast cancer 08/2013    left   Past Surgical History  Procedure Laterality Date  . Foot surgery Right   . Abdominal hysterectomy  12/16/2007  . Lumpectory left  August 30, 2013  . Portacath placement Right 09/26/2013    Procedure: INSERTION PORT-A-CATH;  Surgeon: Shann Medal, MD;  Location: [facility];  Service: General;  Laterality: Right;   Family History  Problem Relation Age of Onset  . Breast cancer Paternal Aunt   . Cervical cancer Paternal Aunt   . Colon cancer Paternal Uncle   . Prostate cancer Paternal Uncle      ALLERGIES:  is allergic to hydrocodone.  MEDICATIONS:  Current Outpatient Prescriptions  Medication Sig Dispense Refill  . cholecalciferol (VITAMIN D) 1000 UNITS tablet Take 1,000 Units by mouth daily.      . Cyanocobalamin (VITAMIN B 12 PO) Take 1 tablet by mouth daily.      Marland Kitchen dexamethasone (DECADRON) 4 MG tablet Take 1 tablet (4 mg total) by mouth 2 (two) times daily with a meal. Day after chemo for 2 days  30 tablet  1  . lidocaine-prilocaine (EMLA) cream  Apply 1 application topically as needed.  30 g  3  . LORazepam (ATIVAN) 0.5 MG tablet Take 1 tablet (0.5 mg total) by mouth every 6 (six) hours as needed (as needed for nausea and vomiting).  30 tablet  0  . Multiple Vitamin (MULTIVITAMIN) tablet Take 1 tablet by mouth daily.      . ondansetron (ZOFRAN) 8 MG tablet Take 1 tablet (8 mg total) by mouth 2 (two) times daily.  30 tablet  1  . prochlorperazine (COMPAZINE) 10 MG tablet Take 1 tablet (10 mg total) by mouth every 6 (six) hours as needed for nausea or vomiting.  30 tablet  1  . zolpidem (AMBIEN) 5 MG tablet  Take 5 mg by mouth at bedtime as needed for sleep.      Marland Kitchen HYDROcodone-acetaminophen (NORCO/VICODIN) 5-325 MG per tablet       . lisinopril (PRINIVIL,ZESTRIL) 10 MG tablet Take 1 tablet (10 mg total) by mouth daily.  30 tablet  0  . oxyCODONE-acetaminophen (PERCOCET) 10-325 MG per tablet Take 1 tablet by mouth every 4 (four) hours as needed for pain.  30 tablet  0   No current facility-administered medications for this visit.   Facility-Administered Medications Ordered in Other Visits  Medication Dose Route Frequency Provider Last Rate Last Dose  . heparin lock flush 100 unit/mL  500 Units Intracatheter Once PRN Rulon Eisenmenger, MD      . PACLitaxel (TAXOL) 162 mg in dextrose 5 % 250 mL chemo infusion (</= 65m/m2)  80 mg/m2 (Treatment Plan Actual) Intravenous Once VRulon Eisenmenger MD 277 mL/hr at 10/25/13 1458 162 mg at 10/25/13 1458  . sodium chloride 0.9 % injection 10 mL  10 mL Intracatheter PRN VRulon Eisenmenger MD        PHYSICAL EXAMINATION: ECOG PERFORMANCE STATUS: 1 - Symptomatic but completely ambulatory  Filed Vitals:   10/25/13 1148  BP: 141/101  Pulse: 84  Temp: 98.5 F (36.9 C)  Resp: 18   Filed Weights   10/25/13 1148  Weight: 198 lb 9.6 oz (90.084 kg)   GENERAL: Jean [name] [name] is a well appearing female in no acute distress HEENT:  Sclerae anicteric.  Oropharynx clear and moist. No ulcerations or evidence of oropharyngeal candidiasis. Neck is supple.  NODES:  No cervical, supraclavicular, or axillary lymphadenopathy palpated.  BREAST EXAM:  Deferred. LUNGS:  Clear to auscultation bilaterally.  No wheezes or rhonchi. HEART:  Regular rate and rhythm. No murmur appreciated. ABDOMEN:  Soft, nontender.  Positive, normoactive bowel sounds. No organomegaly palpated. MSK:  No focal spinal tenderness to palpation. Full range of motion bilaterally in Jean [name] upper extremities. EXTREMITIES:  No peripheral edema.   SKIN:  Clear with no obvious rashes or skin changes. No nail  dyscrasia. NEURO:  Nonfocal. Well oriented.  Appropriate affect.    LABORATORY DATA:  I have reviewed Jean [name] data as listed   Chemistry      Component Value Date/Time   NA 139 10/25/2013 1133   NA 140 12/17/2007 0520   K 4.0 10/25/2013 1133   K 4.4 12/17/2007 0520   CL 105 12/17/2007 0520   CO2 27 10/25/2013 1133   CO2 31 12/17/2007 0520   BUN 13.5 10/25/2013 1133   BUN 5* 12/17/2007 0520   CREATININE 0.8 10/25/2013 1133   CREATININE 0.62 12/17/2007 0520      Component Value Date/Time   CALCIUM 9.3 10/25/2013 1133   CALCIUM 8.7 12/17/2007 0520   ALKPHOS 62 10/25/2013 1133   AST  17 10/25/2013 1133   ALT 23 10/25/2013 1133   BILITOT 0.21 10/25/2013 1133       Lab Results  Component Value Date   WBC 3.7* 10/25/2013   HGB 11.5* 10/25/2013   HCT 35.5 10/25/2013   MCV 82.2 10/25/2013   PLT 331 10/25/2013   NEUTROABS 1.9 10/25/2013     RADIOGRAPHIC STUDIES: I have personally reviewed Jean [name] radiology reports and agreed with their findings. No results found.   ASSESSMENT:  53 year old [place], [place] woman with   1. 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 Pathology demonstrated heterogeneity in regards to HER-2 expression. Lumpectomy date was on 08/30/13 by Dr. Lucia Gaskins.   2. Jean [name] [name] began weekly Paclitaxel and Trastuzumab therapy on 10/04/13. A total of 12 weeks are planned.   3. Radiation therapy to follow chemotherapy.   4. Anti-estrogen therapy to follow radiation therapy.     PLAN:   Jean [name] Jean [name] [name] is doing well today.  I reviewed her labs with her today, which are all normal with Jean [name] exception of a small treatment related anemia that we will continue to monitor.  I am concerned about her continued hypertension.  I spoke with Dr. Aundra Dubin Jean [name] cardiologist who saw her last on 9/9.  He recommends starting her on Lisinopril 80m daily.  I prescribed this and reviewed Jean [name] medication with her in detail.  She will start by  taking 552mdaily x 1 week and if she tolerates it well she will increase to 1034maily.  She knows to call for any questions or concerns.  She was given detailed information about this in her AVS.    DanKarrisll return next week for labs, evaluation, and treatment.    Jean [name] Jean [name] [name] has a good understanding of Jean [name] overall plan. she agrees with it. She will call with any problems that may develop before her next visit here.  I spent 25 minutes counseling Jean [name] Jean [name] [name] face to face. Jean [name] total time spent in Jean [name] appointment was 30 minutes and more than 50% was on counseling and review of test results  LinMinette HeadlandP [phone]/22/2015 3:56 PM

## 2013-10-25 NOTE — Patient Instructions (Signed)
Lisinopril tablets What is this medicine? LISINOPRIL (lyse IN oh pril) is an ACE inhibitor. This medicine is used to treat high blood pressure and heart failure. It is also used to protect the heart immediately after a heart attack. This medicine may be used for other purposes; ask your health care provider or pharmacist if you have questions. COMMON BRAND NAME(S): Prinivil, Zestril What should I tell my health care provider before I take this medicine? They need to know if you have any of these conditions: -diabetes -heart or blood vessel disease -immune system disease like lupus or scleroderma -kidney disease -low blood pressure -previous swelling of the tongue, face, or lips with difficulty breathing, difficulty swallowing, hoarseness, or tightening of the throat -an unusual or allergic reaction to lisinopril, other ACE inhibitors, insect venom, foods, dyes, or preservatives -pregnant or trying to get pregnant -breast-feeding How should I use this medicine? Take this medicine by mouth with a glass of water. Follow the directions on your prescription label. You may take this medicine with or without food. Take your medicine at regular intervals. Do not stop taking this medicine except on the advice of your doctor or health care professional. Talk to your pediatrician regarding the use of this medicine in children. Special care may be needed. While this drug may be prescribed for children as young as 30 years of age for selected conditions, precautions do apply. Overdosage: If you think you have taken too much of this medicine contact a poison control center or emergency room at once. NOTE: This medicine is only for you. Do not share this medicine with others. What if I miss a dose? If you miss a dose, take it as soon as you can. If it is almost time for your next dose, take only that dose. Do not take double or extra doses. What may interact with this medicine? -diuretics -lithium -NSAIDs,  medicines for pain and inflammation, like ibuprofen or naproxen -over-the-counter herbal supplements like hawthorn -potassium salts or potassium supplements -salt substitutes This list may not describe all possible interactions. Give your health care provider a list of all the medicines, herbs, non-prescription drugs, or dietary supplements you use. Also tell them if you smoke, drink alcohol, or use illegal drugs. Some items may interact with your medicine. What should I watch for while using this medicine? Visit your doctor or health care professional for regular check ups. Check your blood pressure as directed. Ask your doctor what your blood pressure should be, and when you should contact him or her. Call your doctor or health care professional if you notice an irregular or fast heart beat. Women should inform their doctor if they wish to become pregnant or think they might be pregnant. There is a potential for serious side effects to an unborn child. Talk to your health care professional or pharmacist for more information. Check with your doctor or health care professional if you get an attack of severe diarrhea, nausea and vomiting, or if you sweat a lot. The loss of too much body fluid can make it dangerous for you to take this medicine. You may get drowsy or dizzy. Do not drive, use machinery, or do anything that needs mental alertness until you know how this drug affects you. Do not stand or sit up quickly, especially if you are an older patient. This reduces the risk of dizzy or fainting spells. Alcohol can make you more drowsy and dizzy. Avoid alcoholic drinks. Avoid salt substitutes unless you are told  otherwise by your doctor or health care professional. Do not treat yourself for coughs, colds, or pain while you are taking this medicine without asking your doctor or health care professional for advice. Some ingredients may increase your blood pressure. What side effects may I notice from  receiving this medicine? Side effects that you should report to your doctor or health care professional as soon as possible: -abdominal pain with or without nausea or vomiting -allergic reactions like skin rash or hives, swelling of the hands, feet, face, lips, throat, or tongue -dark urine -difficulty breathing -dizzy, lightheaded or fainting spell -fever or sore throat -irregular heart beat, chest pain -pain or difficulty passing urine -redness, blistering, peeling or loosening of the skin, including inside the mouth -unusually weak -yellowing of the eyes or skin Side effects that usually do not require medical attention (report to your doctor or health care professional if they continue or are bothersome): -change in taste -cough -decreased sexual function or desire -headache -sun sensitivity -tiredness This list may not describe all possible side effects. Call your doctor for medical advice about side effects. You may report side effects to FDA at 1-800-FDA-1088. Where should I keep my medicine? Keep out of the reach of children. Store at room temperature between 15 and 30 degrees C (59 and 86 degrees F). Protect from moisture. Keep container tightly closed. Throw away any unused medicine after the expiration date. NOTE: This sheet is a summary. It may not cover all possible information. If you have questions about this medicine, talk to your doctor, pharmacist, or health care provider.  2015, Elsevier/Gold Standard. (2007-07-26 17:36:32)  

## 2013-10-25 NOTE — Patient Instructions (Signed)
[facility] Discharge Instructions for Patients Receiving Chemotherapy  Today you received the following chemotherapy agents Taxol, Herceptin.   To help prevent nausea and vomiting after your treatment, we encourage you to take your nausea medication Compazine 10 mg or zofran 8 mg as ordered by provider.   If you develop nausea and vomiting that is not controlled by your nausea medication, call the clinic.   BELOW ARE SYMPTOMS THAT SHOULD BE REPORTED IMMEDIATELY:  *FEVER GREATER THAN 100.5 F  *CHILLS WITH OR WITHOUT FEVER  NAUSEA AND VOMITING THAT IS NOT CONTROLLED WITH YOUR NAUSEA MEDICATION  *UNUSUAL SHORTNESS OF BREATH  *UNUSUAL BRUISING OR BLEEDING  TENDERNESS IN MOUTH AND THROAT WITH OR WITHOUT PRESENCE OF ULCERS  *URINARY PROBLEMS  *BOWEL PROBLEMS  UNUSUAL RASH Items with * indicate a potential emergency and should be followed up as soon as possible.  Feel free to call the clinic should you have any questions or concerns. The clinic phone number is [phone].

## 2013-10-25 NOTE — Telephone Encounter (Signed)
Per staff message and POF I have scheduled appts. Advised scheduler of appts. JMW  

## 2013-10-31 ENCOUNTER — Other Ambulatory Visit: Payer: Self-pay

## 2013-10-31 DIAGNOSIS — C50412 Malignant neoplasm of upper-outer quadrant of left female breast: Secondary | ICD-10-CM

## 2013-10-31 MED ORDER — LORAZEPAM 0.5 MG PO TABS: 0.5000 mg | ORAL_TABLET | Freq: Four times a day (QID) | ORAL | Status: DC | PRN

## 2013-11-01 ENCOUNTER — Ambulatory Visit (HOSPITAL_BASED_OUTPATIENT_CLINIC_OR_DEPARTMENT_OTHER): Payer: BC Managed Care – PPO | Admitting: Adult Health

## 2013-11-01 ENCOUNTER — Other Ambulatory Visit: Payer: BC Managed Care – PPO

## 2013-11-01 ENCOUNTER — Other Ambulatory Visit (HOSPITAL_BASED_OUTPATIENT_CLINIC_OR_DEPARTMENT_OTHER): Payer: BC Managed Care – PPO

## 2013-11-01 ENCOUNTER — Ambulatory Visit (HOSPITAL_BASED_OUTPATIENT_CLINIC_OR_DEPARTMENT_OTHER): Payer: BC Managed Care – PPO

## 2013-11-01 ENCOUNTER — Encounter: Payer: Self-pay | Admitting: Adult Health

## 2013-11-01 ENCOUNTER — Other Ambulatory Visit: Payer: Self-pay | Admitting: *Deleted

## 2013-11-01 VITALS — BP 147/107 | HR 54 | Temp 98.3°F | Resp 18 | Ht 66.0 in | Wt 200.6 lb

## 2013-11-01 DIAGNOSIS — K219 Gastro-esophageal reflux disease without esophagitis: Secondary | ICD-10-CM

## 2013-11-01 DIAGNOSIS — C50412 Malignant neoplasm of upper-outer quadrant of left female breast: Secondary | ICD-10-CM

## 2013-11-01 DIAGNOSIS — C50919 Malignant neoplasm of unspecified site of unspecified female breast: Secondary | ICD-10-CM

## 2013-11-01 DIAGNOSIS — Z170 Estrogen receptor positive status [ER+]: Secondary | ICD-10-CM

## 2013-11-01 DIAGNOSIS — F411 Generalized anxiety disorder: Secondary | ICD-10-CM

## 2013-11-01 DIAGNOSIS — G4700 Insomnia, unspecified: Secondary | ICD-10-CM

## 2013-11-01 DIAGNOSIS — Z5111 Encounter for antineoplastic chemotherapy: Secondary | ICD-10-CM

## 2013-11-01 DIAGNOSIS — IMO0001 Reserved for inherently not codable concepts without codable children: Secondary | ICD-10-CM

## 2013-11-01 LAB — CBC WITH DIFFERENTIAL/PLATELET
BASO%: 0.2 % (ref 0.0–2.0)
BASOS ABS: 0 10*3/uL (ref 0.0–0.1)
EOS%: 0.5 % (ref 0.0–7.0)
Eosinophils Absolute: 0 10*3/uL (ref 0.0–0.5)
HEMATOCRIT: 34.3 % — AB (ref 34.8–46.6)
HGB: 11.1 g/dL — ABNORMAL LOW (ref 11.6–15.9)
LYMPH#: 1.6 10*3/uL (ref 0.9–3.3)
LYMPH%: 35.9 % (ref 14.0–49.7)
MCH: 26.9 pg (ref 25.1–34.0)
MCHC: 32.4 g/dL (ref 31.5–36.0)
MCV: 83.3 fL (ref 79.5–101.0)
MONO#: 0.3 10*3/uL (ref 0.1–0.9)
MONO%: 6.1 % (ref 0.0–14.0)
NEUT#: 2.5 10*3/uL (ref 1.5–6.5)
NEUT%: 57.3 % (ref 38.4–76.8)
Platelets: 280 10*3/uL (ref 145–400)
RBC: 4.12 10*6/uL (ref 3.70–5.45)
RDW: 15.4 % — ABNORMAL HIGH (ref 11.2–14.5)
WBC: 4.4 10*3/uL (ref 3.9–10.3)

## 2013-11-01 LAB — COMPREHENSIVE METABOLIC PANEL (CC13)
ALK PHOS: 62 U/L (ref 40–150)
ALT: 28 U/L (ref 0–55)
AST: 15 U/L (ref 5–34)
Albumin: 3.6 g/dL (ref 3.5–5.0)
Anion Gap: 6 mEq/L (ref 3–11)
BILIRUBIN TOTAL: 0.26 mg/dL (ref 0.20–1.20)
BUN: 14.2 mg/dL (ref 7.0–26.0)
CO2: 26 mEq/L (ref 22–29)
CREATININE: 0.9 mg/dL (ref 0.6–1.1)
Calcium: 9.5 mg/dL (ref 8.4–10.4)
Chloride: 108 mEq/L (ref 98–109)
Glucose: 110 mg/dl (ref 70–140)
Potassium: 4.1 mEq/L (ref 3.5–5.1)
Sodium: 140 mEq/L (ref 136–145)
Total Protein: 7.1 g/dL (ref 6.4–8.3)

## 2013-11-01 MED ORDER — HEPARIN SOD (PORK) LOCK FLUSH 100 UNIT/ML IV SOLN
500.0000 [IU] | Freq: Once | INTRAVENOUS | Status: AC | PRN
  Administered 2013-11-01: 500 [IU]
  Filled 2013-11-01: qty 5

## 2013-11-01 MED ORDER — DIPHENHYDRAMINE HCL 50 MG/ML IJ SOLN
INTRAMUSCULAR | Status: AC
  Filled 2013-11-01: qty 1

## 2013-11-01 MED ORDER — ONDANSETRON 8 MG/NS 50 ML IVPB
INTRAVENOUS | Status: AC
  Filled 2013-11-01: qty 8

## 2013-11-01 MED ORDER — DIPHENHYDRAMINE HCL 50 MG/ML IJ SOLN
50.0000 mg | Freq: Once | INTRAMUSCULAR | Status: AC
  Administered 2013-11-01: 50 mg via INTRAVENOUS

## 2013-11-01 MED ORDER — FAMOTIDINE IN NACL 20-0.9 MG/50ML-% IV SOLN
20.0000 mg | Freq: Once | INTRAVENOUS | Status: AC
Start: 2013-11-01 — End: 2013-11-01
  Administered 2013-11-01: 20 mg via INTRAVENOUS

## 2013-11-01 MED ORDER — DEXAMETHASONE SODIUM PHOSPHATE 20 MG/5ML IJ SOLN
INTRAMUSCULAR | Status: AC
  Filled 2013-11-01: qty 5

## 2013-11-01 MED ORDER — SODIUM CHLORIDE 0.9 % IV SOLN
Freq: Once | INTRAVENOUS | Status: AC
  Administered 2013-11-01: 11:00:00 via INTRAVENOUS

## 2013-11-01 MED ORDER — OMEPRAZOLE 40 MG PO CPDR: 40.0000 mg | DELAYED_RELEASE_CAPSULE | Freq: Every day | ORAL | Status: DC

## 2013-11-01 MED ORDER — FAMOTIDINE IN NACL 20-0.9 MG/50ML-% IV SOLN
INTRAVENOUS | Status: AC
Start: 2013-11-01 — End: 2013-11-01
  Filled 2013-11-01: qty 50

## 2013-11-01 MED ORDER — TRASTUZUMAB CHEMO INJECTION 440 MG
2.0000 mg/kg | Freq: Once | INTRAVENOUS | Status: AC
  Administered 2013-11-01: 189 mg via INTRAVENOUS
  Filled 2013-11-01: qty 9

## 2013-11-01 MED ORDER — ACETAMINOPHEN 325 MG PO TABS
650.0000 mg | ORAL_TABLET | Freq: Once | ORAL | Status: AC
  Administered 2013-11-01: 650 mg via ORAL

## 2013-11-01 MED ORDER — PACLITAXEL CHEMO INJECTION 300 MG/50ML
80.0000 mg/m2 | Freq: Once | INTRAVENOUS | Status: AC
  Administered 2013-11-01: 162 mg via INTRAVENOUS
  Filled 2013-11-01: qty 27

## 2013-11-01 MED ORDER — DEXAMETHASONE SODIUM PHOSPHATE 20 MG/5ML IJ SOLN
20.0000 mg | Freq: Once | INTRAMUSCULAR | Status: AC
  Administered 2013-11-01: 20 mg via INTRAVENOUS

## 2013-11-01 MED ORDER — ONDANSETRON 8 MG/50ML IVPB (CHCC)
8.0000 mg | Freq: Once | INTRAVENOUS | Status: AC
  Administered 2013-11-01: 8 mg via INTRAVENOUS

## 2013-11-01 MED ORDER — SODIUM CHLORIDE 0.9 % IJ SOLN
10.0000 mL | INTRAMUSCULAR | Status: DC | PRN
  Administered 2013-11-01: 10 mL
  Filled 2013-11-01: qty 10

## 2013-11-01 MED ORDER — ACETAMINOPHEN 325 MG PO TABS
ORAL_TABLET | ORAL | Status: AC
  Filled 2013-11-01: qty 2

## 2013-11-01 NOTE — Patient Instructions (Signed)
Omeprazole tablets (OTC) What is this medicine? OMEPRAZOLE (oh ME pray zol) prevents the production of acid in the stomach. It is used to treat the symptoms of heartburn. You can buy this medicine without a prescription. This product is not for long-term use, unless otherwise directed by your doctor or health care professional. This medicine may be used for other purposes; ask your health care provider or pharmacist if you have questions. COMMON BRAND NAME(S): Prilosec OTC What should I tell my health care provider before I take this medicine? They need to know if you have any of these conditions: -black or bloody stools -chest pain -difficulty swallowing -have had heartburn for over 3 months -have heartburn with dizziness, lightheadedness or sweating -liver disease -stomach pain -unexplained weight loss -vomiting with blood -wheezing -an unusual or allergic reaction to omeprazole, other medicines, foods, dyes, or preservatives -pregnant or trying to get pregnant -breast-feeding How should I use this medicine? Take this medicine by mouth. Follow the directions on the product label. If you are taking this medicine without a prescription, take one tablet every day. Do not use for longer than 14 days or repeat a course of treatment more often than every 4 months unless directed by a doctor or healthcare professional. Take your dose at regular intervals every 24 hours. Swallow the tablet whole with a drink of water. Do not crush, break or chew. This medicine works best if taken on an empty stomach 30 minutes before breakfast. If you are using this medicine with the prescription of your doctor or healthcare professional, follow the directions you were given. Do not take your medicine more often than directed. Talk to your pediatrician regarding the use of this medicine in children. Special care may be needed. Overdosage: If you think you have taken too much of this medicine contact a poison control  center or emergency room at once. NOTE: This medicine is only for you. Do not share this medicine with others. What if I miss a dose? If you miss a dose, take it as soon as you can. If it is almost time for your next dose, take only that dose. Do not take double or extra doses. What may interact with this medicine? Do not take this medicine with any of the following medications: -atazanavir -clopidogrel -nelfinavir This medicine may also interact with the following medications: -ampicillin -certain medicines for anxiety or sleep -certain medicines that treat or prevent blood clots like warfarin -cyclosporine -diazepam -digoxin -disulfiram -iron salts -phenytoin -prescription medicine for fungal or yeast infection like itraconazole, ketoconazole, voriconazole -saquinavir -tacrolimus This list may not describe all possible interactions. Give your health care provider a list of all the medicines, herbs, non-prescription drugs, or dietary supplements you use. Also tell them if you smoke, drink alcohol, or use illegal drugs. Some items may interact with your medicine. What should I watch for while using this medicine? It can take several days before your heartburn gets better. Check with your doctor or health care professional if your condition does not start to get better, or if it gets worse. Do not treat diarrhea with over the counter products. Contact your doctor if you have diarrhea that lasts more than 2 days or if it is severe and watery. Do not treat yourself for heartburn with this medicine for more than 14 days in a row. You should only use this medicine for a 2-week treatment period once every 4 months. If your symptoms return shortly after your therapy is complete, or  within the 4 month time frame, call your doctor or health care professional. What side effects may I notice from receiving this medicine? Side effects that you should report to your doctor or health care professional  as soon as possible: -allergic reactions like skin rash, itching or hives, swelling of the face, lips, or tongue -bone, muscle or joint pain -breathing problems -chest pain or chest tightness -dark yellow or brown urine -diarrhea -dizziness -fast, irregular heartbeat -feeling faint or lightheaded -fever or sore throat -muscle spasm -palpitations -redness, blistering, peeling or loosening of the skin, including inside the mouth -seizures -tremors -unusual bleeding or bruising -unusually weak or tired -yellowing of the eyes or skin Side effects that usually do not require medical attention (Report these to your doctor or health care professional if they continue or are bothersome.): -constipation -dry mouth -headache -loose stools -nausea This list may not describe all possible side effects. Call your doctor for medical advice about side effects. You may report side effects to FDA at 1-800-FDA-1088. Where should I keep my medicine? Keep out of the reach of children. Store at room temperature between 20 and 25 degrees C (68 and 77 degrees F). Protect from light and moisture. Throw away any unused medicine after the expiration date. NOTE: This sheet is a summary. It may not cover all possible information. If you have questions about this medicine, talk to your doctor, pharmacist, or health care provider.  2015, Elsevier/Gold Standard. (2010-10-21 11:40:25)

## 2013-11-01 NOTE — Patient Instructions (Signed)
[facility] Discharge Instructions for Patients Receiving Chemotherapy  Today you received the following chemotherapy agents Herceptin/Taxol To help prevent nausea and vomiting after your treatment, we encourage you to take your nausea medication as prescribed.  If you develop nausea and vomiting that is not controlled by your nausea medication, call the clinic.   BELOW ARE SYMPTOMS THAT SHOULD BE REPORTED IMMEDIATELY:  *FEVER GREATER THAN 100.5 F  *CHILLS WITH OR WITHOUT FEVER  NAUSEA AND VOMITING THAT IS NOT CONTROLLED WITH YOUR NAUSEA MEDICATION  *UNUSUAL SHORTNESS OF BREATH  *UNUSUAL BRUISING OR BLEEDING  TENDERNESS IN MOUTH AND THROAT WITH OR WITHOUT PRESENCE OF ULCERS  *URINARY PROBLEMS  *BOWEL PROBLEMS  UNUSUAL RASH Items with * indicate a potential emergency and should be followed up as soon as possible.  Feel free to call the clinic you have any questions or concerns. The clinic phone number is [phone].    

## 2013-11-01 NOTE — Progress Notes (Signed)
Patient Care Team: Gavin Pound, MD as PCP - General (Family Medicine) [name], MD as Consulting Physician (General Surgery) Thea Silversmith, MD as Consulting Physician (Radiation Oncology) Gavin Pound, MD as Consulting Physician (Family Medicine) Rulon Eisenmenger, MD as Consulting Physician (Hematology and Oncology)  DIAGNOSIS: Breast cancer of upper-outer quadrant of left female breast   Primary site: Breast (Left)   Staging method: AJCC 7th Edition   Clinical: Stage IIA (T2, N0, cM0) signed by Rulon Eisenmenger, MD on 09/20/2013 10:12 AM   Pathologic: (T1c, N0, cM0)   Summary: Stage IIA (T1c, N0, cM0)   Clinical comments: Staged at breast conference 08/17/13.   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  The cancer was grade 2 and ER positive at 100%, PR+ at 91% and HER2- with a Ki67 of 47%   08/12/2013 Breast MRI 2.1 x 1.2 x 1.2 cm biopsy-proven invasive ductal carcinoma and ductal carcinoma in situ in the 9 o'clock position of the left breast   08/30/2013 Surgery Left Lumpectomy IDC with infiltrating lobular features. Pos for LVSI; 1 mm from nearest margin, DCIS 1 SLN Neg; ONCOTYPE Dx Rec score 17, 10 yr risk of recurrance is 11% with Tamoxifen alone   10/04/2013 -  Chemotherapy Weekly Taxol/Herceptin x 12    CHIEF COMPLIANT: Patient is here for week 5 of Taxol Herceptin  INTERVAL HISTORY: Jean [name] is a 53 year old American lady with above-mentioned history of HER-2 positive breast cancer. Jean [name] continues to receive Taxol and Herceptin and appears to be tolerating it well.  She did eat some fruit and chicken tenders after treatment last week and subsequently developed  an upset stomach.  She does have a slightly decreased appetite.  She is drinking a lot of water, however she does endorse that she thinks she isn't taking in enough calories per day.  Her weight is stable.  She c/o diarrhea, three times per day, with occasional abdominal cramping.  She has also noted that she has been increasingly anxious and attributes that to not picking up Lorazepam from the pharmacy.  She is having difficulty sleeping, however she is taking Ambien and her mind continues to race at night.  She says whatever she is thinking about at bedtime will turn into a crazy dream.  She reports increased indigestion and reflux that is becoming more and more constant.  She denies fevers, chills, nausea, vomiting, constipation, numbness/tingling, skin/nail changes or any further concerns.    REVIEW OF SYSTEMS:   A 10 point review of systems was conducted and is otherwise negative except for what is noted above.     Past Medical History  Diagnosis Date  . GERD (gastroesophageal reflux disease)     no current med.  . Dental crown present   . Arthritis     back  . Breast cancer 08/2013    left   Past Surgical History  Procedure Laterality Date  . Foot surgery Right   . Abdominal hysterectomy  12/16/2007  . Lumpectory left  August 30, 2013  . Portacath placement Right 09/26/2013    Procedure: INSERTION PORT-A-CATH;  Surgeon: Shann Medal, MD;  Location: [facility];  Service: General;  Laterality: Right;  Family History  Problem Relation Age of Onset  . Breast cancer Paternal Aunt   . Cervical cancer Paternal Aunt   . Colon cancer Paternal Uncle   . Prostate cancer Paternal Uncle      ALLERGIES:  is allergic to hydrocodone.  MEDICATIONS:  Current Outpatient Prescriptions  Medication Sig Dispense Refill  . cholecalciferol (VITAMIN D) 1000 UNITS tablet Take 1,000 Units by mouth daily.      . Cyanocobalamin (VITAMIN B 12 PO) Take 1 tablet by mouth daily.      Marland Kitchen dexamethasone  (DECADRON) 4 MG tablet Take 1 tablet (4 mg total) by mouth 2 (two) times daily with a meal. Day after chemo for 2 days  30 tablet  1  . lidocaine-prilocaine (EMLA) cream Apply 1 application topically as needed.  30 g  3  . lisinopril (PRINIVIL,ZESTRIL) 10 MG tablet Take 1 tablet (10 mg total) by mouth daily.  30 tablet  0  . LORazepam (ATIVAN) 0.5 MG tablet Take 1 tablet (0.5 mg total) by mouth every 6 (six) hours as needed (as needed for nausea and vomiting).  30 tablet  0  . Multiple Vitamin (MULTIVITAMIN) tablet Take 1 tablet by mouth daily.      . ondansetron (ZOFRAN) 8 MG tablet Take 1 tablet (8 mg total) by mouth 2 (two) times daily.  30 tablet  1  . prochlorperazine (COMPAZINE) 10 MG tablet Take 1 tablet (10 mg total) by mouth every 6 (six) hours as needed for nausea or vomiting.  30 tablet  1  . zolpidem (AMBIEN) 5 MG tablet Take 5 mg by mouth at bedtime as needed for sleep.      Marland Kitchen HYDROcodone-acetaminophen (NORCO/VICODIN) 5-325 MG per tablet       . omeprazole (PRILOSEC) 40 MG capsule Take 1 capsule (40 mg total) by mouth daily.  30 capsule  3  . oxyCODONE-acetaminophen (PERCOCET) 10-325 MG per tablet Take 1 tablet by mouth every 4 (four) hours as needed for pain.  30 tablet  0   No current facility-administered medications for this visit.    PHYSICAL EXAMINATION: ECOG PERFORMANCE STATUS: 1 - Symptomatic but completely ambulatory  Filed Vitals:   11/01/13 0930  BP: 147/107  Pulse: 54  Temp: 98.3 F (36.8 C)  Resp: 18   Filed Weights   11/01/13 0930  Weight: 200 lb 9.6 oz (90.992 kg)   GENERAL: Patient is a well appearing female in no acute distress HEENT:  Sclerae anicteric.  Oropharynx clear and moist. No ulcerations or evidence of oropharyngeal candidiasis. Neck is supple.  NODES:  No cervical, supraclavicular, or axillary lymphadenopathy palpated.  BREAST EXAM:  Deferred. LUNGS:  Clear to auscultation bilaterally.  No wheezes or rhonchi. HEART:  Regular rate and rhythm.  No murmur appreciated. ABDOMEN:  Soft, nontender.  Positive, normoactive bowel sounds. No organomegaly palpated. MSK:  No focal spinal tenderness to palpation. Full range of motion bilaterally in the upper extremities. EXTREMITIES:  No peripheral edema.   SKIN:  Clear with no obvious rashes or skin changes. No nail dyscrasia. NEURO:  Nonfocal. Well oriented.  Appropriate affect.    LABORATORY DATA:  I have reviewed the data as listed   Chemistry      Component Value Date/Time   NA 140 11/01/2013 0955   NA 140 12/17/2007 0520   K 4.1 11/01/2013 0955   K 4.4 12/17/2007 0520   CL 105 12/17/2007 0520   CO2 26 11/01/2013 0955   CO2 31 12/17/2007 0520  BUN 14.2 11/01/2013 0955   BUN 5* 12/17/2007 0520   CREATININE 0.9 11/01/2013 0955   CREATININE 0.62 12/17/2007 0520      Component Value Date/Time   CALCIUM 9.5 11/01/2013 0955   CALCIUM 8.7 12/17/2007 0520   ALKPHOS 62 11/01/2013 0955   AST 15 11/01/2013 0955   ALT 28 11/01/2013 0955   BILITOT 0.26 11/01/2013 0955       Lab Results  Component Value Date   WBC 4.4 11/01/2013   HGB 11.1* 11/01/2013   HCT 34.3* 11/01/2013   MCV 83.3 11/01/2013   PLT 280 11/01/2013   NEUTROABS 2.5 11/01/2013     RADIOGRAPHIC STUDIES: I have personally reviewed the radiology reports and agreed with their findings. No results found.   ASSESSMENT:  53 year old [place], [place] woman with   1. 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 Pathology demonstrated heterogeneity in regards to HER-2 expression. Lumpectomy date was on 08/30/13 by Dr. Lucia Gaskins.   2. Patient began weekly Paclitaxel and Trastuzumab therapy on 10/04/13. A total of 12 weeks are planned.   3. Radiation therapy to follow chemotherapy.   4. Anti-estrogen therapy to follow radiation therapy.     PLAN:   Jean [name] is doing well today.  Her lab work is pending.  Once her lab work has been reviewed, she will receive  treatment.    She does have diarrhea, which is likely treatment related.  She will use imodium for this as needed.    We discussed her anxiety in detail along with her coping mechanisms.  I counseled her on meditation and exercise to help alleviate stress.  I also suggested she consider taking an SSRI or SNRI for long term control of her anxiety.  She appears to be using Lorazepam, and I cautioned her on replacing normal coping mechanisms with the Lorazepam.  She verbalized understanding and will think about how we can more effectively work together in treating this.    In regards to the insomnia, she will try two Ambien tablets.  If that doesn't work, then it is likely her anxiety and we will discuss this again next week.    Jean [name] says that she is not taking in enough calories.  I did request Jean [name] call Jean [name] our nutritionist to see if there are any Ensure or Boost samples.    For Jean [name]'s reflux, I prescribed Omeprazole for her to take daily.  I counseled her on this medication in detail and she knows to take it first thing in the morning on an empty stomach.  Detailed information about Omeprazole was in her AVS.    Jean [name] will return next week for labs, evaluation, and treatment.    The patient has a good understanding of the overall plan. she agrees with it. She will call with any problems that may develop before her next visit here.  I spent 40 minutes counseling the patient face to face. The total time spent in the appointment was 50 minutes and more than 50% was on counseling and review of test results  Jean [name], [place] [phone] 11/01/2013 10:43 AM

## 2013-11-07 ENCOUNTER — Other Ambulatory Visit: Payer: Self-pay | Admitting: *Deleted

## 2013-11-08 ENCOUNTER — Ambulatory Visit (HOSPITAL_BASED_OUTPATIENT_CLINIC_OR_DEPARTMENT_OTHER): Payer: BC Managed Care – PPO | Admitting: Adult Health

## 2013-11-08 ENCOUNTER — Ambulatory Visit (HOSPITAL_BASED_OUTPATIENT_CLINIC_OR_DEPARTMENT_OTHER): Payer: BC Managed Care – PPO

## 2013-11-08 ENCOUNTER — Other Ambulatory Visit (HOSPITAL_BASED_OUTPATIENT_CLINIC_OR_DEPARTMENT_OTHER): Payer: BC Managed Care – PPO

## 2013-11-08 ENCOUNTER — Other Ambulatory Visit: Payer: BC Managed Care – PPO

## 2013-11-08 VITALS — BP 138/96 | HR 96 | Temp 98.2°F | Resp 18 | Ht 66.0 in | Wt 200.4 lb

## 2013-11-08 DIAGNOSIS — C50812 Malignant neoplasm of overlapping sites of left female breast: Secondary | ICD-10-CM

## 2013-11-08 DIAGNOSIS — Z5111 Encounter for antineoplastic chemotherapy: Secondary | ICD-10-CM

## 2013-11-08 DIAGNOSIS — Z5112 Encounter for antineoplastic immunotherapy: Secondary | ICD-10-CM

## 2013-11-08 DIAGNOSIS — C50412 Malignant neoplasm of upper-outer quadrant of left female breast: Secondary | ICD-10-CM

## 2013-11-08 DIAGNOSIS — D6481 Anemia due to antineoplastic chemotherapy: Secondary | ICD-10-CM

## 2013-11-08 LAB — CBC WITH DIFFERENTIAL/PLATELET
BASO%: 0.4 % (ref 0.0–2.0)
Basophils Absolute: 0 10*3/uL (ref 0.0–0.1)
EOS ABS: 0 10*3/uL (ref 0.0–0.5)
EOS%: 0.4 % (ref 0.0–7.0)
HCT: 32.6 % — ABNORMAL LOW (ref 34.8–46.6)
HGB: 10.5 g/dL — ABNORMAL LOW (ref 11.6–15.9)
LYMPH%: 33.4 % (ref 14.0–49.7)
MCH: 26.9 pg (ref 25.1–34.0)
MCHC: 32.4 g/dL (ref 31.5–36.0)
MCV: 83 fL (ref 79.5–101.0)
MONO#: 0.3 10*3/uL (ref 0.1–0.9)
MONO%: 6.8 % (ref 0.0–14.0)
NEUT#: 2.6 10*3/uL (ref 1.5–6.5)
NEUT%: 59 % (ref 38.4–76.8)
Platelets: 335 10*3/uL (ref 145–400)
RBC: 3.92 10*6/uL (ref 3.70–5.45)
RDW: 15.9 % — AB (ref 11.2–14.5)
WBC: 4.4 10*3/uL (ref 3.9–10.3)
lymph#: 1.5 10*3/uL (ref 0.9–3.3)

## 2013-11-08 LAB — COMPREHENSIVE METABOLIC PANEL (CC13)
ALBUMIN: 3.6 g/dL (ref 3.5–5.0)
ALT: 34 U/L (ref 0–55)
AST: 20 U/L (ref 5–34)
Alkaline Phosphatase: 59 U/L (ref 40–150)
Anion Gap: 6 mEq/L (ref 3–11)
BUN: 13.2 mg/dL (ref 7.0–26.0)
CO2: 26 mEq/L (ref 22–29)
Calcium: 9.3 mg/dL (ref 8.4–10.4)
Chloride: 108 mEq/L (ref 98–109)
Creatinine: 0.8 mg/dL (ref 0.6–1.1)
GLUCOSE: 110 mg/dL (ref 70–140)
POTASSIUM: 3.9 meq/L (ref 3.5–5.1)
SODIUM: 140 meq/L (ref 136–145)
TOTAL PROTEIN: 6.9 g/dL (ref 6.4–8.3)
Total Bilirubin: 0.2 mg/dL (ref 0.20–1.20)

## 2013-11-08 MED ORDER — DIPHENHYDRAMINE HCL 50 MG/ML IJ SOLN
INTRAMUSCULAR | Status: AC
  Filled 2013-11-08: qty 1

## 2013-11-08 MED ORDER — SODIUM CHLORIDE 0.9 % IJ SOLN
10.0000 mL | INTRAMUSCULAR | Status: DC | PRN
  Administered 2013-11-08: 10 mL
  Filled 2013-11-08: qty 10

## 2013-11-08 MED ORDER — SODIUM CHLORIDE 0.9 % IV SOLN
Freq: Once | INTRAVENOUS | Status: AC
  Administered 2013-11-08: 13:00:00 via INTRAVENOUS

## 2013-11-08 MED ORDER — ONDANSETRON 8 MG/50ML IVPB (CHCC)
8.0000 mg | Freq: Once | INTRAVENOUS | Status: AC
  Administered 2013-11-08: 8 mg via INTRAVENOUS

## 2013-11-08 MED ORDER — DEXAMETHASONE SODIUM PHOSPHATE 20 MG/5ML IJ SOLN
20.0000 mg | Freq: Once | INTRAMUSCULAR | Status: AC
  Administered 2013-11-08: 20 mg via INTRAVENOUS

## 2013-11-08 MED ORDER — DIPHENHYDRAMINE HCL 50 MG/ML IJ SOLN
50.0000 mg | Freq: Once | INTRAMUSCULAR | Status: AC
  Administered 2013-11-08: 50 mg via INTRAVENOUS

## 2013-11-08 MED ORDER — ACETAMINOPHEN 325 MG PO TABS
ORAL_TABLET | ORAL | Status: AC
  Filled 2013-11-08: qty 2

## 2013-11-08 MED ORDER — TRASTUZUMAB CHEMO INJECTION 440 MG
2.0000 mg/kg | Freq: Once | INTRAVENOUS | Status: AC
  Administered 2013-11-08: 189 mg via INTRAVENOUS
  Filled 2013-11-08: qty 9

## 2013-11-08 MED ORDER — ONDANSETRON 8 MG/NS 50 ML IVPB
INTRAVENOUS | Status: AC
  Filled 2013-11-08: qty 8

## 2013-11-08 MED ORDER — ACETAMINOPHEN 325 MG PO TABS
650.0000 mg | ORAL_TABLET | Freq: Once | ORAL | Status: AC
  Administered 2013-11-08: 650 mg via ORAL

## 2013-11-08 MED ORDER — HEPARIN SOD (PORK) LOCK FLUSH 100 UNIT/ML IV SOLN
500.0000 [IU] | Freq: Once | INTRAVENOUS | Status: AC | PRN
  Administered 2013-11-08: 500 [IU]
  Filled 2013-11-08: qty 5

## 2013-11-08 MED ORDER — FAMOTIDINE IN NACL 20-0.9 MG/50ML-% IV SOLN
INTRAVENOUS | Status: AC
  Filled 2013-11-08: qty 50

## 2013-11-08 MED ORDER — FAMOTIDINE IN NACL 20-0.9 MG/50ML-% IV SOLN
20.0000 mg | Freq: Once | INTRAVENOUS | Status: AC
  Administered 2013-11-08: 20 mg via INTRAVENOUS

## 2013-11-08 MED ORDER — PACLITAXEL CHEMO INJECTION 300 MG/50ML
80.0000 mg/m2 | Freq: Once | INTRAVENOUS | Status: AC
  Administered 2013-11-08: 162 mg via INTRAVENOUS
  Filled 2013-11-08: qty 27

## 2013-11-08 MED ORDER — DEXAMETHASONE SODIUM PHOSPHATE 20 MG/5ML IJ SOLN
INTRAMUSCULAR | Status: AC
  Filled 2013-11-08: qty 5

## 2013-11-08 NOTE — Patient Instructions (Signed)
[facility] Discharge Instructions for Patients Receiving Chemotherapy  Today you received the following chemotherapy agents :  Herceptin,  Taxol.  To help prevent nausea and vomiting after your treatment, we encourage you to take your nausea medication as prescribed by your physician.   If you develop nausea and vomiting that is not controlled by your nausea medication, call the clinic.   BELOW ARE SYMPTOMS THAT SHOULD BE REPORTED IMMEDIATELY:  *FEVER GREATER THAN 100.5 F  *CHILLS WITH OR WITHOUT FEVER  NAUSEA AND VOMITING THAT IS NOT CONTROLLED WITH YOUR NAUSEA MEDICATION  *UNUSUAL SHORTNESS OF BREATH  *UNUSUAL BRUISING OR BLEEDING  TENDERNESS IN MOUTH AND THROAT WITH OR WITHOUT PRESENCE OF ULCERS  *URINARY PROBLEMS  *BOWEL PROBLEMS  UNUSUAL RASH Items with * indicate a potential emergency and should be followed up as soon as possible.  Feel free to call the clinic you have any questions or concerns. The clinic phone number is [phone].

## 2013-11-08 NOTE — Progress Notes (Signed)
Patient Care Team: Gavin Pound, MD as PCP - General (Family Medicine) [name], MD as Consulting Physician (General Surgery) Thea Silversmith, MD as Consulting Physician (Radiation Oncology) Gavin Pound, MD as Consulting Physician (Family Medicine) Rulon Eisenmenger, MD as Consulting Physician (Hematology and Oncology)  DIAGNOSIS: Breast cancer of upper-outer quadrant of left female breast   Primary site: Breast (Left)   Staging method: AJCC 7th Edition   Clinical: Stage IIA (T2, N0, cM0) signed by Rulon Eisenmenger, MD on 09/20/2013 10:12 AM   Pathologic: (T1c, N0, cM0)   Summary: Stage IIA (T1c, N0, cM0)   Clinical comments: Staged at breast conference 08/17/13.   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  The cancer was grade 2 and ER positive at 100%, PR+ at 91% and HER2- with a Ki67 of 47%   08/12/2013 Breast MRI 2.1 x 1.2 x 1.2 cm biopsy-proven invasive ductal carcinoma and ductal carcinoma in situ in the 9 o'clock position of the left breast   08/30/2013 Surgery Left Lumpectomy IDC with infiltrating lobular features. Pos for LVSI; 1 mm from nearest margin, DCIS 1 SLN Neg; ONCOTYPE Dx Rec score 17, 10 yr risk of recurrance is 11% with Tamoxifen alone   10/04/2013 -  Chemotherapy Weekly Taxol/Herceptin x 12    CHIEF COMPLIANT: Patient is here for week 6 of Taxol Herceptin  INTERVAL HISTORY: Mrs. Jean [name] is a 53 year old American lady with above-mentioned history of HER-2 positive breast cancer. Burdette continues to receive Taxol and Herceptin and appears to be tolerating it well.  She did experience some numbness in her feet last night.  This woke her up out of her sleep.   She stated it was in her toes.  She stated that there was cramping in her toes, and then she experienced cramping in her calves.  She would like to take something different for anxiety than Lorazepam.  She does feel slightly more edgy than usual.  She denies any current numbness in her fingertips and toes.  She denies fevers, chills, nausea, vomiting, constipation, diarrhea, skin/nail changes, or any other concerns.    REVIEW OF SYSTEMS:   A 10 point review of systems was conducted and is otherwise negative except for what is noted above.     Past Medical History  Diagnosis Date  . GERD (gastroesophageal reflux disease)     no current med.  . Dental crown present   . Arthritis     back  . Breast cancer 08/2013    left   Past Surgical History  Procedure Laterality Date  . Foot surgery Right   . Abdominal hysterectomy  12/16/2007  . Lumpectory left  August 30, 2013  . Portacath placement Right 09/26/2013    Procedure: INSERTION PORT-A-CATH;  Surgeon: Shann Medal, MD;  Location: [facility];  Service: General;  Laterality: Right;   Family History  Problem Relation Age of Onset  . Breast cancer Paternal Aunt   . Cervical cancer Paternal Aunt   . Colon cancer Paternal Uncle   . Prostate cancer Paternal Uncle      ALLERGIES:  is allergic to hydrocodone.  MEDICATIONS:  Current Outpatient Prescriptions  Medication Sig Dispense Refill  .  cholecalciferol (VITAMIN D) 1000 UNITS tablet Take 1,000 Units by mouth daily.      . Cyanocobalamin (VITAMIN B 12 PO) Take 1 tablet by mouth daily.      Marland Kitchen dexamethasone (DECADRON) 4 MG tablet Take 1 tablet (4 mg total) by mouth 2 (two) times daily with a meal. Day after chemo for 2 days  30 tablet  1  . lidocaine-prilocaine (EMLA) cream Apply 1 application topically as needed.  30 g  3  . lisinopril (PRINIVIL,ZESTRIL) 10 MG tablet Take 1 tablet (10 mg total) by mouth daily.  30 tablet  0  . LORazepam (ATIVAN) 0.5 MG tablet Take 1 tablet (0.5 mg total) by  mouth every 6 (six) hours as needed (as needed for nausea and vomiting).  30 tablet  0  . Multiple Vitamin (MULTIVITAMIN) tablet Take 1 tablet by mouth daily.      Marland Kitchen omeprazole (PRILOSEC) 40 MG capsule Take 1 capsule (40 mg total) by mouth daily.  30 capsule  3  . ondansetron (ZOFRAN) 8 MG tablet Take 1 tablet (8 mg total) by mouth 2 (two) times daily.  30 tablet  1  . prochlorperazine (COMPAZINE) 10 MG tablet Take 1 tablet (10 mg total) by mouth every 6 (six) hours as needed for nausea or vomiting.  30 tablet  1  . zolpidem (AMBIEN) 5 MG tablet Take 5 mg by mouth at bedtime as needed for sleep.      Marland Kitchen HYDROcodone-acetaminophen (NORCO/VICODIN) 5-325 MG per tablet       . oxyCODONE-acetaminophen (PERCOCET) 10-325 MG per tablet Take 1 tablet by mouth every 4 (four) hours as needed for pain.  30 tablet  0  . UNABLE TO FIND Cranial Prothesis       No current facility-administered medications for this visit.    PHYSICAL EXAMINATION: ECOG PERFORMANCE STATUS: 1 - Symptomatic but completely ambulatory  Filed Vitals:   11/08/13 1134  BP: 138/96  Pulse: 96  Temp: 98.2 F (36.8 C)  Resp: 18   Filed Weights   11/08/13 1134  Weight: 200 lb 6.4 oz (90.901 kg)   GENERAL: Patient is a well appearing female in no acute distress HEENT:  Sclerae anicteric.  Oropharynx clear and moist. No ulcerations or evidence of oropharyngeal candidiasis. Neck is supple.  NODES:  No cervical, supraclavicular, or axillary lymphadenopathy palpated.  BREAST EXAM:  Deferred. LUNGS:  Clear to auscultation bilaterally.  No wheezes or rhonchi. HEART:  Regular rate and rhythm. No murmur appreciated. ABDOMEN:  Soft, nontender.  Positive, normoactive bowel sounds. No organomegaly palpated. MSK:  No focal spinal tenderness to palpation. Full range of motion bilaterally in the upper extremities. EXTREMITIES:  No peripheral edema.   SKIN:  Clear with no obvious rashes or skin changes. No nail dyscrasia. NEURO:  Nonfocal.  Well oriented.  Appropriate affect.    LABORATORY DATA:  I have reviewed the data as listed   Chemistry      Component Value Date/Time   NA 140 11/08/2013 1123   NA 140 12/17/2007 0520   K 3.9 11/08/2013 1123   K 4.4 12/17/2007 0520   CL 105 12/17/2007 0520   CO2 26 11/08/2013 1123   CO2 31 12/17/2007 0520   BUN 13.2 11/08/2013 1123   BUN 5* 12/17/2007 0520   CREATININE 0.8 11/08/2013 1123   CREATININE 0.62 12/17/2007 0520      Component Value Date/Time   CALCIUM 9.3 11/08/2013 1123   CALCIUM 8.7 12/17/2007 0520   ALKPHOS 59 11/08/2013  1123   AST 20 11/08/2013 1123   ALT 34 11/08/2013 1123   BILITOT 0.20 11/08/2013 1123       Lab Results  Component Value Date   WBC 4.4 11/08/2013   HGB 10.5* 11/08/2013   HCT 32.6* 11/08/2013   MCV 83.0 11/08/2013   PLT 335 11/08/2013   NEUTROABS 2.6 11/08/2013     RADIOGRAPHIC STUDIES: I have personally reviewed the radiology reports and agreed with their findings. No results found.   ASSESSMENT:  53 year old [place], [place] woman with   1. 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 Pathology demonstrated heterogeneity in regards to HER-2 expression. Lumpectomy date was on 08/30/13 by Dr. Lucia Gaskins.   2. Patient began weekly Paclitaxel and Trastuzumab therapy on 10/04/13. A total of 12 weeks are planned.   3. Radiation therapy to follow chemotherapy.   4. Anti-estrogen therapy to follow radiation therapy.     PLAN:   Cacey is doing well today.  I reviewed her labs with her in detail.  She does have a mild treatment related anemia that we will monitor.  She will proceed with week 6 of Taxol and Herceptin today.    Alyissa had one episode of strange neuropathy that sounds like it turned into cramping.  I recommended that she drink plenty of water.  Her electrolytes are stable today.  We will continue to monitor her very closely for any signs of neuropathy.    Glorine will return  next week for labs, evaluation, and treatment.    The patient has a good understanding of the overall plan. she agrees with it. She will call with any problems that may develop before her next visit here.  I spent 25 minutes counseling the patient face to face. The total time spent in the appointment was 30 minutes and more than 50% was on counseling and review of test results  Minette Headland, [place] [phone] 11/10/2013 8:29 AM

## 2013-11-10 ENCOUNTER — Encounter: Payer: Self-pay | Admitting: Adult Health

## 2013-11-15 ENCOUNTER — Encounter: Payer: Self-pay | Admitting: Adult Health

## 2013-11-15 ENCOUNTER — Other Ambulatory Visit: Payer: BC Managed Care – PPO

## 2013-11-15 ENCOUNTER — Ambulatory Visit (HOSPITAL_BASED_OUTPATIENT_CLINIC_OR_DEPARTMENT_OTHER): Payer: BC Managed Care – PPO

## 2013-11-15 ENCOUNTER — Ambulatory Visit (HOSPITAL_BASED_OUTPATIENT_CLINIC_OR_DEPARTMENT_OTHER): Payer: BC Managed Care – PPO | Admitting: Adult Health

## 2013-11-15 ENCOUNTER — Other Ambulatory Visit (HOSPITAL_BASED_OUTPATIENT_CLINIC_OR_DEPARTMENT_OTHER): Payer: BC Managed Care – PPO

## 2013-11-15 VITALS — BP 138/90 | HR 87 | Temp 98.8°F | Resp 20 | Ht 66.0 in | Wt 197.9 lb

## 2013-11-15 DIAGNOSIS — Z5112 Encounter for antineoplastic immunotherapy: Secondary | ICD-10-CM

## 2013-11-15 DIAGNOSIS — F419 Anxiety disorder, unspecified: Secondary | ICD-10-CM

## 2013-11-15 DIAGNOSIS — C50812 Malignant neoplasm of overlapping sites of left female breast: Secondary | ICD-10-CM

## 2013-11-15 DIAGNOSIS — Z170 Estrogen receptor positive status [ER+]: Secondary | ICD-10-CM

## 2013-11-15 DIAGNOSIS — C50412 Malignant neoplasm of upper-outer quadrant of left female breast: Secondary | ICD-10-CM

## 2013-11-15 DIAGNOSIS — Z5111 Encounter for antineoplastic chemotherapy: Secondary | ICD-10-CM

## 2013-11-15 LAB — CBC WITH DIFFERENTIAL/PLATELET
BASO%: 0.2 % (ref 0.0–2.0)
Basophils Absolute: 0 10*3/uL (ref 0.0–0.1)
EOS%: 0.2 % (ref 0.0–7.0)
Eosinophils Absolute: 0 10*3/uL (ref 0.0–0.5)
HCT: 31.3 % — ABNORMAL LOW (ref 34.8–46.6)
HGB: 10.2 g/dL — ABNORMAL LOW (ref 11.6–15.9)
LYMPH%: 34.7 % (ref 14.0–49.7)
MCH: 26.9 pg (ref 25.1–34.0)
MCHC: 32.6 g/dL (ref 31.5–36.0)
MCV: 82.6 fL (ref 79.5–101.0)
MONO#: 0.3 10*3/uL (ref 0.1–0.9)
MONO%: 5.8 % (ref 0.0–14.0)
NEUT#: 2.6 10*3/uL (ref 1.5–6.5)
NEUT%: 59.1 % (ref 38.4–76.8)
Platelets: 280 10*3/uL (ref 145–400)
RBC: 3.79 10*6/uL (ref 3.70–5.45)
RDW: 16.2 % — ABNORMAL HIGH (ref 11.2–14.5)
WBC: 4.3 10*3/uL (ref 3.9–10.3)
lymph#: 1.5 10*3/uL (ref 0.9–3.3)
nRBC: 0 % (ref 0–0)

## 2013-11-15 LAB — COMPREHENSIVE METABOLIC PANEL (CC13)
ALT: 36 U/L (ref 0–55)
AST: 21 U/L (ref 5–34)
Albumin: 3.5 g/dL (ref 3.5–5.0)
Alkaline Phosphatase: 52 U/L (ref 40–150)
Anion Gap: 8 mEq/L (ref 3–11)
BUN: 13.3 mg/dL (ref 7.0–26.0)
CALCIUM: 9.4 mg/dL (ref 8.4–10.4)
CO2: 24 mEq/L (ref 22–29)
CREATININE: 0.9 mg/dL (ref 0.6–1.1)
Chloride: 109 mEq/L (ref 98–109)
Glucose: 120 mg/dl (ref 70–140)
Potassium: 4.1 mEq/L (ref 3.5–5.1)
Sodium: 141 mEq/L (ref 136–145)
Total Bilirubin: 0.24 mg/dL (ref 0.20–1.20)
Total Protein: 7 g/dL (ref 6.4–8.3)

## 2013-11-15 MED ORDER — FAMOTIDINE IN NACL 20-0.9 MG/50ML-% IV SOLN
20.0000 mg | Freq: Once | INTRAVENOUS | Status: AC
  Administered 2013-11-15: 20 mg via INTRAVENOUS

## 2013-11-15 MED ORDER — DEXAMETHASONE SODIUM PHOSPHATE 20 MG/5ML IJ SOLN
20.0000 mg | Freq: Once | INTRAMUSCULAR | Status: AC
  Administered 2013-11-15: 20 mg via INTRAVENOUS

## 2013-11-15 MED ORDER — DEXAMETHASONE SODIUM PHOSPHATE 20 MG/5ML IJ SOLN
INTRAMUSCULAR | Status: AC
  Filled 2013-11-15: qty 5

## 2013-11-15 MED ORDER — SODIUM CHLORIDE 0.9 % IV SOLN
Freq: Once | INTRAVENOUS | Status: AC
  Administered 2013-11-15: 13:00:00 via INTRAVENOUS

## 2013-11-15 MED ORDER — ACETAMINOPHEN 325 MG PO TABS
650.0000 mg | ORAL_TABLET | Freq: Once | ORAL | Status: AC
  Administered 2013-11-15: 650 mg via ORAL

## 2013-11-15 MED ORDER — PACLITAXEL CHEMO INJECTION 300 MG/50ML
80.0000 mg/m2 | Freq: Once | INTRAVENOUS | Status: AC
  Administered 2013-11-15: 162 mg via INTRAVENOUS
  Filled 2013-11-15: qty 27

## 2013-11-15 MED ORDER — DIPHENHYDRAMINE HCL 50 MG/ML IJ SOLN
50.0000 mg | Freq: Once | INTRAMUSCULAR | Status: AC
  Administered 2013-11-15: 50 mg via INTRAVENOUS

## 2013-11-15 MED ORDER — HEPARIN SOD (PORK) LOCK FLUSH 100 UNIT/ML IV SOLN
500.0000 [IU] | Freq: Once | INTRAVENOUS | Status: AC | PRN
  Administered 2013-11-15: 500 [IU]
  Filled 2013-11-15: qty 5

## 2013-11-15 MED ORDER — VENLAFAXINE HCL ER 37.5 MG PO CP24: 37.5000 mg | ORAL_CAPSULE | Freq: Every day | ORAL | Status: DC

## 2013-11-15 MED ORDER — ONDANSETRON 8 MG/50ML IVPB (CHCC)
8.0000 mg | Freq: Once | INTRAVENOUS | Status: AC
  Administered 2013-11-15: 8 mg via INTRAVENOUS

## 2013-11-15 MED ORDER — TRASTUZUMAB CHEMO INJECTION 440 MG
2.0000 mg/kg | Freq: Once | INTRAVENOUS | Status: AC
  Administered 2013-11-15: 189 mg via INTRAVENOUS
  Filled 2013-11-15: qty 9

## 2013-11-15 MED ORDER — SODIUM CHLORIDE 0.9 % IJ SOLN
10.0000 mL | INTRAMUSCULAR | Status: DC | PRN
  Administered 2013-11-15: 10 mL
  Filled 2013-11-15: qty 10

## 2013-11-15 NOTE — Patient Instructions (Signed)
[facility] Discharge Instructions for Patients Receiving Chemotherapy  Today you received the following chemotherapy agents  Taxol and Herceptin  To help prevent nausea and vomiting after your treatment, we encourage you to take your nausea medication Zofran as directed   If you develop nausea and vomiting that is not controlled by your nausea medication, call the clinic.   BELOW ARE SYMPTOMS THAT SHOULD BE REPORTED IMMEDIATELY:  *FEVER GREATER THAN 100.5 F  *CHILLS WITH OR WITHOUT FEVER  NAUSEA AND VOMITING THAT IS NOT CONTROLLED WITH YOUR NAUSEA MEDICATION  *UNUSUAL SHORTNESS OF BREATH  *UNUSUAL BRUISING OR BLEEDING  TENDERNESS IN MOUTH AND THROAT WITH OR WITHOUT PRESENCE OF ULCERS  *URINARY PROBLEMS  *BOWEL PROBLEMS  UNUSUAL RASH Items with * indicate a potential emergency and should be followed up as soon as possible.  Feel free to call the clinic you have any questions or concerns. The clinic phone number is [phone].

## 2013-11-15 NOTE — Progress Notes (Signed)
Patient Care Team: Jean Pound, MD as PCP - General (Family Medicine) [name], MD as Consulting Physician (General Surgery) Thea Silversmith, MD as Consulting Physician (Radiation Oncology) Jean Pound, MD as Consulting Physician (Family Medicine) Rulon Eisenmenger, MD as Consulting Physician (Hematology and Oncology)  DIAGNOSIS: Breast cancer of upper-outer quadrant of left female breast   Primary site: Breast (Left)   Staging method: AJCC 7th Edition   Clinical: Stage IIA (T2, N0, cM0) signed by Rulon Eisenmenger, MD on 09/20/2013 10:12 AM   Pathologic: (T1c, N0, cM0)   Summary: Stage IIA (T1c, N0, cM0)   Clinical comments: Staged at breast conference 08/17/13.   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  The cancer was grade 2 and ER positive at 100%, PR+ at 91% and HER2- with a Ki67 of 47%   08/12/2013 Breast MRI 2.1 x 1.2 x 1.2 cm biopsy-proven invasive ductal carcinoma and ductal carcinoma in situ in the 9 o'clock position of the left breast   08/30/2013 Surgery Left Lumpectomy IDC with infiltrating lobular features. Pos for LVSI; 1 mm from nearest margin, DCIS 1 SLN Neg; ONCOTYPE Dx Rec score 17, 10 yr risk of recurrance is 11% with Tamoxifen alone   10/04/2013 -  Chemotherapy Weekly Taxol/Herceptin x 12    CHIEF COMPLIANT: Patient is here for week 7 of Taxol Herceptin  INTERVAL HISTORY: Mrs. Heaslip is a 53 year old American lady with above-mentioned history of HER-2 positive breast cancer. Dellene continues to receive Taxol and Herceptin and appears to be tolerating it well.  She does have some anxiety and would like Effexor to take daily for this.  She denies fevers,  chills, nausea, vomiting, constipation, diarrhea, numbness/tingling, nail changes, or any other concerns.    REVIEW OF SYSTEMS:   A 10 point review of systems was conducted and is otherwise negative except for what is noted above.     Past Medical History  Diagnosis Date  . GERD (gastroesophageal reflux disease)     no current med.  . Dental crown present   . Arthritis     back  . Breast cancer 08/2013    left   Past Surgical History  Procedure Laterality Date  . Foot surgery Right   . Abdominal hysterectomy  12/16/2007  . Lumpectory left  August 30, 2013  . Portacath placement Right 09/26/2013    Procedure: INSERTION PORT-A-CATH;  Surgeon: Shann Medal, MD;  Location: [facility];  Service: General;  Laterality: Right;   Family History  Problem Relation Age of Onset  . Breast cancer Paternal Aunt   . Cervical cancer Paternal Aunt   . Colon cancer Paternal Uncle   . Prostate cancer Paternal Uncle      ALLERGIES:  is allergic to hydrocodone.  MEDICATIONS:  Current Outpatient Prescriptions  Medication Sig Dispense Refill  . cholecalciferol (VITAMIN D) 1000 UNITS tablet Take 1,000 Units by mouth daily.      . Cyanocobalamin (VITAMIN B 12 PO) Take 1 tablet by mouth daily.      Marland Kitchen dexamethasone (DECADRON) 4 MG tablet Take 1 tablet (4 mg total) by mouth 2 (two) times daily with a meal. Day after chemo for 2 days  30 tablet  1  . HYDROcodone-acetaminophen (NORCO/VICODIN) 5-325 MG per tablet       . lidocaine-prilocaine (EMLA) cream Apply 1 application topically as needed.  30 g  3  . lisinopril (PRINIVIL,ZESTRIL) 10 MG tablet Take 1 tablet (10 mg total) by mouth daily.  30 tablet  0  . LORazepam (ATIVAN) 0.5 MG tablet Take 1 tablet (0.5 mg total) by mouth every 6 (six) hours as needed (as needed for nausea and vomiting).  30 tablet  0  . Multiple Vitamin (MULTIVITAMIN) tablet Take 1 tablet by mouth daily.      Marland Kitchen omeprazole (PRILOSEC) 40 MG capsule Take 1 capsule (40 mg total) by mouth  daily.  30 capsule  3  . ondansetron (ZOFRAN) 8 MG tablet Take 1 tablet (8 mg total) by mouth 2 (two) times daily.  30 tablet  1  . oxyCODONE-acetaminophen (PERCOCET) 10-325 MG per tablet Take 1 tablet by mouth every 4 (four) hours as needed for pain.  30 tablet  0  . prochlorperazine (COMPAZINE) 10 MG tablet Take 1 tablet (10 mg total) by mouth every 6 (six) hours as needed for nausea or vomiting.  30 tablet  1  . UNABLE TO FIND Cranial Prothesis      . zolpidem (AMBIEN) 5 MG tablet Take 5 mg by mouth at bedtime as needed for sleep.       No current facility-administered medications for this visit.    PHYSICAL EXAMINATION: ECOG PERFORMANCE STATUS: 1 - Symptomatic but completely ambulatory  Filed Vitals:   11/15/13 1144  BP: 138/90  Pulse: 87  Temp: 98.8 F (37.1 C)  Resp: 20   Filed Weights   11/15/13 1144  Weight: 197 lb 14.4 oz (89.767 kg)   GENERAL: Patient is a well appearing female in no acute distress HEENT:  Sclerae anicteric.  Oropharynx clear and moist. No ulcerations or evidence of oropharyngeal candidiasis. Neck is supple.  NODES:  No cervical, supraclavicular, or axillary lymphadenopathy palpated.  BREAST EXAM:  Deferred. LUNGS:  Clear to auscultation bilaterally.  No wheezes or rhonchi. HEART:  Regular rate and rhythm. No murmur appreciated. ABDOMEN:  Soft, nontender.  Positive, normoactive bowel sounds. No organomegaly palpated. MSK:  No focal spinal tenderness to palpation. Full range of motion bilaterally in the upper extremities. EXTREMITIES:  No peripheral edema.   SKIN:  Clear with no obvious rashes or skin changes. No nail dyscrasia. NEURO:  Nonfocal. Well oriented.  Appropriate affect.    LABORATORY DATA:  I have reviewed the data as listed   Chemistry      Component Value Date/Time   NA 141 11/15/2013 1123   NA 140 12/17/2007 0520   K 4.1 11/15/2013 1123   K 4.4 12/17/2007 0520   CL 105 12/17/2007 0520   CO2 24 11/15/2013 1123   CO2 31  12/17/2007 0520   BUN 13.3 11/15/2013 1123   BUN 5* 12/17/2007 0520   CREATININE 0.9 11/15/2013 1123   CREATININE 0.62 12/17/2007 0520      Component Value Date/Time   CALCIUM 9.4 11/15/2013 1123   CALCIUM 8.7 12/17/2007 0520   ALKPHOS 52 11/15/2013 1123   AST 21 11/15/2013 1123   ALT 36 11/15/2013 1123   BILITOT 0.24 11/15/2013 1123       Lab Results  Component Value Date   WBC 4.3 11/15/2013   HGB 10.2* 11/15/2013   HCT 31.3* 11/15/2013   MCV 82.6 11/15/2013   PLT 280 11/15/2013   NEUTROABS 2.6 11/15/2013  RADIOGRAPHIC STUDIES: I have personally reviewed the radiology reports and agreed with their findings. No results found.   ASSESSMENT:  53 year old [place], [place] woman with   1. 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 Pathology demonstrated heterogeneity in regards to HER-2 expression. Lumpectomy date was on 08/30/13 by Dr. Lucia Gaskins.   2. Patient began weekly Paclitaxel and Trastuzumab therapy on 10/04/13. A total of 12 weeks are planned.   3. Radiation therapy to follow chemotherapy.   4. Anti-estrogen therapy to follow radiation therapy.     PLAN:   Nayda is doing well today.  I reviewed her labs with her in detail.  She does continue to have a mild treatment related anemia that we will monitor.  She will proceed with week 7 of Taxol and Herceptin today.    She does have some anxiety related to the breast cancer diagnosis, and I prescribed Effexor for her to take daily.  I gave her detailed information in her AVS regarding this.    Amariana will return next week for labs, evaluation, and treatment.    The patient has a good understanding of the overall plan. she agrees with it. She will call with any problems that may develop before her next visit here.  I spent 25 minutes counseling the patient face to face. The total time spent in the appointment was 30 minutes and more than 50% was on  counseling and review of test results  Minette Headland, [place] [phone] 11/15/2013 12:16 PM

## 2013-11-22 ENCOUNTER — Encounter: Payer: Self-pay | Admitting: Adult Health

## 2013-11-22 ENCOUNTER — Other Ambulatory Visit (HOSPITAL_BASED_OUTPATIENT_CLINIC_OR_DEPARTMENT_OTHER): Payer: BC Managed Care – PPO

## 2013-11-22 ENCOUNTER — Ambulatory Visit (HOSPITAL_BASED_OUTPATIENT_CLINIC_OR_DEPARTMENT_OTHER): Payer: BC Managed Care – PPO

## 2013-11-22 ENCOUNTER — Ambulatory Visit (HOSPITAL_BASED_OUTPATIENT_CLINIC_OR_DEPARTMENT_OTHER): Payer: BC Managed Care – PPO | Admitting: Adult Health

## 2013-11-22 ENCOUNTER — Other Ambulatory Visit: Payer: BC Managed Care – PPO

## 2013-11-22 VITALS — BP 148/102 | HR 100 | Temp 98.4°F | Resp 18 | Ht 66.0 in | Wt 196.3 lb

## 2013-11-22 DIAGNOSIS — C50412 Malignant neoplasm of upper-outer quadrant of left female breast: Secondary | ICD-10-CM

## 2013-11-22 DIAGNOSIS — Z5112 Encounter for antineoplastic immunotherapy: Secondary | ICD-10-CM

## 2013-11-22 DIAGNOSIS — Z170 Estrogen receptor positive status [ER+]: Secondary | ICD-10-CM

## 2013-11-22 DIAGNOSIS — C50812 Malignant neoplasm of overlapping sites of left female breast: Secondary | ICD-10-CM

## 2013-11-22 DIAGNOSIS — Z5111 Encounter for antineoplastic chemotherapy: Secondary | ICD-10-CM

## 2013-11-22 DIAGNOSIS — I10 Essential (primary) hypertension: Secondary | ICD-10-CM

## 2013-11-22 LAB — COMPREHENSIVE METABOLIC PANEL (CC13)
ALK PHOS: 59 U/L (ref 40–150)
ALT: 28 U/L (ref 0–55)
AST: 16 U/L (ref 5–34)
Albumin: 3.5 g/dL (ref 3.5–5.0)
Anion Gap: 9 mEq/L (ref 3–11)
BILIRUBIN TOTAL: 0.26 mg/dL (ref 0.20–1.20)
BUN: 13.4 mg/dL (ref 7.0–26.0)
CO2: 22 mEq/L (ref 22–29)
CREATININE: 0.9 mg/dL (ref 0.6–1.1)
Calcium: 9.4 mg/dL (ref 8.4–10.4)
Chloride: 108 mEq/L (ref 98–109)
Glucose: 155 mg/dl — ABNORMAL HIGH (ref 70–140)
Potassium: 3.7 mEq/L (ref 3.5–5.1)
Sodium: 139 mEq/L (ref 136–145)
Total Protein: 7 g/dL (ref 6.4–8.3)

## 2013-11-22 LAB — CBC WITH DIFFERENTIAL/PLATELET
BASO%: 0.2 % (ref 0.0–2.0)
BASOS ABS: 0 10*3/uL (ref 0.0–0.1)
EOS%: 0.2 % (ref 0.0–7.0)
Eosinophils Absolute: 0 10*3/uL (ref 0.0–0.5)
HEMATOCRIT: 32.9 % — AB (ref 34.8–46.6)
HEMOGLOBIN: 10.7 g/dL — AB (ref 11.6–15.9)
LYMPH#: 1.4 10*3/uL (ref 0.9–3.3)
LYMPH%: 27.1 % (ref 14.0–49.7)
MCH: 27 pg (ref 25.1–34.0)
MCHC: 32.5 g/dL (ref 31.5–36.0)
MCV: 82.9 fL (ref 79.5–101.0)
MONO#: 0.3 10*3/uL (ref 0.1–0.9)
MONO%: 6.5 % (ref 0.0–14.0)
NEUT#: 3.3 10*3/uL (ref 1.5–6.5)
NEUT%: 66 % (ref 38.4–76.8)
Platelets: 299 10*3/uL (ref 145–400)
RBC: 3.97 10*6/uL (ref 3.70–5.45)
RDW: 16.1 % — ABNORMAL HIGH (ref 11.2–14.5)
WBC: 5.1 10*3/uL (ref 3.9–10.3)

## 2013-11-22 MED ORDER — ONDANSETRON 8 MG/50ML IVPB (CHCC)
8.0000 mg | Freq: Once | INTRAVENOUS | Status: AC
  Administered 2013-11-22: 8 mg via INTRAVENOUS

## 2013-11-22 MED ORDER — DEXAMETHASONE SODIUM PHOSPHATE 20 MG/5ML IJ SOLN
20.0000 mg | Freq: Once | INTRAMUSCULAR | Status: AC
  Administered 2013-11-22: 20 mg via INTRAVENOUS

## 2013-11-22 MED ORDER — LISINOPRIL-HYDROCHLOROTHIAZIDE 10-12.5 MG PO TABS: 1.0000 | ORAL_TABLET | Freq: Every day | ORAL | Status: DC

## 2013-11-22 MED ORDER — DIPHENHYDRAMINE HCL 50 MG/ML IJ SOLN
INTRAMUSCULAR | Status: AC
  Filled 2013-11-22: qty 1

## 2013-11-22 MED ORDER — HEPARIN SOD (PORK) LOCK FLUSH 100 UNIT/ML IV SOLN
500.0000 [IU] | Freq: Once | INTRAVENOUS | Status: AC | PRN
  Administered 2013-11-22: 500 [IU]
  Filled 2013-11-22: qty 5

## 2013-11-22 MED ORDER — FAMOTIDINE IN NACL 20-0.9 MG/50ML-% IV SOLN
INTRAVENOUS | Status: AC
  Filled 2013-11-22: qty 50

## 2013-11-22 MED ORDER — FAMOTIDINE IN NACL 20-0.9 MG/50ML-% IV SOLN
20.0000 mg | Freq: Once | INTRAVENOUS | Status: AC
  Administered 2013-11-22: 20 mg via INTRAVENOUS

## 2013-11-22 MED ORDER — SODIUM CHLORIDE 0.9 % IV SOLN
Freq: Once | INTRAVENOUS | Status: AC
  Administered 2013-11-22: 13:00:00 via INTRAVENOUS

## 2013-11-22 MED ORDER — SODIUM CHLORIDE 0.9 % IV SOLN
2.0000 mg/kg | Freq: Once | INTRAVENOUS | Status: AC
  Administered 2013-11-22: 189 mg via INTRAVENOUS
  Filled 2013-11-22: qty 9

## 2013-11-22 MED ORDER — ONDANSETRON 8 MG/NS 50 ML IVPB
INTRAVENOUS | Status: AC
Start: 2013-11-22 — End: 2013-11-22
  Filled 2013-11-22: qty 8

## 2013-11-22 MED ORDER — ACETAMINOPHEN 325 MG PO TABS
650.0000 mg | ORAL_TABLET | Freq: Once | ORAL | Status: AC
  Administered 2013-11-22: 650 mg via ORAL

## 2013-11-22 MED ORDER — ACETAMINOPHEN 325 MG PO TABS
ORAL_TABLET | ORAL | Status: AC
  Filled 2013-11-22: qty 2

## 2013-11-22 MED ORDER — DEXAMETHASONE SODIUM PHOSPHATE 20 MG/5ML IJ SOLN
INTRAMUSCULAR | Status: AC
  Filled 2013-11-22: qty 5

## 2013-11-22 MED ORDER — SODIUM CHLORIDE 0.9 % IJ SOLN
10.0000 mL | INTRAMUSCULAR | Status: DC | PRN
  Administered 2013-11-22: 10 mL
  Filled 2013-11-22: qty 10

## 2013-11-22 MED ORDER — DIPHENHYDRAMINE HCL 50 MG/ML IJ SOLN
50.0000 mg | Freq: Once | INTRAMUSCULAR | Status: AC
  Administered 2013-11-22: 50 mg via INTRAVENOUS

## 2013-11-22 MED ORDER — PACLITAXEL CHEMO INJECTION 300 MG/50ML
80.0000 mg/m2 | Freq: Once | INTRAVENOUS | Status: AC
  Administered 2013-11-22: 162 mg via INTRAVENOUS
  Filled 2013-11-22: qty 27

## 2013-11-22 NOTE — Progress Notes (Signed)
Patient Care Team: Gavin Pound, MD as PCP - General (Family Medicine) [name], MD as Consulting Physician (General Surgery) Thea Silversmith, MD as Consulting Physician (Radiation Oncology) Gavin Pound, MD as Consulting Physician (Family Medicine) Rulon Eisenmenger, MD as Consulting Physician (Hematology and Oncology)  DIAGNOSIS: Breast cancer of upper-outer quadrant of left female breast   Primary site: Breast (Left)   Staging method: AJCC 7th Edition   Clinical: Stage IIA (T2, N0, cM0) signed by Rulon Eisenmenger, MD on 09/20/2013 10:12 AM   Pathologic: (T1c, N0, cM0)   Summary: Stage IIA (T1c, N0, cM0)   Clinical comments: Staged at breast conference 08/17/13.   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  The cancer was grade 2 and ER positive at 100%, PR+ at 91% and HER2- with a Ki67 of 47%   08/12/2013 Breast MRI 2.1 x 1.2 x 1.2 cm biopsy-proven invasive ductal carcinoma and ductal carcinoma in situ in the 9 o'clock position of the left breast   08/30/2013 Surgery Left Lumpectomy IDC with infiltrating lobular features. Pos for LVSI; 1 mm from nearest margin, DCIS 1 SLN Neg; ONCOTYPE Dx Rec score 17, 10 yr risk of recurrance is 11% with Tamoxifen alone   10/04/2013 -  Chemotherapy Weekly Taxol/Herceptin x 12    CHIEF COMPLIANT: Patient is here for week 8 of Taxol Herceptin  INTERVAL HISTORY: Jean [name] is a 53 year old American lady with above-mentioned history of HER-2 positive breast cancer. Jean [name] continues to receive Taxol and Herceptin and appears to be tolerating it well.  She is feeling well today.  She does have the issue of hypertension, and our nurse has called  her primary care doctors office and hasn't really gotten any request to see the patient or contact the patient.  She did have a couple of days of headaches but those are now resolved.  Otherwise, a 10 point ROS is neg.   REVIEW OF SYSTEMS:   A 10 point review of systems was conducted and is otherwise negative except for what is noted above.     Past Medical History  Diagnosis Date  . GERD (gastroesophageal reflux disease)     no current med.  . Dental crown present   . Arthritis     back  . Breast cancer 08/2013    left   Past Surgical History  Procedure Laterality Date  . Foot surgery Right   . Abdominal hysterectomy  12/16/2007  . Lumpectory left  August 30, 2013  . Portacath placement Right 09/26/2013    Procedure: INSERTION PORT-A-CATH;  Surgeon: Shann Medal, MD;  Location: [facility];  Service: General;  Laterality: Right;   Family History  Problem Relation Age of Onset  . Breast cancer Paternal Aunt   . Cervical cancer Paternal Aunt   . Colon cancer Paternal Uncle   . Prostate cancer Paternal Uncle      ALLERGIES:  is allergic to hydrocodone.  MEDICATIONS:  Current Outpatient Prescriptions  Medication Sig Dispense Refill  . cholecalciferol (VITAMIN D) 1000 UNITS tablet Take 1,000 Units by mouth daily.      . Cyanocobalamin (VITAMIN B 12 PO) Take 1 tablet by mouth daily.      Marland Kitchen  dexamethasone (DECADRON) 4 MG tablet Take 1 tablet (4 mg total) by mouth 2 (two) times daily with a meal. Day after chemo for 2 days  30 tablet  1  . HYDROcodone-acetaminophen (NORCO/VICODIN) 5-325 MG per tablet       . lidocaine-prilocaine (EMLA) cream Apply 1 application topically as needed.  30 g  3  . lisinopril (PRINIVIL,ZESTRIL) 10 MG tablet Take 1 tablet (10 mg total) by mouth daily.  30 tablet  0  . LORazepam (ATIVAN) 0.5 MG tablet Take 1 tablet (0.5 mg total) by mouth every 6 (six) hours as needed (as needed for nausea and vomiting).  30 tablet  0  . Multiple Vitamin (MULTIVITAMIN) tablet  Take 1 tablet by mouth daily.      Marland Kitchen omeprazole (PRILOSEC) 40 MG capsule Take 1 capsule (40 mg total) by mouth daily.  30 capsule  3  . ondansetron (ZOFRAN) 8 MG tablet Take 1 tablet (8 mg total) by mouth 2 (two) times daily.  30 tablet  1  . oxyCODONE-acetaminophen (PERCOCET) 10-325 MG per tablet Take 1 tablet by mouth every 4 (four) hours as needed for pain.  30 tablet  0  . prochlorperazine (COMPAZINE) 10 MG tablet Take 1 tablet (10 mg total) by mouth every 6 (six) hours as needed for nausea or vomiting.  30 tablet  1  . UNABLE TO FIND Cranial Prothesis      . venlafaxine XR (EFFEXOR-XR) 37.5 MG 24 hr capsule Take 1 capsule (37.5 mg total) by mouth daily with breakfast.  30 capsule  0  . zolpidem (AMBIEN) 5 MG tablet Take 5 mg by mouth at bedtime as needed for sleep.       No current facility-administered medications for this visit.    PHYSICAL EXAMINATION: ECOG PERFORMANCE STATUS: 1 - Symptomatic but completely ambulatory  Filed Vitals:   11/22/13 1146  BP: 133/100  Pulse: 100  Temp: 98.4 F (36.9 C)  Resp: 18   Filed Weights   11/22/13 1146  Weight: 196 lb 4.8 oz (89.041 kg)   GENERAL: Patient is a well appearing female in no acute distress HEENT:  Sclerae anicteric.  Oropharynx clear and moist. No ulcerations or evidence of oropharyngeal candidiasis. Neck is supple.  NODES:  No cervical, supraclavicular, or axillary lymphadenopathy palpated.  BREAST EXAM:  Deferred. LUNGS:  Clear to auscultation bilaterally.  No wheezes or rhonchi. HEART:  Regular rate and rhythm. No murmur appreciated. ABDOMEN:  Soft, nontender.  Positive, normoactive bowel sounds. No organomegaly palpated. MSK:  No focal spinal tenderness to palpation. Full range of motion bilaterally in the upper extremities. EXTREMITIES:  No peripheral edema.   SKIN:  Clear with no obvious rashes or skin changes. No nail dyscrasia. NEURO:  Nonfocal. Well oriented.  Appropriate affect.    LABORATORY DATA:  I have  reviewed the data as listed   Chemistry      Component Value Date/Time   NA 139 11/22/2013 1122   NA 140 12/17/2007 0520   K 3.7 11/22/2013 1122   K 4.4 12/17/2007 0520   CL 105 12/17/2007 0520   CO2 22 11/22/2013 1122   CO2 31 12/17/2007 0520   BUN 13.4 11/22/2013 1122   BUN 5* 12/17/2007 0520   CREATININE 0.9 11/22/2013 1122   CREATININE 0.62 12/17/2007 0520      Component Value Date/Time   CALCIUM 9.4 11/22/2013 1122   CALCIUM 8.7 12/17/2007 0520   ALKPHOS 59 11/22/2013 1122   AST 16 11/22/2013 1122  ALT 28 11/22/2013 1122   BILITOT 0.26 11/22/2013 1122       Lab Results  Component Value Date   WBC 5.1 11/22/2013   HGB 10.7* 11/22/2013   HCT 32.9* 11/22/2013   MCV 82.9 11/22/2013   PLT 299 11/22/2013   NEUTROABS 3.3 11/22/2013     RADIOGRAPHIC STUDIES: I have personally reviewed the radiology reports and agreed with their findings. No results found.   ASSESSMENT:  53 year old [place], [place] woman with   1. 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 Pathology demonstrated heterogeneity in regards to HER-2 expression. Lumpectomy date was on 08/30/13 by Dr. Lucia Gaskins.   2. Patient began weekly Paclitaxel and Trastuzumab therapy on 10/04/13. A total of 12 weeks are planned.   3. Radiation therapy to follow chemotherapy.   4. Anti-estrogen therapy to follow radiation therapy.     PLAN:  Jean [name] is doing well today.  She will proceed with week 8 of Taxol and Herceptin.  Her labs are stable, I reviewed them with her in detail.  I prescribed Lisinopril-HCT 10-12.5 mg for her to take daily for her hypertension and discontinued her Lisinopril.  She verbalized understanding of this.    I recommended she keep the effexor prescription on hand in case she decides that she needs it for her anxiety.    Jean [name] will return next week for labs, evaluation, and treatment.    The patient has a good understanding  of the overall plan. she agrees with it. She will call with any problems that may develop before her next visit here.  I spent 25 minutes counseling the patient face to face. The total time spent in the appointment was 30 minutes and more than 50% was on counseling and review of test results  Minette Headland, [place] [phone] 11/22/2013 12:14 PM

## 2013-11-22 NOTE — Patient Instructions (Signed)
[facility] Discharge Instructions for Patients Receiving Chemotherapy  Today you received the following chemotherapy agents Taxol and Herceptin.  To help prevent nausea and vomiting after your treatment, we encourage you to take your nausea medication.   If you develop nausea and vomiting that is not controlled by your nausea medication, call the clinic.   BELOW ARE SYMPTOMS THAT SHOULD BE REPORTED IMMEDIATELY:  *FEVER GREATER THAN 100.5 F  *CHILLS WITH OR WITHOUT FEVER  NAUSEA AND VOMITING THAT IS NOT CONTROLLED WITH YOUR NAUSEA MEDICATION  *UNUSUAL SHORTNESS OF BREATH  *UNUSUAL BRUISING OR BLEEDING  TENDERNESS IN MOUTH AND THROAT WITH OR WITHOUT PRESENCE OF ULCERS  *URINARY PROBLEMS  *BOWEL PROBLEMS  UNUSUAL RASH Items with * indicate a potential emergency and should be followed up as soon as possible.  Feel free to call the clinic you have any questions or concerns. The clinic phone number is [phone].

## 2013-11-25 ENCOUNTER — Encounter: Payer: Self-pay | Admitting: *Deleted

## 2013-11-25 NOTE — Progress Notes (Signed)
Called and faxed pt's elevated BP's to Dr. Orland Penman. Communicated with her nurse that this concern needs to be addressed with an f/u appt. I did mention pt did state she is taking Lisinopril as prescribed. Message to be forwarded to Charlestine Massed, NP.

## 2013-11-29 ENCOUNTER — Other Ambulatory Visit: Payer: BC Managed Care – PPO

## 2013-11-29 ENCOUNTER — Ambulatory Visit (HOSPITAL_BASED_OUTPATIENT_CLINIC_OR_DEPARTMENT_OTHER): Payer: BC Managed Care – PPO

## 2013-11-29 ENCOUNTER — Ambulatory Visit (HOSPITAL_BASED_OUTPATIENT_CLINIC_OR_DEPARTMENT_OTHER): Payer: BC Managed Care – PPO | Admitting: Adult Health

## 2013-11-29 ENCOUNTER — Encounter: Payer: Self-pay | Admitting: Adult Health

## 2013-11-29 ENCOUNTER — Other Ambulatory Visit (HOSPITAL_BASED_OUTPATIENT_CLINIC_OR_DEPARTMENT_OTHER): Payer: BC Managed Care – PPO

## 2013-11-29 VITALS — BP 126/83 | HR 123 | Temp 99.4°F | Resp 19 | Ht 66.0 in | Wt 196.7 lb

## 2013-11-29 DIAGNOSIS — G629 Polyneuropathy, unspecified: Secondary | ICD-10-CM

## 2013-11-29 DIAGNOSIS — Z5111 Encounter for antineoplastic chemotherapy: Secondary | ICD-10-CM

## 2013-11-29 DIAGNOSIS — D6481 Anemia due to antineoplastic chemotherapy: Secondary | ICD-10-CM

## 2013-11-29 DIAGNOSIS — C50412 Malignant neoplasm of upper-outer quadrant of left female breast: Secondary | ICD-10-CM

## 2013-11-29 DIAGNOSIS — C50812 Malignant neoplasm of overlapping sites of left female breast: Secondary | ICD-10-CM

## 2013-11-29 DIAGNOSIS — Z5112 Encounter for antineoplastic immunotherapy: Secondary | ICD-10-CM

## 2013-11-29 DIAGNOSIS — Z170 Estrogen receptor positive status [ER+]: Secondary | ICD-10-CM

## 2013-11-29 DIAGNOSIS — I10 Essential (primary) hypertension: Secondary | ICD-10-CM

## 2013-11-29 LAB — CBC WITH DIFFERENTIAL/PLATELET
BASO%: 0.9 % (ref 0.0–2.0)
Basophils Absolute: 0.1 10*3/uL (ref 0.0–0.1)
EOS ABS: 0 10*3/uL (ref 0.0–0.5)
EOS%: 0.2 % (ref 0.0–7.0)
HCT: 33 % — ABNORMAL LOW (ref 34.8–46.6)
HGB: 10.8 g/dL — ABNORMAL LOW (ref 11.6–15.9)
LYMPH%: 20.2 % (ref 14.0–49.7)
MCH: 26.8 pg (ref 25.1–34.0)
MCHC: 32.7 g/dL (ref 31.5–36.0)
MCV: 82 fL (ref 79.5–101.0)
MONO#: 0.5 10*3/uL (ref 0.1–0.9)
MONO%: 5.3 % (ref 0.0–14.0)
NEUT#: 6.6 10*3/uL — ABNORMAL HIGH (ref 1.5–6.5)
NEUT%: 73.4 % (ref 38.4–76.8)
PLATELETS: 375 10*3/uL (ref 145–400)
RBC: 4.02 10*6/uL (ref 3.70–5.45)
RDW: 16.2 % — ABNORMAL HIGH (ref 11.2–14.5)
WBC: 9 10*3/uL (ref 3.9–10.3)
lymph#: 1.8 10*3/uL (ref 0.9–3.3)

## 2013-11-29 LAB — COMPREHENSIVE METABOLIC PANEL (CC13)
ALK PHOS: 63 U/L (ref 40–150)
ALT: 32 U/L (ref 0–55)
AST: 18 U/L (ref 5–34)
Albumin: 3.6 g/dL (ref 3.5–5.0)
Anion Gap: 12 mEq/L — ABNORMAL HIGH (ref 3–11)
BUN: 16.3 mg/dL (ref 7.0–26.0)
CHLORIDE: 102 meq/L (ref 98–109)
CO2: 24 mEq/L (ref 22–29)
Calcium: 10 mg/dL (ref 8.4–10.4)
Creatinine: 0.9 mg/dL (ref 0.6–1.1)
Glucose: 155 mg/dl — ABNORMAL HIGH (ref 70–140)
POTASSIUM: 3.5 meq/L (ref 3.5–5.1)
Sodium: 138 mEq/L (ref 136–145)
TOTAL PROTEIN: 7.4 g/dL (ref 6.4–8.3)
Total Bilirubin: 0.35 mg/dL (ref 0.20–1.20)

## 2013-11-29 LAB — TECHNOLOGIST REVIEW

## 2013-11-29 MED ORDER — DIPHENHYDRAMINE HCL 50 MG/ML IJ SOLN
INTRAMUSCULAR | Status: AC
  Filled 2013-11-29: qty 1

## 2013-11-29 MED ORDER — HEPARIN SOD (PORK) LOCK FLUSH 100 UNIT/ML IV SOLN
500.0000 [IU] | Freq: Once | INTRAVENOUS | Status: AC | PRN
  Administered 2013-11-29: 500 [IU]
  Filled 2013-11-29: qty 5

## 2013-11-29 MED ORDER — TRASTUZUMAB CHEMO INJECTION 440 MG
2.0000 mg/kg | Freq: Once | INTRAVENOUS | Status: AC
  Administered 2013-11-29: 189 mg via INTRAVENOUS
  Filled 2013-11-29: qty 9

## 2013-11-29 MED ORDER — ONDANSETRON 8 MG/50ML IVPB (CHCC)
8.0000 mg | Freq: Once | INTRAVENOUS | Status: AC
  Administered 2013-11-29: 8 mg via INTRAVENOUS

## 2013-11-29 MED ORDER — GABAPENTIN 300 MG PO CAPS: 300.0000 mg | ORAL_CAPSULE | Freq: Every day | ORAL | Status: DC

## 2013-11-29 MED ORDER — PACLITAXEL CHEMO INJECTION 300 MG/50ML
80.0000 mg/m2 | Freq: Once | INTRAVENOUS | Status: AC
  Administered 2013-11-29: 162 mg via INTRAVENOUS
  Filled 2013-11-29: qty 27

## 2013-11-29 MED ORDER — SODIUM CHLORIDE 0.9 % IV SOLN
Freq: Once | INTRAVENOUS | Status: AC
  Administered 2013-11-29: 12:00:00 via INTRAVENOUS

## 2013-11-29 MED ORDER — SODIUM CHLORIDE 0.9 % IJ SOLN
10.0000 mL | INTRAMUSCULAR | Status: DC | PRN
  Administered 2013-11-29: 10 mL
  Filled 2013-11-29: qty 10

## 2013-11-29 MED ORDER — FAMOTIDINE IN NACL 20-0.9 MG/50ML-% IV SOLN
INTRAVENOUS | Status: AC
  Filled 2013-11-29: qty 50

## 2013-11-29 MED ORDER — FAMOTIDINE IN NACL 20-0.9 MG/50ML-% IV SOLN
20.0000 mg | Freq: Once | INTRAVENOUS | Status: AC
  Administered 2013-11-29: 20 mg via INTRAVENOUS

## 2013-11-29 MED ORDER — DIPHENHYDRAMINE HCL 50 MG/ML IJ SOLN
50.0000 mg | Freq: Once | INTRAMUSCULAR | Status: AC
  Administered 2013-11-29: 50 mg via INTRAVENOUS

## 2013-11-29 MED ORDER — ACETAMINOPHEN 325 MG PO TABS
ORAL_TABLET | ORAL | Status: AC
  Filled 2013-11-29: qty 2

## 2013-11-29 MED ORDER — ONDANSETRON 8 MG/NS 50 ML IVPB
INTRAVENOUS | Status: AC
  Filled 2013-11-29: qty 8

## 2013-11-29 MED ORDER — ACETAMINOPHEN 325 MG PO TABS
650.0000 mg | ORAL_TABLET | Freq: Once | ORAL | Status: AC
  Administered 2013-11-29: 650 mg via ORAL

## 2013-11-29 MED ORDER — DEXAMETHASONE SODIUM PHOSPHATE 20 MG/5ML IJ SOLN
20.0000 mg | Freq: Once | INTRAMUSCULAR | Status: AC
  Administered 2013-11-29: 20 mg via INTRAVENOUS

## 2013-11-29 NOTE — Progress Notes (Signed)
Patient Care Team: Gavin Pound, MD as PCP - General (Family Medicine) [name], MD as Consulting Physician (General Surgery) Thea Silversmith, MD as Consulting Physician (Radiation Oncology) Gavin Pound, MD as Consulting Physician (Family Medicine) Rulon Eisenmenger, MD as Consulting Physician (Hematology and Oncology)  DIAGNOSIS: Breast cancer of upper-outer quadrant of left female breast   Primary site: Breast (Left)   Staging method: AJCC 7th Edition   Clinical: Stage IIA (T2, N0, cM0) signed by Rulon Eisenmenger, MD on 09/20/2013 10:12 AM   Pathologic: (T1c, N0, cM0)   Summary: Stage IIA (T1c, N0, cM0)   Clinical comments: Staged at breast conference 08/17/13.   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  The cancer was grade 2 and ER positive at 100%, PR+ at 91% and HER2- with a Ki67 of 47%   08/12/2013 Breast MRI 2.1 x 1.2 x 1.2 cm biopsy-proven invasive ductal carcinoma and ductal carcinoma in situ in the 9 o'clock position of the left breast   08/30/2013 Surgery Left Lumpectomy IDC with infiltrating lobular features. Pos for LVSI; 1 mm from nearest margin, DCIS 1 SLN Neg; ONCOTYPE Dx Rec score 17, 10 yr risk of recurrance is 11% with Tamoxifen alone   10/04/2013 -  Chemotherapy Weekly Taxol/Herceptin x 12    CHIEF COMPLIANT: Patient is here for week 9 of Taxol Herceptin  INTERVAL HISTORY: Mrs. Cupo is a 53 year old American lady with above-mentioned history of HER-2 positive breast cancer. She is doing well today. She does have mild cramping and numbness in her feet that occasionally wakes her up.  She experiences no numbness during the day.  She is requesting  something to help with this.  She denies fevers, chills, nausea, vomiting, constipation, diarrhea, or any further concerns.  She is taking her new BP medication, Lisinopril/HCTZ and is tolerating it well.    REVIEW OF SYSTEMS:   A 10 point review of systems was conducted and is otherwise negative except for what is noted above.     Past Medical History  Diagnosis Date  . GERD (gastroesophageal reflux disease)     no current med.  . Dental crown present   . Arthritis     back  . Breast cancer 08/2013    left   Past Surgical History  Procedure Laterality Date  . Foot surgery Right   . Abdominal hysterectomy  12/16/2007  . Lumpectory left  August 30, 2013  . Portacath placement Right 09/26/2013    Procedure: INSERTION PORT-A-CATH;  Surgeon: Shann Medal, MD;  Location: [facility];  Service: General;  Laterality: Right;   Family History  Problem Relation Age of Onset  . Breast cancer Paternal Aunt   . Cervical cancer Paternal Aunt   . Colon cancer Paternal Uncle   . Prostate cancer Paternal Uncle      ALLERGIES:  is allergic to hydrocodone.  MEDICATIONS:  Current Outpatient Prescriptions  Medication Sig Dispense Refill  . cholecalciferol (VITAMIN D) 1000 UNITS tablet Take 1,000 Units by mouth daily.      . Cyanocobalamin (VITAMIN B 12 PO) Take 1 tablet by mouth daily.      Marland Kitchen dexamethasone (DECADRON) 4 MG tablet Take 1 tablet (  4 mg total) by mouth 2 (two) times daily with a meal. Day after chemo for 2 days  30 tablet  1  . lidocaine-prilocaine (EMLA) cream Apply 1 application topically as needed.  30 g  3  . lisinopril-hydrochlorothiazide (PRINZIDE,ZESTORETIC) 10-12.5 MG per tablet Take 1 tablet by mouth daily. Pt takes a lisinopril-HCTZ 12.5-12.5 mg per tablet daily      . Multiple Vitamin (MULTIVITAMIN) tablet Take 1 tablet by mouth daily.      Marland Kitchen omeprazole (PRILOSEC) 40 MG capsule Take 1 capsule (40 mg total) by mouth daily.  30 capsule  3  . ondansetron (ZOFRAN) 8 MG tablet Take  1 tablet (8 mg total) by mouth 2 (two) times daily.  30 tablet  1  . prochlorperazine (COMPAZINE) 10 MG tablet Take 1 tablet (10 mg total) by mouth every 6 (six) hours as needed for nausea or vomiting.  30 tablet  1  . UNABLE TO FIND Cranial Prothesis      . venlafaxine XR (EFFEXOR-XR) 37.5 MG 24 hr capsule Take 1 capsule (37.5 mg total) by mouth daily with breakfast.  30 capsule  0  . HYDROcodone-acetaminophen (NORCO/VICODIN) 5-325 MG per tablet       . LORazepam (ATIVAN) 0.5 MG tablet Take 1 tablet (0.5 mg total) by mouth every 6 (six) hours as needed (as needed for nausea and vomiting).  30 tablet  0  . oxyCODONE-acetaminophen (PERCOCET) 10-325 MG per tablet Take 1 tablet by mouth every 4 (four) hours as needed for pain.  30 tablet  0  . zolpidem (AMBIEN) 5 MG tablet Take 5 mg by mouth at bedtime as needed for sleep.       No current facility-administered medications for this visit.    PHYSICAL EXAMINATION: ECOG PERFORMANCE STATUS: 1 - Symptomatic but completely ambulatory  Filed Vitals:   11/29/13 1019  BP: 126/83  Pulse: 123  Temp: 99.4 F (37.4 C)  Resp: 19   Filed Weights   11/29/13 1019  Weight: 196 lb 11.2 oz (89.223 kg)   GENERAL: Patient is a well appearing female in no acute distress HEENT:  Sclerae anicteric.  Oropharynx clear and moist. No ulcerations or evidence of oropharyngeal candidiasis. Neck is supple.  NODES:  No cervical, supraclavicular, or axillary lymphadenopathy palpated.  BREAST EXAM:  Deferred. LUNGS:  Clear to auscultation bilaterally.  No wheezes or rhonchi. HEART:  Regular rate and rhythm. HR rechecked at 100.  No murmur appreciated. ABDOMEN:  Soft, nontender.  Positive, normoactive bowel sounds. No organomegaly palpated. MSK:  No focal spinal tenderness to palpation. Full range of motion bilaterally in the upper extremities. EXTREMITIES:  No peripheral edema.   SKIN:  Clear with no obvious rashes or skin changes. No nail dyscrasia. NEURO:   Nonfocal. Well oriented.  Appropriate affect.    LABORATORY DATA:  I have reviewed the data as listed   Chemistry      Component Value Date/Time   NA 138 11/29/2013 1006   NA 140 12/17/2007 0520   K 3.5 11/29/2013 1006   K 4.4 12/17/2007 0520   CL 105 12/17/2007 0520   CO2 24 11/29/2013 1006   CO2 31 12/17/2007 0520   BUN 16.3 11/29/2013 1006   BUN 5* 12/17/2007 0520   CREATININE 0.9 11/29/2013 1006   CREATININE 0.62 12/17/2007 0520      Component Value Date/Time   CALCIUM 10.0 11/29/2013 1006   CALCIUM 8.7 12/17/2007 0520   ALKPHOS 63 11/29/2013 1006   AST 18 11/29/2013  1006   ALT 32 11/29/2013 1006   BILITOT 0.35 11/29/2013 1006       Lab Results  Component Value Date   WBC 9.0 11/29/2013   HGB 10.8* 11/29/2013   HCT 33.0* 11/29/2013   MCV 82.0 11/29/2013   PLT 375 11/29/2013   NEUTROABS 6.6* 11/29/2013     RADIOGRAPHIC STUDIES: I have personally reviewed the radiology reports and agreed with their findings. No results found.   ASSESSMENT:  53 year old [place], [place] woman with   1. 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 Pathology demonstrated heterogeneity in regards to HER-2 expression. Lumpectomy date was on 08/30/13 by Dr. Lucia Gaskins.   2. Patient began weekly Paclitaxel and Trastuzumab therapy on 10/04/13. A total of 12 weeks are planned.   3. Radiation therapy to follow chemotherapy.   4. Anti-estrogen therapy to follow radiation therapy.     PLAN:  Dalaysia is doing well today.  She will proceed with week 9 of Taxol and Herceptin. Her labs are stable.  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    Her hypertension is improved with the Lisinopril and HCTZ.  She will continue this.    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  I reviewed this  medication with her in detail.  I also gave her detailed information about it in her AVS.    DTreonnawill return next week for labs, evaluation, and treatment.    The patient has a good understanding of the overall plan. she agrees with it. She will call with any problems that may develop before her next visit here.  I spent 25 minutes counseling the patient face to face. The total time spent in the appointment was 30 minutes and more than 50% was on counseling and review of test results  LMinette Headland [phone]/27/2015 11:08 AM

## 2013-11-29 NOTE — Progress Notes (Signed)
Discharged at 1520, alone, ambulatory in no distress.

## 2013-11-29 NOTE — Patient Instructions (Signed)
Gabapentin capsules or tablets What is this medicine? GABAPENTIN (GA ba pen tin) is used to control partial seizures in adults with epilepsy. It is also used to treat certain types of nerve pain. This medicine may be used for other purposes; ask your health care provider or pharmacist if you have questions. COMMON BRAND NAME(S): Gabarone, Neurontin What should I tell my health care provider before I take this medicine? They need to know if you have any of these conditions: -kidney disease -suicidal thoughts, plans, or attempt; a previous suicide attempt by you or a family member -an unusual or allergic reaction to gabapentin, other medicines, foods, dyes, or preservatives -pregnant or trying to get pregnant -breast-feeding How should I use this medicine? Take this medicine by mouth with a glass of water. Follow the directions on the prescription label. You can take it with or without food. If it upsets your stomach, take it with food.Take your medicine at regular intervals. Do not take it more often than directed. Do not stop taking except on your doctor's advice. If you are directed to break the 600 or 800 mg tablets in half as part of your dose, the extra half tablet should be used for the next dose. If you have not used the extra half tablet within 28 days, it should be thrown away. A special MedGuide will be given to you by the pharmacist with each prescription and refill. Be sure to read this information carefully each time. Talk to your pediatrician regarding the use of this medicine in children. Special care may be needed. Overdosage: If you think you have taken too much of this medicine contact a poison control center or emergency room at once. NOTE: This medicine is only for you. Do not share this medicine with others. What if I miss a dose? If you miss a dose, take it as soon as you can. If it is almost time for your next dose, take only that dose. Do not take double or extra  doses. What may interact with this medicine? Do not take this medicine with any of the following medications: -other gabapentin products This medicine may also interact with the following medications: -alcohol -antacids -antihistamines for allergy, cough and cold -certain medicines for anxiety or sleep -certain medicines for depression or psychotic disturbances -homatropine; hydrocodone -naproxen -narcotic medicines (opiates) for pain -phenothiazines like chlorpromazine, mesoridazine, prochlorperazine, thioridazine This list may not describe all possible interactions. Give your health care provider a list of all the medicines, herbs, non-prescription drugs, or dietary supplements you use. Also tell them if you smoke, drink alcohol, or use illegal drugs. Some items may interact with your medicine. What should I watch for while using this medicine? Visit your doctor or health care professional for regular checks on your progress. You may want to keep a record at home of how you feel your condition is responding to treatment. You may want to share this information with your doctor or health care professional at each visit. You should contact your doctor or health care professional if your seizures get worse or if you have any new types of seizures. Do not stop taking this medicine or any of your seizure medicines unless instructed by your doctor or health care professional. Stopping your medicine suddenly can increase your seizures or their severity. Wear a medical identification bracelet or chain if you are taking this medicine for seizures, and carry a card that lists all your medications. You may get drowsy, dizzy, or have blurred   vision. Do not drive, use machinery, or do anything that needs mental alertness until you know how this medicine affects you. To reduce dizzy or fainting spells, do not sit or stand up quickly, especially if you are an older patient. Alcohol can increase drowsiness and  dizziness. Avoid alcoholic drinks. Your mouth may get dry. Chewing sugarless gum or sucking hard candy, and drinking plenty of water will help. The use of this medicine may increase the chance of suicidal thoughts or actions. Pay special attention to how you are responding while on this medicine. Any worsening of mood, or thoughts of suicide or dying should be reported to your health care professional right away. Women who become pregnant while using this medicine may enroll in the [place] American Antiepileptic Drug Pregnancy Registry by calling 1-888-233-2334. This registry collects information about the safety of antiepileptic drug use during pregnancy. What side effects may I notice from receiving this medicine? Side effects that you should report to your doctor or health care professional as soon as possible: -allergic reactions like skin rash, itching or hives, swelling of the face, lips, or tongue -worsening of mood, thoughts or actions of suicide or dying Side effects that usually do not require medical attention (report to your doctor or health care professional if they continue or are bothersome): -constipation -difficulty walking or controlling muscle movements -dizziness -nausea -slurred speech -tiredness -tremors -weight gain This list may not describe all possible side effects. Call your doctor for medical advice about side effects. You may report side effects to FDA at 1-800-FDA-1088. Where should I keep my medicine? Keep out of reach of children. Store at room temperature between 15 and 30 degrees C (59 and 86 degrees F). Throw away any unused medicine after the expiration date. NOTE: This sheet is a summary. It may not cover all possible information. If you have questions about this medicine, talk to your doctor, pharmacist, or health care provider.  2015, [employer]/Gold Standard. (2012-09-23 09:12:48)  

## 2013-11-29 NOTE — Patient Instructions (Signed)
[facility] Discharge Instructions for Patients Receiving Chemotherapy  Today you received the following chemotherapy agents herceptin, taxol.  To help prevent nausea and vomiting after your treatment, we encourage you to take your nausea medication compazine 10 mg, zofran 8 mg and lorazepam as needed.   If you develop nausea and vomiting that is not controlled by your nausea medication, call the clinic.   BELOW ARE SYMPTOMS THAT SHOULD BE REPORTED IMMEDIATELY:  *FEVER GREATER THAN 100.5 F  *CHILLS WITH OR WITHOUT FEVER  NAUSEA AND VOMITING THAT IS NOT CONTROLLED WITH YOUR NAUSEA MEDICATION  *UNUSUAL SHORTNESS OF BREATH  *UNUSUAL BRUISING OR BLEEDING  TENDERNESS IN MOUTH AND THROAT WITH OR WITHOUT PRESENCE OF ULCERS  *URINARY PROBLEMS  *BOWEL PROBLEMS  UNUSUAL RASH Items with * indicate a potential emergency and should be followed up as soon as possible.  Feel free to call the clinic you have any questions or concerns. The clinic phone number is [phone].

## 2013-12-06 ENCOUNTER — Other Ambulatory Visit: Payer: Self-pay | Admitting: Hematology and Oncology

## 2013-12-06 ENCOUNTER — Other Ambulatory Visit (HOSPITAL_BASED_OUTPATIENT_CLINIC_OR_DEPARTMENT_OTHER): Payer: BC Managed Care – PPO

## 2013-12-06 ENCOUNTER — Other Ambulatory Visit: Payer: BC Managed Care – PPO

## 2013-12-06 ENCOUNTER — Ambulatory Visit (HOSPITAL_BASED_OUTPATIENT_CLINIC_OR_DEPARTMENT_OTHER): Payer: BC Managed Care – PPO | Admitting: Adult Health

## 2013-12-06 ENCOUNTER — Encounter: Payer: Self-pay | Admitting: Adult Health

## 2013-12-06 ENCOUNTER — Ambulatory Visit: Payer: BC Managed Care – PPO

## 2013-12-06 ENCOUNTER — Ambulatory Visit (HOSPITAL_BASED_OUTPATIENT_CLINIC_OR_DEPARTMENT_OTHER): Payer: BC Managed Care – PPO

## 2013-12-06 VITALS — BP 114/83 | HR 116 | Temp 99.8°F | Resp 20 | Ht 66.0 in | Wt 191.9 lb

## 2013-12-06 DIAGNOSIS — C50012 Malignant neoplasm of nipple and areola, left female breast: Secondary | ICD-10-CM

## 2013-12-06 DIAGNOSIS — C50412 Malignant neoplasm of upper-outer quadrant of left female breast: Secondary | ICD-10-CM

## 2013-12-06 DIAGNOSIS — R21 Rash and other nonspecific skin eruption: Secondary | ICD-10-CM

## 2013-12-06 DIAGNOSIS — Z5111 Encounter for antineoplastic chemotherapy: Secondary | ICD-10-CM

## 2013-12-06 DIAGNOSIS — Z170 Estrogen receptor positive status [ER+]: Secondary | ICD-10-CM

## 2013-12-06 DIAGNOSIS — Z95828 Presence of other vascular implants and grafts: Secondary | ICD-10-CM

## 2013-12-06 DIAGNOSIS — E860 Dehydration: Secondary | ICD-10-CM

## 2013-12-06 LAB — CBC WITH DIFFERENTIAL/PLATELET
BASO%: 0.5 % (ref 0.0–2.0)
Basophils Absolute: 0 10*3/uL (ref 0.0–0.1)
EOS%: 0.2 % (ref 0.0–7.0)
Eosinophils Absolute: 0 10*3/uL (ref 0.0–0.5)
HCT: 32.2 % — ABNORMAL LOW (ref 34.8–46.6)
HGB: 10.4 g/dL — ABNORMAL LOW (ref 11.6–15.9)
LYMPH%: 19.6 % (ref 14.0–49.7)
MCH: 26.5 pg (ref 25.1–34.0)
MCHC: 32.3 g/dL (ref 31.5–36.0)
MCV: 82.1 fL (ref 79.5–101.0)
MONO#: 0.5 10*3/uL (ref 0.1–0.9)
MONO%: 7.4 % (ref 0.0–14.0)
NEUT#: 5.1 10*3/uL (ref 1.5–6.5)
NEUT%: 72.3 % (ref 38.4–76.8)
PLATELETS: 381 10*3/uL (ref 145–400)
RBC: 3.92 10*6/uL (ref 3.70–5.45)
RDW: 16.6 % — ABNORMAL HIGH (ref 11.2–14.5)
WBC: 7 10*3/uL (ref 3.9–10.3)
lymph#: 1.4 10*3/uL (ref 0.9–3.3)

## 2013-12-06 LAB — COMPREHENSIVE METABOLIC PANEL (CC13)
ALK PHOS: 55 U/L (ref 40–150)
ALT: 21 U/L (ref 0–55)
ANION GAP: 10 meq/L (ref 3–11)
AST: 16 U/L (ref 5–34)
Albumin: 3.4 g/dL — ABNORMAL LOW (ref 3.5–5.0)
BUN: 17.3 mg/dL (ref 7.0–26.0)
CO2: 23 mEq/L (ref 22–29)
CREATININE: 1 mg/dL (ref 0.6–1.1)
Calcium: 9.9 mg/dL (ref 8.4–10.4)
Chloride: 102 mEq/L (ref 98–109)
Glucose: 122 mg/dl (ref 70–140)
Potassium: 4.1 mEq/L (ref 3.5–5.1)
Sodium: 135 mEq/L — ABNORMAL LOW (ref 136–145)
Total Bilirubin: 0.41 mg/dL (ref 0.20–1.20)
Total Protein: 7.6 g/dL (ref 6.4–8.3)

## 2013-12-06 MED ORDER — ACETAMINOPHEN 325 MG PO TABS
ORAL_TABLET | ORAL | Status: AC
  Filled 2013-12-06: qty 2

## 2013-12-06 MED ORDER — FAMOTIDINE IN NACL 20-0.9 MG/50ML-% IV SOLN
20.0000 mg | Freq: Once | INTRAVENOUS | Status: AC
  Administered 2013-12-06: 20 mg via INTRAVENOUS

## 2013-12-06 MED ORDER — KETOCONAZOLE 2 % EX CREA
1.0000 | TOPICAL_CREAM | Freq: Every day | CUTANEOUS | Status: DC
Start: 2013-12-06 — End: 2014-01-10

## 2013-12-06 MED ORDER — ONDANSETRON 8 MG/NS 50 ML IVPB
INTRAVENOUS | Status: AC
  Filled 2013-12-06: qty 8

## 2013-12-06 MED ORDER — DEXAMETHASONE SODIUM PHOSPHATE 20 MG/5ML IJ SOLN
20.0000 mg | Freq: Once | INTRAMUSCULAR | Status: AC
  Administered 2013-12-06: 20 mg via INTRAVENOUS

## 2013-12-06 MED ORDER — DIPHENHYDRAMINE HCL 50 MG/ML IJ SOLN
INTRAMUSCULAR | Status: AC
Start: 2013-12-06 — End: 2013-12-06
  Filled 2013-12-06: qty 1

## 2013-12-06 MED ORDER — SODIUM CHLORIDE 0.9 % IV SOLN
Freq: Once | INTRAVENOUS | Status: AC
  Administered 2013-12-06: 13:00:00 via INTRAVENOUS

## 2013-12-06 MED ORDER — ACETAMINOPHEN 325 MG PO TABS
650.0000 mg | ORAL_TABLET | Freq: Once | ORAL | Status: AC
  Administered 2013-12-06: 650 mg via ORAL

## 2013-12-06 MED ORDER — SODIUM CHLORIDE 0.9 % IJ SOLN
10.0000 mL | INTRAMUSCULAR | Status: DC | PRN
  Administered 2013-12-06: 10 mL
  Filled 2013-12-06: qty 10

## 2013-12-06 MED ORDER — FAMOTIDINE IN NACL 20-0.9 MG/50ML-% IV SOLN
INTRAVENOUS | Status: AC
  Filled 2013-12-06: qty 50

## 2013-12-06 MED ORDER — DIPHENHYDRAMINE HCL 50 MG/ML IJ SOLN
50.0000 mg | Freq: Once | INTRAMUSCULAR | Status: AC
  Administered 2013-12-06: 50 mg via INTRAVENOUS

## 2013-12-06 MED ORDER — TRASTUZUMAB CHEMO INJECTION 440 MG
2.0000 mg/kg | Freq: Once | INTRAVENOUS | Status: AC
  Administered 2013-12-06: 189 mg via INTRAVENOUS
  Filled 2013-12-06: qty 9

## 2013-12-06 MED ORDER — HEPARIN SOD (PORK) LOCK FLUSH 100 UNIT/ML IV SOLN
500.0000 [IU] | Freq: Once | INTRAVENOUS | Status: AC | PRN
Start: 2013-12-06 — End: 2013-12-06
  Administered 2013-12-06: 500 [IU]
  Filled 2013-12-06: qty 5

## 2013-12-06 MED ORDER — DEXAMETHASONE SODIUM PHOSPHATE 20 MG/5ML IJ SOLN
INTRAMUSCULAR | Status: AC
  Filled 2013-12-06: qty 5

## 2013-12-06 MED ORDER — SODIUM CHLORIDE 0.9 % IJ SOLN
10.0000 mL | INTRAMUSCULAR | Status: DC | PRN
  Administered 2013-12-06: 10 mL via INTRAVENOUS
  Filled 2013-12-06: qty 10

## 2013-12-06 MED ORDER — PACLITAXEL CHEMO INJECTION 300 MG/50ML
80.0000 mg/m2 | Freq: Once | INTRAVENOUS | Status: AC
  Administered 2013-12-06: 162 mg via INTRAVENOUS
  Filled 2013-12-06: qty 27

## 2013-12-06 MED ORDER — ONDANSETRON 8 MG/50ML IVPB (CHCC)
8.0000 mg | Freq: Once | INTRAVENOUS | Status: AC
  Administered 2013-12-06: 8 mg via INTRAVENOUS

## 2013-12-06 NOTE — Patient Instructions (Signed)
Potassium Content of Foods  Potassium is a mineral found in many foods and drinks. It helps keep fluids and minerals balanced in your body and affects how steadily your heart beats. Potassium also helps control your blood pressure and keep your muscles and nervous system healthy.  Certain health conditions and medicines may change the balance of potassium in your body. When this happens, you can help balance your level of potassium through the foods that you do or do not eat. Your health care provider or dietitian may recommend an amount of potassium that you should have each day. The following lists of foods provide the amount of potassium (in parentheses) per serving in each item.  HIGH IN POTASSIUM   The following foods and beverages have 200 mg or more of potassium per serving:  · Apricots, 2 raw or 5 dry (200 mg).  · Artichoke, 1 medium (345 mg).  · Avocado, raw,  ¼ each (245 mg).  · Banana, 1 medium (425 mg).  · Beans, lima, or baked beans, canned, ½ cup (280 mg).  · Beans, white, canned, ½ cup (595 mg).  · Beef roast, 3 oz (320 mg).  · Beef, ground, 3 oz (270 mg).  · Beets, raw or cooked, ½ cup (260 mg).  · Bran muffin, 2 oz (300 mg).  · Broccoli, ½ cup (230 mg).  · Brussels sprouts, ½ cup (250 mg).  · Cantaloupe, ½ cup (215 mg).  · Cereal, 100% bran, ½ cup (200-400 mg).  · Cheeseburger, single, fast food, 1 each (225-400 mg).  · Chicken, 3 oz (220 mg).  · Clams, canned, 3 oz (535 mg).  · Crab, 3 oz (225 mg).  · Dates, 5 each (270 mg).  · Dried beans and peas, ½ cup (300-475 mg).  · Figs, dried, 2 each (260 mg).  · Fish: halibut, tuna, cod, snapper, 3 oz (480 mg).  · Fish: salmon, haddock, swordfish, perch, 3 oz (300 mg).  · Fish, tuna, canned 3 oz (200 mg).  · French fries, fast food, 3 oz (470 mg).  · Granola with fruit and nuts, ½ cup (200 mg).  · Grapefruit juice, ½ cup (200 mg).  · Greens, beet, ½ cup (655 mg).  · Honeydew melon, ½ cup (200 mg).  · Kale, raw, 1 cup (300 mg).  · Kiwi, 1 medium (240  mg).  · Kohlrabi, rutabaga, parsnips, ½ cup (280 mg).  · Lentils, ½ cup (365 mg).  · Mango, 1 each (325 mg).  · Milk, chocolate, 1 cup (420 mg).  · Milk: nonfat, low-fat, whole, buttermilk, 1 cup (350-380 mg).  · Molasses, 1 Tbsp (295 mg).  · Mushrooms, ½ cup (280) mg.  · Nectarine, 1 each (275 mg).  · Nuts: almonds, peanuts, hazelnuts, [place], cashew, mixed, 1 oz (200 mg).  · Nuts, pistachios, 1 oz (295 mg).  · Orange, 1 each (240 mg).  · Orange juice, ½ cup (235 mg).  · Papaya, medium, ½ fruit (390 mg).  · Peanut butter, chunky, 2 Tbsp (240 mg).  · Peanut butter, smooth, 2 Tbsp (210 mg).  · Pear, 1 medium (200 mg).  · Pomegranate, 1 whole (400 mg).  · Pomegranate juice, ½ cup (215 mg).  · Pork, 3 oz (350 mg).  · Potato chips, salted, 1 oz (465 mg).  · Potato, baked with skin, 1 medium (925 mg).  · Potatoes, boiled, ½ cup (255 mg).  · Potatoes, mashed, ½ cup (330 mg).  · Prune juice, ½ cup (  370 mg).  · Prunes, 5 each (305 mg).  · Pudding, chocolate, ½ cup (230 mg).  · Pumpkin, canned, ½ cup (250 mg).  · Raisins, seedless, ¼ cup (270 mg).  · Seeds, sunflower or pumpkin, 1 oz (240 mg).  · Soy milk, 1 cup (300 mg).  · Spinach, ½ cup (420 mg).  · Spinach, canned, ½ cup (370 mg).  · Sweet potato, baked with skin, 1 medium (450 mg).  · Swiss chard, ½ cup (480 mg).  · Tomato or vegetable juice, ½ cup (275 mg).  · Tomato sauce or puree, ½ cup (400-550 mg).  · Tomato, raw, 1 medium (290 mg).  · Tomatoes, canned, ½ cup (200-300 mg).  · Turkey, 3 oz (250 mg).  · Wheat germ, 1 oz (250 mg).  · Winter squash, ½ cup (250 mg).  · Yogurt, plain or fruited, 6 oz (260-435 mg).  · Zucchini, ½ cup (220 mg).  MODERATE IN POTASSIUM  The following foods and beverages have 50-200 mg of potassium per serving:  · Apple, 1 each (150 mg).  · Apple juice, ½ cup (150 mg).  · Applesauce, ½ cup (90 mg).  · Apricot nectar, ½ cup (140 mg).  · Asparagus, small spears, ½ cup or 6 spears (155 mg).  · Bagel, cinnamon raisin, 1 each (130 mg).  · Bagel,  egg or plain, 4 in., 1 each (70 mg).  · Beans, green, ½ cup (90 mg).  · Beans, yellow, ½ cup (190 mg).  · Beer, regular, 12 oz (100 mg).  · Beets, canned, ½ cup (125 mg).  · Blackberries, ½ cup (115 mg).  · Blueberries, ½ cup (60 mg).  · Bread, whole wheat, 1 slice (70 mg).  · Broccoli, raw, ½ cup (145 mg).  · Cabbage, ½ cup (150 mg).  · Carrots, cooked or raw, ½ cup (180 mg).  · Cauliflower, raw, ½ cup (150 mg).  · Celery, raw, ½ cup (155 mg).  · Cereal, bran flakes, ½cup (120-150 mg).  · Cheese, cottage, ½ cup (110 mg).  · Cherries, 10 each (150 mg).  · Chocolate, 1½ oz bar (165 mg).  · Coffee, brewed 6 oz (90 mg).  · Corn, ½ cup or 1 ear (195 mg).  · Cucumbers, ½ cup (80 mg).  · Egg, large, 1 each (60 mg).  · Eggplant, ½ cup (60 mg).  · Endive, raw, ½cup (80 mg).  · English muffin, 1 each (65 mg).  · Fish, orange roughy, 3 oz (150 mg).  · Frankfurter, beef or pork, 1 each (75 mg).  · Fruit cocktail, ½ cup (115 mg).  · Grape juice, ½ cup (170 mg).  · Grapefruit, ½ fruit (175 mg).  · Grapes, ½ cup (155 mg).  · Greens: kale, turnip, collard, ½ cup (110-150 mg).  · Ice cream or frozen yogurt, chocolate, ½ cup (175 mg).  · Ice cream or frozen yogurt, vanilla, ½ cup (120-150 mg).  · Lemons, limes, 1 each (80 mg).  · Lettuce, all types, 1 cup (100 mg).  · Mixed vegetables, ½ cup (150 mg).  · Mushrooms, raw, ½ cup (110 mg).  · Nuts: walnuts, pecans, or macadamia, 1 oz (125 mg).  · Oatmeal, ½ cup (80 mg).  · Okra, ½ cup (110 mg).  · Onions, raw, ½ cup (120 mg).  · Peach, 1 each (185 mg).  · Peaches, canned, ½ cup (120 mg).  · Pears, canned, ½ cup (120 mg).  · Peas, green,   frozen, ½ cup (90 mg).  · Peppers, green, ½ cup (130 mg).  · Peppers, red, ½ cup (160 mg).  · Pineapple juice, ½ cup (165 mg).  · Pineapple, fresh or canned, ½ cup (100 mg).  · Plums, 1 each (105 mg).  · Pudding, vanilla, ½ cup (150 mg).  · Raspberries, ½ cup (90 mg).  · Rhubarb, ½ cup (115 mg).  · Rice, wild, ½ cup (80 mg).  · Shrimp, 3 oz (155  mg).  · Spinach, raw, 1 cup (170 mg).  · Strawberries, ½ cup (125 mg).  · Summer squash ½ cup (175-200 mg).  · Swiss chard, raw, 1 cup (135 mg).  · Tangerines, 1 each (140 mg).  · Tea, brewed, 6 oz (65 mg).  · Turnips, ½ cup (140 mg).  · Watermelon, ½ cup (85 mg).  · Wine, red, table, 5 oz (180 mg).  · Wine, white, table, 5 oz (100 mg).  LOW IN POTASSIUM  The following foods and beverages have less than 50 mg of potassium per serving.  · Bread, white, 1 slice (30 mg).  · Carbonated beverages, 12 oz (less than 5 mg).  · Cheese, 1 oz (20-30 mg).  · Cranberries, ½ cup (45 mg).  · Cranberry juice cocktail, ½ cup (20 mg).  · Fats and oils, 1 Tbsp (less than 5 mg).  · Hummus, 1 Tbsp (32 mg).  · Nectar: papaya, mango, or pear, ½ cup (35 mg).  · Rice, white or brown, ½ cup (50 mg).  · Spaghetti or macaroni, ½ cup cooked (30 mg).  · Tortilla, flour or corn, 1 each (50 mg).  · Waffle, 4 in., 1 each (50 mg).  · Water chestnuts, ½ cup (40 mg).  Document Released: 09/03/2004 Document Revised: 01/25/2013 Document Reviewed: 12/17/2012  ExitCare® Patient Information ©2015 [employer]. This information is not intended to replace advice given to you by your health care provider. Make sure you discuss any questions you have with your health care provider.

## 2013-12-06 NOTE — Progress Notes (Signed)
Patient Care Team: Jean Pound, MD as PCP - General (Family Medicine) Jean Overall, MD as Consulting Physician (General Surgery) Jean Silversmith, MD as Consulting Physician (Radiation Oncology) Jean Pound, MD as Consulting Physician (Family Medicine) Jean Eisenmenger, MD as Consulting Physician (Hematology and Oncology)  DIAGNOSIS: Breast cancer of upper-outer quadrant of left female breast   Primary site: Breast (Left)   Staging method: AJCC 7th Edition   Clinical: Stage IIA (T2, N0, cM0) signed by Jean Eisenmenger, MD on 09/20/2013 10:12 AM   Pathologic: (T1c, N0, cM0)   Summary: Stage IIA (T1c, N0, cM0)   Clinical comments: Staged at breast conference 08/17/13.   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  The cancer was grade 2 and ER positive at 100%, PR+ at 91% and HER2- with a Ki67 of 47%   08/12/2013 Breast MRI 2.1 x 1.2 x 1.2 cm biopsy-proven invasive ductal carcinoma and ductal carcinoma in situ in the 9 o'clock position of the left breast   08/30/2013 Surgery Left Lumpectomy IDC with infiltrating lobular features. Pos for LVSI; 1 mm from nearest margin, DCIS 1 SLN Neg; ONCOTYPE Dx Rec score 17, 10 yr risk of recurrance is 11% with Tamoxifen alone   10/04/2013 -  Chemotherapy Weekly Taxol/Herceptin x 12    CHIEF COMPLIANT: Patient is here for week 10 of Taxol Herceptin  INTERVAL HISTORY: Jean [name] is a 53 year old American lady with above-mentioned history of HER-2 positive breast cancer. She is doing moderately well today.  She did get a "stomach bug" last week and had vomiting and diarrhea.  She started drinking again yesterday.  She does continue to have peeling  under her arms and is requesting a cream to help for this.  She does have occasional leg cramps at night and the gabapentin has resolved her numbness and tingling.  She denies fevers, chills, nausea, vomiting, constipation, diarrhea.    REVIEW OF SYSTEMS:   A 10 point review of systems was conducted and is otherwise negative except for what is noted above.     Past Medical History  Diagnosis Date  . GERD (gastroesophageal reflux disease)     no current med.  . Dental crown present   . Arthritis     back  . Breast cancer 08/2013    left   Past Surgical History  Procedure Laterality Date  . Foot surgery Right   . Abdominal hysterectomy  12/16/2007  . Lumpectory left  August 30, 2013  . Portacath placement Right 09/26/2013    Procedure: INSERTION PORT-A-CATH;  Surgeon: Jean Medal, MD;  Location: [facility];  Service: General;  Laterality: Right;   Family History  Problem Relation Age of Onset  . Breast cancer Paternal Aunt   . Cervical cancer Paternal Aunt   . Colon cancer Paternal Uncle   . Prostate cancer Paternal Uncle      ALLERGIES:  is allergic to hydrocodone.  MEDICATIONS:  Current Outpatient Prescriptions  Medication Sig Dispense Refill  . cholecalciferol (VITAMIN D) 1000 UNITS tablet Take 1,000 Units by mouth daily.    . Cyanocobalamin (VITAMIN B 12 PO) Take 1 tablet by mouth daily.    Marland Kitchen dexamethasone (DECADRON) 4 MG  tablet Take 1 tablet (4 mg total) by mouth 2 (two) times daily with a meal. Day after chemo for 2 days 30 tablet 1  . gabapentin (NEURONTIN) 300 MG capsule Take 1 capsule (300 mg total) by mouth at bedtime. 30 capsule 0  . lidocaine-prilocaine (EMLA) cream Apply 1 application topically as needed. 30 g 3  . lisinopril-hydrochlorothiazide (PRINZIDE,ZESTORETIC) 10-12.5 MG per tablet Take 1 tablet by mouth daily. Pt takes a lisinopril-HCTZ 12.5-12.5 mg per tablet daily    . LORazepam (ATIVAN) 0.5 MG tablet Take 1 tablet (0.5 mg total) by mouth every 6 (six)  hours as needed (as needed for nausea and vomiting). 30 tablet 0  . Multiple Vitamin (MULTIVITAMIN) tablet Take 1 tablet by mouth daily.    Marland Kitchen omeprazole (PRILOSEC) 40 MG capsule Take 1 capsule (40 mg total) by mouth daily. 30 capsule 3  . ondansetron (ZOFRAN) 8 MG tablet Take 1 tablet (8 mg total) by mouth 2 (two) times daily. 30 tablet 1  . UNABLE TO FIND Cranial Prothesis    . zolpidem (AMBIEN) 5 MG tablet Take 5 mg by mouth at bedtime as needed for sleep.    . hydrochlorothiazide (HYDRODIURIL) 12.5 MG tablet   1  . HYDROcodone-acetaminophen (NORCO/VICODIN) 5-325 MG per tablet     . ketoconazole (NIZORAL) 2 % cream Apply 1 application topically daily. 15 g 0  . oxyCODONE-acetaminophen (PERCOCET) 10-325 MG per tablet Take 1 tablet by mouth every 4 (four) hours as needed for pain. 30 tablet 0  . prochlorperazine (COMPAZINE) 10 MG tablet Take 1 tablet (10 mg total) by mouth every 6 (six) hours as needed for nausea or vomiting. 30 tablet 1  . venlafaxine XR (EFFEXOR-XR) 37.5 MG 24 hr capsule Take 1 capsule (37.5 mg total) by mouth daily with breakfast. 30 capsule 0   No current facility-administered medications for this visit.   Facility-Administered Medications Ordered in Other Visits  Medication Dose Route Frequency Provider Last Rate Last Dose  . famotidine (PEPCID) IVPB 20 mg  20 mg Intravenous Once Jean Eisenmenger, MD      . heparin lock flush 100 unit/mL  500 Units Intracatheter Once PRN Jean Eisenmenger, MD      . ondansetron (ZOFRAN) IVPB 8 mg  8 mg Intravenous Once Jean Eisenmenger, MD   8 mg at 12/06/13 1322  . PACLitaxel (TAXOL) 162 mg in dextrose 5 % 250 mL chemo infusion (</= 11m/m2)  80 mg/m2 (Treatment Plan Actual) Intravenous Once VRulon Eisenmenger MD      . sodium chloride 0.9 % injection 10 mL  10 mL Intracatheter PRN VRulon Eisenmenger MD      . trastuzumab (HERCEPTIN) 189 mg in sodium chloride 0.9 % 250 mL chemo infusion  2 mg/kg (Treatment Plan Actual) Intravenous Once VRulon Eisenmenger  MD        PHYSICAL EXAMINATION: ECOG PERFORMANCE STATUS: 1 - Symptomatic but completely ambulatory  Filed Vitals:   12/06/13 1200  BP: 114/83  Pulse: 116  Temp: 99.8 F (37.7 C)  Resp: 20   Filed Weights   12/06/13 1200  Weight: 191 lb 14.4 oz (87.045 kg)   GENERAL: Patient is a well appearing female in no acute distress HEENT:  Sclerae anicteric.  Oropharynx clear and moist. No ulcerations or evidence of oropharyngeal candidiasis. Neck is supple.  NODES:  No cervical, supraclavicular, or axillary lymphadenopathy palpated.  BREAST EXAM:  Deferred. LUNGS:  Clear to auscultation bilaterally.  No wheezes or rhonchi. HEART:  Regular rate and rhythm. HR rechecked at 100.  No murmur appreciated. ABDOMEN:  Soft, nontender.  Positive, normoactive bowel sounds. No organomegaly palpated. MSK:  No focal spinal tenderness to palpation. Full range of motion bilaterally in the upper extremities. EXTREMITIES:  No peripheral edema.   SKIN:  Peeling in axilla bilaterally No nail dyscrasia. NEURO:  Nonfocal. Well oriented.  Appropriate affect.    LABORATORY DATA:  I have reviewed the data as listed   Chemistry      Component Value Date/Time   NA 135* 12/06/2013 1132   NA 140 12/17/2007 0520   K 4.1 12/06/2013 1132   K 4.4 12/17/2007 0520   CL 105 12/17/2007 0520   CO2 23 12/06/2013 1132   CO2 31 12/17/2007 0520   BUN 17.3 12/06/2013 1132   BUN 5* 12/17/2007 0520   CREATININE 1.0 12/06/2013 1132   CREATININE 0.62 12/17/2007 0520      Component Value Date/Time   CALCIUM 9.9 12/06/2013 1132   CALCIUM 8.7 12/17/2007 0520   ALKPHOS 55 12/06/2013 1132   AST 16 12/06/2013 1132   ALT 21 12/06/2013 1132   BILITOT 0.41 12/06/2013 1132       Lab Results  Component Value Date   WBC 7.0 12/06/2013   HGB 10.4* 12/06/2013   HCT 32.2* 12/06/2013   MCV 82.1 12/06/2013   PLT 381 12/06/2013   NEUTROABS 5.1 12/06/2013     RADIOGRAPHIC STUDIES: I have personally reviewed the  radiology reports and agreed with their findings. No results found.   ASSESSMENT:  53 year old [place], [place] woman with   1. 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 Pathology demonstrated heterogeneity in regards to HER-2 expression. Lumpectomy date was on 08/30/13 by Dr. Lucia [name].   2. Patient began weekly Paclitaxel and Trastuzumab therapy on 10/04/13. A total of 12 weeks are planned.   3. Radiation therapy to follow chemotherapy.   4. Anti-estrogen therapy to follow radiation therapy.     PLAN:  Jean [name] is doing well today.  Her lab work is stable and I reviewed them with her in detail.  She will proceed with week 10 of Taxol/Herceptin treatment today.    I prescribed nizoral cream for the peeling under her arms as it does appear fungal in nature.  Jean [name] will return next week for labs, evaluation, and treatment.    The patient has a good understanding of the [name] plan. she agrees with it. She will call with any problems that may develop before her next visit here.  I spent 25 minutes counseling the patient face to face. The total time spent in the appointment was 30 minutes and more than 50% was on counseling and review of test results  Jean [name], [place] [phone] 12/06/2013 1:24 PM

## 2013-12-06 NOTE — Patient Instructions (Signed)

## 2013-12-06 NOTE — Patient Instructions (Signed)
[facility] Discharge Instructions for Patients Receiving Chemotherapy  Today you received the following chemotherapy agents: Taxol and Herceptin.  To help prevent nausea and vomiting after your treatment, we encourage you to take your nausea medication as prescribed.   If you develop nausea and vomiting that is not controlled by your nausea medication, call the clinic.   BELOW ARE SYMPTOMS THAT SHOULD BE REPORTED IMMEDIATELY:  *FEVER GREATER THAN 100.5 F  *CHILLS WITH OR WITHOUT FEVER  NAUSEA AND VOMITING THAT IS NOT CONTROLLED WITH YOUR NAUSEA MEDICATION  *UNUSUAL SHORTNESS OF BREATH  *UNUSUAL BRUISING OR BLEEDING  TENDERNESS IN MOUTH AND THROAT WITH OR WITHOUT PRESENCE OF ULCERS  *URINARY PROBLEMS  *BOWEL PROBLEMS  UNUSUAL RASH Items with * indicate a potential emergency and should be followed up as soon as possible.  Feel free to call the clinic you have any questions or concerns. The clinic phone number is [phone].    

## 2013-12-06 NOTE — Progress Notes (Signed)
Per Mendel Ryder, NP okay to treat today with HR 113.

## 2013-12-07 ENCOUNTER — Other Ambulatory Visit (HOSPITAL_COMMUNITY): Payer: Self-pay | Admitting: Internal Medicine

## 2013-12-07 ENCOUNTER — Ambulatory Visit (HOSPITAL_COMMUNITY)
Admission: RE | Admit: 2013-12-07 | Discharge: 2013-12-07 | Disposition: A | Discharge status: Home / Self Care | Payer: BC Managed Care – PPO | Source: Ambulatory Visit | Attending: Family Medicine | Admitting: Family Medicine

## 2013-12-07 ENCOUNTER — Ambulatory Visit (HOSPITAL_BASED_OUTPATIENT_CLINIC_OR_DEPARTMENT_OTHER)
Admission: RE | Admit: 2013-12-07 | Discharge: 2013-12-07 | Disposition: A | Discharge status: Home / Self Care | Payer: BC Managed Care – PPO | Source: Ambulatory Visit | Attending: Internal Medicine | Admitting: Internal Medicine

## 2013-12-07 VITALS — BP 112/68 | HR 99 | Wt 194.5 lb

## 2013-12-07 DIAGNOSIS — I519 Heart disease, unspecified: Secondary | ICD-10-CM

## 2013-12-07 DIAGNOSIS — C50912 Malignant neoplasm of unspecified site of left female breast: Secondary | ICD-10-CM

## 2013-12-07 DIAGNOSIS — Z79899 Other long term (current) drug therapy: Secondary | ICD-10-CM | POA: Diagnosis not present

## 2013-12-07 DIAGNOSIS — Z8249 Family history of ischemic heart disease and other diseases of the circulatory system: Secondary | ICD-10-CM | POA: Diagnosis not present

## 2013-12-07 DIAGNOSIS — C50412 Malignant neoplasm of upper-outer quadrant of left female breast: Secondary | ICD-10-CM

## 2013-12-07 NOTE — Progress Notes (Signed)
Echo Lab  2D Echocardiogram completed.  Brandon, RDCS 12/07/2013 9:46 AM

## 2013-12-07 NOTE — Progress Notes (Signed)
Patient ID: Jean [name], female   DOB: 1960/06/12, 53 y.o.   MRN: [id number] Primary Oncologist: Dr. Lindi Adie  53 yo presents to cardio-oncology clinic for evaluation in the setting of Herceptin use. Patient had left breast cancer diagnosed in 7/15.  ER+/PR+/HER-2+.  She had left lumpectomy in 7/15 and is undergoing adjuvant chemo with Taxol/Herceptin to be followed by radiation and Herceptin every 3 wks x 1 year.   So far, she is doing well.  No exertional chest pain or exertional dyspnea.  BP is not elevated.  Of note, patient has a strong family history of CAD.  Echo was done today and reviewed.  Images were not as good as on last echo, but overall EF is preserved.  Strain is lower but images from which strain was derived did not show endocardium well.   PMH: 1. Depression 2. HTN: No meds at this time.  3. H/o hysterectomy 4. Breast cancer: left breast cancer diagnosed in 7/15.  ER+/PR+/HER-2+.  She had left lumpectomy in 7/15 and is undergoing adjuvant chemo with Taxol/Herceptin to be followed by radiation and Herceptin every 3 wks x 1 year.   - Echo (8/15) with EF 55-60%, grade II diastolic dysfunction, GLS -18%, lateral s' 11.4.  - Echo (11/15) with EF 20-35%, grade I diastolic dysfunction, GLS -15.9%, lateral s' 11.9.   SH: Prior smoker, quit in 2010.  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    FH: Father with MI at 17 and CHF, maternal GF with MI, brother with CABG around age 68.    ROS: All systems reviewed and negative except as per HPI.   Current Outpatient Prescriptions  Medication Sig Dispense Refill  . cholecalciferol (VITAMIN D) 1000 UNITS tablet Take 1,000 Units by mouth daily.    . Cyanocobalamin (VITAMIN B 12 PO) Take 1 tablet by mouth daily.    Marland Kitchen dexamethasone (DECADRON) 4 MG tablet Take 1 tablet (4 mg total) by mouth 2 (two) times daily with a meal. Day after chemo for 2 days 30 tablet 1  . gabapentin (NEURONTIN) 300 MG capsule Take 1 capsule  (300 mg total) by mouth at bedtime. 30 capsule 0  . hydrochlorothiazide (HYDRODIURIL) 12.5 MG tablet   1  . HYDROcodone-acetaminophen (NORCO/VICODIN) 5-325 MG per tablet     . ketoconazole (NIZORAL) 2 % cream Apply 1 application topically daily. 15 g 0  . lidocaine-prilocaine (EMLA) cream Apply 1 application topically as needed. 30 g 3  . lisinopril-hydrochlorothiazide (PRINZIDE,ZESTORETIC) 10-12.5 MG per tablet Take 1 tablet by mouth daily. Pt takes a lisinopril-HCTZ 12.5-12.5 mg per tablet daily    . LORazepam (ATIVAN) 0.5 MG tablet Take 1 tablet (0.5 mg total) by mouth every 6 (six) hours as needed (as needed for nausea and vomiting). 30 tablet 0  . Multiple Vitamin (MULTIVITAMIN) tablet Take 1 tablet by mouth daily.    Marland Kitchen omeprazole (PRILOSEC) 40 MG capsule Take 1 capsule (40 mg total) by mouth daily. 30 capsule 3  . ondansetron (ZOFRAN) 8 MG tablet Take 1 tablet (8 mg total) by mouth 2 (two) times daily. 30 tablet 1  . oxyCODONE-acetaminophen (PERCOCET) 10-325 MG per tablet Take 1 tablet by mouth every 4 (four) hours as needed for pain. 30 tablet 0  . prochlorperazine (COMPAZINE) 10 MG tablet Take 1 tablet (10 mg total) by mouth every 6 (six) hours as needed for nausea or vomiting. 30 tablet 1  . UNABLE TO FIND Cranial Prothesis    .  venlafaxine XR (EFFEXOR-XR) 37.5 MG 24 hr capsule Take 1 capsule (37.5 mg total) by mouth daily with breakfast. 30 capsule 0  . zolpidem (AMBIEN) 5 MG tablet Take 5 mg by mouth at bedtime as needed for sleep.     No current facility-administered medications for this encounter.    BP 112/68 mmHg  Pulse 99  Wt 194 lb 8 oz (88.225 kg)  SpO2 99%  LMP 11/03/2008 General: NAD Neck: No JVD, no thyromegaly or thyroid nodule.  Lungs: Clear to auscultation bilaterally with normal respiratory effort. CV: Nondisplaced PMI.  Heart regular S1/S2, no S3/S4, no murmur.  No peripheral edema.  No carotid bruit.  Normal pedal pulses.  Abdomen: Soft, nontender, no  hepatosplenomegaly, no distention.  Skin: Intact without lesions or rashes.  Neurologic: Alert and oriented x 3.  Psych: Normal affect. Extremities: No clubbing or cyanosis.  HEENT: Normal.   Assessment/Plan: 1. Breast cancer: Patient will be getting Herceptin for 1 year.  No cardiac symptoms.  I reviewed today's echo. EF and lateral s' are stable, global longitudinal strain is lower but images are not as good as on last echo (endocardium not well-visualized).  As other indices are stable, I will not make any changes.  She will return in 3 months for echo and office visit.  2. Family history of CAD: Patient has a strong FH of premature CAD.  BP is elevated a bit today.  Will need to follow this closely.  I would also have a low threshold to treat with a statin.   [name] 12/07/2013

## 2013-12-07 NOTE — Patient Instructions (Signed)
We will contact you in 3 months to schedule your next appointment and echocardiogram  

## 2013-12-09 ENCOUNTER — Other Ambulatory Visit: Payer: Self-pay | Admitting: Adult Health

## 2013-12-13 ENCOUNTER — Ambulatory Visit: Payer: BC Managed Care – PPO

## 2013-12-13 ENCOUNTER — Ambulatory Visit (HOSPITAL_BASED_OUTPATIENT_CLINIC_OR_DEPARTMENT_OTHER): Payer: BC Managed Care – PPO | Admitting: Adult Health

## 2013-12-13 ENCOUNTER — Other Ambulatory Visit (HOSPITAL_BASED_OUTPATIENT_CLINIC_OR_DEPARTMENT_OTHER): Payer: BC Managed Care – PPO

## 2013-12-13 ENCOUNTER — Encounter: Payer: Self-pay | Admitting: Adult Health

## 2013-12-13 ENCOUNTER — Other Ambulatory Visit: Payer: BC Managed Care – PPO

## 2013-12-13 ENCOUNTER — Ambulatory Visit (HOSPITAL_BASED_OUTPATIENT_CLINIC_OR_DEPARTMENT_OTHER): Payer: BC Managed Care – PPO

## 2013-12-13 VITALS — BP 125/88 | HR 118 | Temp 99.0°F | Resp 20 | Ht 66.0 in | Wt 193.3 lb

## 2013-12-13 DIAGNOSIS — C50812 Malignant neoplasm of overlapping sites of left female breast: Secondary | ICD-10-CM

## 2013-12-13 DIAGNOSIS — Z5111 Encounter for antineoplastic chemotherapy: Secondary | ICD-10-CM

## 2013-12-13 DIAGNOSIS — Z170 Estrogen receptor positive status [ER+]: Secondary | ICD-10-CM

## 2013-12-13 DIAGNOSIS — Z95828 Presence of other vascular implants and grafts: Secondary | ICD-10-CM

## 2013-12-13 DIAGNOSIS — C50412 Malignant neoplasm of upper-outer quadrant of left female breast: Secondary | ICD-10-CM

## 2013-12-13 DIAGNOSIS — Z5112 Encounter for antineoplastic immunotherapy: Secondary | ICD-10-CM

## 2013-12-13 LAB — CBC WITH DIFFERENTIAL/PLATELET
BASO%: 0.5 % (ref 0.0–2.0)
BASOS ABS: 0 10*3/uL (ref 0.0–0.1)
EOS ABS: 0 10*3/uL (ref 0.0–0.5)
EOS%: 0.2 % (ref 0.0–7.0)
HCT: 28.8 % — ABNORMAL LOW (ref 34.8–46.6)
HEMOGLOBIN: 9.3 g/dL — AB (ref 11.6–15.9)
LYMPH%: 22.6 % (ref 14.0–49.7)
MCH: 26.7 pg (ref 25.1–34.0)
MCHC: 32.3 g/dL (ref 31.5–36.0)
MCV: 82.6 fL (ref 79.5–101.0)
MONO#: 0.5 10*3/uL (ref 0.1–0.9)
MONO%: 7.1 % (ref 0.0–14.0)
NEUT#: 4.8 10*3/uL (ref 1.5–6.5)
NEUT%: 69.6 % (ref 38.4–76.8)
Platelets: 338 10*3/uL (ref 145–400)
RBC: 3.49 10*6/uL — AB (ref 3.70–5.45)
RDW: 16.5 % — ABNORMAL HIGH (ref 11.2–14.5)
WBC: 6.9 10*3/uL (ref 3.9–10.3)
lymph#: 1.6 10*3/uL (ref 0.9–3.3)

## 2013-12-13 LAB — COMPREHENSIVE METABOLIC PANEL (CC13)
ALT: 21 U/L (ref 0–55)
ANION GAP: 8 meq/L (ref 3–11)
AST: 17 U/L (ref 5–34)
Albumin: 3.2 g/dL — ABNORMAL LOW (ref 3.5–5.0)
Alkaline Phosphatase: 54 U/L (ref 40–150)
BUN: 11.9 mg/dL (ref 7.0–26.0)
CO2: 25 meq/L (ref 22–29)
Calcium: 9.6 mg/dL (ref 8.4–10.4)
Chloride: 104 mEq/L (ref 98–109)
Creatinine: 0.9 mg/dL (ref 0.6–1.1)
GLUCOSE: 137 mg/dL (ref 70–140)
Potassium: 3.9 mEq/L (ref 3.5–5.1)
SODIUM: 138 meq/L (ref 136–145)
Total Bilirubin: 0.41 mg/dL (ref 0.20–1.20)
Total Protein: 7.3 g/dL (ref 6.4–8.3)

## 2013-12-13 MED ORDER — ONDANSETRON 8 MG/50ML IVPB (CHCC)
8.0000 mg | Freq: Once | INTRAVENOUS | Status: AC
  Administered 2013-12-13: 8 mg via INTRAVENOUS

## 2013-12-13 MED ORDER — DIPHENHYDRAMINE HCL 50 MG/ML IJ SOLN
50.0000 mg | Freq: Once | INTRAMUSCULAR | Status: AC
  Administered 2013-12-13: 50 mg via INTRAVENOUS

## 2013-12-13 MED ORDER — DEXAMETHASONE SODIUM PHOSPHATE 20 MG/5ML IJ SOLN
INTRAMUSCULAR | Status: AC
  Filled 2013-12-13: qty 5

## 2013-12-13 MED ORDER — ACETAMINOPHEN 325 MG PO TABS
650.0000 mg | ORAL_TABLET | Freq: Once | ORAL | Status: AC
  Administered 2013-12-13: 650 mg via ORAL

## 2013-12-13 MED ORDER — FAMOTIDINE IN NACL 20-0.9 MG/50ML-% IV SOLN
20.0000 mg | Freq: Once | INTRAVENOUS | Status: AC
  Administered 2013-12-13: 20 mg via INTRAVENOUS

## 2013-12-13 MED ORDER — HEPARIN SOD (PORK) LOCK FLUSH 100 UNIT/ML IV SOLN
500.0000 [IU] | Freq: Once | INTRAVENOUS | Status: AC | PRN
  Administered 2013-12-13: 500 [IU]
  Filled 2013-12-13: qty 5

## 2013-12-13 MED ORDER — ACETAMINOPHEN 325 MG PO TABS
ORAL_TABLET | ORAL | Status: AC
  Filled 2013-12-13: qty 2

## 2013-12-13 MED ORDER — FAMOTIDINE IN NACL 20-0.9 MG/50ML-% IV SOLN
INTRAVENOUS | Status: AC
  Filled 2013-12-13: qty 50

## 2013-12-13 MED ORDER — DIPHENHYDRAMINE HCL 50 MG/ML IJ SOLN
INTRAMUSCULAR | Status: AC
  Filled 2013-12-13: qty 1

## 2013-12-13 MED ORDER — SODIUM CHLORIDE 0.9 % IV SOLN
2.0000 mg/kg | Freq: Once | INTRAVENOUS | Status: AC
  Administered 2013-12-13: 189 mg via INTRAVENOUS
  Filled 2013-12-13: qty 9

## 2013-12-13 MED ORDER — SODIUM CHLORIDE 0.9 % IJ SOLN
10.0000 mL | INTRAMUSCULAR | Status: DC | PRN
  Administered 2013-12-13: 10 mL
  Filled 2013-12-13: qty 10

## 2013-12-13 MED ORDER — DEXAMETHASONE SODIUM PHOSPHATE 20 MG/5ML IJ SOLN
20.0000 mg | Freq: Once | INTRAMUSCULAR | Status: AC
  Administered 2013-12-13: 20 mg via INTRAVENOUS

## 2013-12-13 MED ORDER — SODIUM CHLORIDE 0.9 % IV SOLN
Freq: Once | INTRAVENOUS | Status: AC
  Administered 2013-12-13: 14:00:00 via INTRAVENOUS

## 2013-12-13 MED ORDER — ONDANSETRON 8 MG/NS 50 ML IVPB
INTRAVENOUS | Status: AC
  Filled 2013-12-13: qty 8

## 2013-12-13 MED ORDER — SODIUM CHLORIDE 0.9 % IJ SOLN
10.0000 mL | INTRAMUSCULAR | Status: DC | PRN
  Administered 2013-12-13: 10 mL via INTRAVENOUS
  Filled 2013-12-13: qty 10

## 2013-12-13 MED ORDER — DEXTROSE 5 % IV SOLN
80.0000 mg/m2 | Freq: Once | INTRAVENOUS | Status: AC
  Administered 2013-12-13: 162 mg via INTRAVENOUS
  Filled 2013-12-13: qty 27

## 2013-12-13 NOTE — Patient Instructions (Signed)

## 2013-12-13 NOTE — Progress Notes (Signed)
Jean [name] [name] Care Team: Jean [name] Pound, MD as PCP - General (Family Medicine) Jean [name] Overall, MD as Consulting Physician (General Surgery) Jean Silversmith, MD as Consulting Physician (Radiation Oncology) Jean [name] Pound, MD as Consulting Physician (Family Medicine) Jean [name] Eisenmenger, MD as Consulting Physician (Hematology and Oncology)  DIAGNOSIS: Breast cancer of upper-outer quadrant of left female breast   Primary site: Breast (Left)   Staging method: AJCC 7th Edition   Clinical: Stage IIA (T2, N0, cM0) signed by Jean [name] Eisenmenger, MD on 09/20/2013 10:12 AM   Pathologic: (T1c, N0, cM0)   Summary: Stage IIA (T1c, N0, cM0)   Clinical comments: Staged at breast conference 08/17/13.   SUMMARY OF ONCOLOGIC HISTORY:   Breast cancer of upper-outer quadrant of left female breast   07/22/2013 Mammogram 1.8 cm mass with mammographic and ultrasound features suspicious for malignancy in Jean [name] 9 o'clock position of Jean [name] left breast   08/12/2013 Initial Diagnosis Breast cancer of upper-outer quadrant of left female breast.  invasive ductal carcinoma plus DCIS.  Jean [name] cancer was grade 2 and ER positive at 100%, PR+ at 91% and HER2- with a Ki67 of 47%   08/12/2013 Breast MRI 2.1 x 1.2 x 1.2 cm biopsy-proven invasive ductal carcinoma and ductal carcinoma in situ in Jean [name] 9 o'clock position of Jean [name] left breast   08/30/2013 Surgery Left Lumpectomy IDC with infiltrating lobular features. Pos for LVSI; 1 mm from nearest margin, DCIS 1 SLN Neg; ONCOTYPE Dx Rec score 17, 10 yr risk of recurrance is 11% with Tamoxifen alone   10/04/2013 -  Chemotherapy Weekly Taxol/Herceptin x 12    CHIEF COMPLIANT: Jean [name] [name] is here for week 11 of Taxol Herceptin  INTERVAL HISTORY: Jean [name] Jean [name] [name] is a 53 year old American lady with above-mentioned history of HER-2 positive breast cancer. She is feeling more fatigued.  She does note that she feels more depressed and is planning on starting Jean [name] Effexor that I prescribed a few weeks ago.  She tells me her arms are  improved.  She denies fevers, chills, nausea, vomiting, constipation, diarrhea, numbness/tingling, or any further concerns.   REVIEW OF SYSTEMS:   A 10 point review of systems was conducted and is otherwise negative except for what is noted above.     Past Medical History  Diagnosis Date  . GERD (gastroesophageal reflux disease)     no current med.  . Dental crown present   . Arthritis     back  . Breast cancer 08/2013    left   Past Surgical History  Procedure Laterality Date  . Foot surgery Right   . Abdominal hysterectomy  12/16/2007  . Lumpectory left  August 30, 2013  . Portacath placement Right 09/26/2013    Procedure: INSERTION PORT-A-CATH;  Surgeon: Jean [name] Medal, MD;  Location: [facility];  Service: General;  Laterality: Right;   Family History  Problem Relation Age of Onset  . Breast cancer Paternal Aunt   . Cervical cancer Paternal Aunt   . Colon cancer Paternal Uncle   . Prostate cancer Paternal Uncle      ALLERGIES:  is allergic to hydrocodone.  MEDICATIONS:  Current Outpatient Prescriptions  Medication Sig Dispense Refill  . cholecalciferol (VITAMIN D) 1000 UNITS tablet Take 1,000 Units by mouth daily.    . Cyanocobalamin (VITAMIN B 12 PO) Take 1 tablet by mouth daily.    Marland Kitchen dexamethasone (DECADRON) 4 MG tablet Take 1 tablet (4 mg total) by mouth 2 (two) times daily with a meal. Day after chemo for 2 days 30  tablet 1  . gabapentin (NEURONTIN) 300 MG capsule Take 1 capsule (300 mg total) by mouth at bedtime. 30 capsule 0  . hydrochlorothiazide (HYDRODIURIL) 12.5 MG tablet   1  . lidocaine-prilocaine (EMLA) cream Apply 1 application topically as needed. 30 g 3  . Multiple Vitamin (MULTIVITAMIN) tablet Take 1 tablet by mouth daily.    Marland Kitchen omeprazole (PRILOSEC) 40 MG capsule Take 1 capsule (40 mg total) by mouth daily. 30 capsule 3  . ondansetron (ZOFRAN) 8 MG tablet Take 1 tablet (8 mg total) by mouth 2 (two) times daily. 30 tablet 1  . prochlorperazine (COMPAZINE) 10  MG tablet Take 1 tablet (10 mg total) by mouth every 6 (six) hours as needed for nausea or vomiting. 30 tablet 1  . UNABLE TO FIND Cranial Prothesis    . zolpidem (AMBIEN) 5 MG tablet Take 5 mg by mouth at bedtime as needed for sleep.    Marland Kitchen HYDROcodone-acetaminophen (NORCO/VICODIN) 5-325 MG per tablet     . ketoconazole (NIZORAL) 2 % cream Apply 1 application topically daily. 15 g 0  . LORazepam (ATIVAN) 0.5 MG tablet Take 1 tablet (0.5 mg total) by mouth every 6 (six) hours as needed (as needed for nausea and vomiting). 30 tablet 0  . oxyCODONE-acetaminophen (PERCOCET) 10-325 MG per tablet Take 1 tablet by mouth every 4 (four) hours as needed for pain. 30 tablet 0  . venlafaxine XR (EFFEXOR-XR) 37.5 MG 24 hr capsule TAKE ONE CAPSULE BY MOUTH EVERY DAY WITH BREAKFAST 30 capsule 0   No current facility-administered medications for this visit.    PHYSICAL EXAMINATION: ECOG PERFORMANCE STATUS: 1 - Symptomatic but completely ambulatory  Filed Vitals:   12/13/13 1151  BP: 125/88  Pulse: 118  Temp: 99 F (37.2 C)  Resp: 20   Filed Weights   12/13/13 1151  Weight: 193 lb 4.8 oz (87.68 kg)   GENERAL: Jean [name] [name] is a well appearing female in no acute distress HEENT:  Sclerae anicteric.  Oropharynx clear and moist. No ulcerations or evidence of oropharyngeal candidiasis. Neck is supple.  NODES:  No cervical, supraclavicular, or axillary lymphadenopathy palpated.  BREAST EXAM:  Deferred. LUNGS:  Clear to auscultation bilaterally.  No wheezes or rhonchi. HEART:  Regular rate and rhythm. HR rechecked at 100.  No murmur appreciated. ABDOMEN:  Soft, nontender.  Positive, normoactive bowel sounds. No organomegaly palpated. MSK:  No focal spinal tenderness to palpation. Full range of motion bilaterally in Jean [name] upper extremities. EXTREMITIES:  No peripheral edema.   SKIN:  Peeling in axilla bilaterally No nail dyscrasia. NEURO:  Nonfocal. Well oriented.  Appropriate affect.    LABORATORY DATA:  I  have reviewed Jean [name] data as listed   Chemistry      Component Value Date/Time   NA 138 12/13/2013 1127   NA 140 12/17/2007 0520   K 3.9 12/13/2013 1127   K 4.4 12/17/2007 0520   CL 105 12/17/2007 0520   CO2 25 12/13/2013 1127   CO2 31 12/17/2007 0520   BUN 11.9 12/13/2013 1127   BUN 5* 12/17/2007 0520   CREATININE 0.9 12/13/2013 1127   CREATININE 0.62 12/17/2007 0520      Component Value Date/Time   CALCIUM 9.6 12/13/2013 1127   CALCIUM 8.7 12/17/2007 0520   ALKPHOS 54 12/13/2013 1127   AST 17 12/13/2013 1127   ALT 21 12/13/2013 1127   BILITOT 0.41 12/13/2013 1127       Lab Results  Component Value Date   WBC 6.9 12/13/2013  HGB 9.3* 12/13/2013   HCT 28.8* 12/13/2013   MCV 82.6 12/13/2013   PLT 338 12/13/2013   NEUTROABS 4.8 12/13/2013     RADIOGRAPHIC STUDIES: I have personally reviewed Jean [name] radiology reports and agreed with their findings. No results found.   ASSESSMENT:  53 year old [place], [place] woman with   1. 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 Pathology demonstrated heterogeneity in regards to HER-2 expression. Lumpectomy date was on 08/30/13 by Dr. Lucia Gaskins.   2. Jean [name] [name] began weekly Paclitaxel and Trastuzumab therapy on 10/04/13. A total of 12 weeks are planned.   3. Radiation therapy to follow chemotherapy.   4. Anti-estrogen therapy to follow radiation therapy.     PLAN:  Jean [name] Jean [name] [name] is doing well today.  Her lab work is stable and I reviewed them with her in detail.  She will proceed with week 11 of Taxol/Herceptin treatment today.    Jean [name] Jean [name] [name] will start taking Jean [name] effexor daily.  We did discuss Jean [name] possible side effects of therapy.  She knows to call me if she has any problems whatsoever taking this medication.   Jean [name] Jean [name] [name] will return next week for labs, evaluation, and treatment.    Jean [name] Jean [name] [name] has a good understanding of Jean [name] Jean [name] [name] plan. she agrees with it. She will call with any  problems that may develop before her next visit here.  I spent 25 minutes counseling Jean [name] Jean [name] [name] face to face. Jean [name] total time spent in Jean [name] appointment was 30 minutes and more than 50% was on counseling and review of test results  Minette Headland, [place] [phone] 12/13/2013 12:36 PM

## 2013-12-13 NOTE — Patient Instructions (Signed)
[facility] Discharge Instructions for Patients Receiving Chemotherapy  Today you received the following chemotherapy agents taxol, herceptin  To help prevent nausea and vomiting after your treatment, we encourage you to take your nausea medication as needed   If you develop nausea and vomiting that is not controlled by your nausea medication, call the clinic.   BELOW ARE SYMPTOMS THAT SHOULD BE REPORTED IMMEDIATELY:  *FEVER GREATER THAN 100.5 F  *CHILLS WITH OR WITHOUT FEVER  NAUSEA AND VOMITING THAT IS NOT CONTROLLED WITH YOUR NAUSEA MEDICATION  *UNUSUAL SHORTNESS OF BREATH  *UNUSUAL BRUISING OR BLEEDING  TENDERNESS IN MOUTH AND THROAT WITH OR WITHOUT PRESENCE OF ULCERS  *URINARY PROBLEMS  *BOWEL PROBLEMS  UNUSUAL RASH Items with * indicate a potential emergency and should be followed up as soon as possible.  Feel free to call the clinic you have any questions or concerns. The clinic phone number is [phone].

## 2013-12-16 ENCOUNTER — Other Ambulatory Visit: Payer: Self-pay | Admitting: Adult Health

## 2013-12-20 ENCOUNTER — Telehealth: Payer: Self-pay | Admitting: Adult Health

## 2013-12-20 ENCOUNTER — Ambulatory Visit: Payer: BC Managed Care – PPO

## 2013-12-20 ENCOUNTER — Encounter: Payer: Self-pay | Admitting: Adult Health

## 2013-12-20 ENCOUNTER — Ambulatory Visit (HOSPITAL_BASED_OUTPATIENT_CLINIC_OR_DEPARTMENT_OTHER): Payer: BC Managed Care – PPO | Admitting: Adult Health

## 2013-12-20 ENCOUNTER — Other Ambulatory Visit: Payer: BC Managed Care – PPO

## 2013-12-20 ENCOUNTER — Other Ambulatory Visit: Payer: Self-pay | Admitting: Adult Health

## 2013-12-20 ENCOUNTER — Telehealth: Payer: Self-pay | Admitting: *Deleted

## 2013-12-20 ENCOUNTER — Other Ambulatory Visit (HOSPITAL_BASED_OUTPATIENT_CLINIC_OR_DEPARTMENT_OTHER): Payer: BC Managed Care – PPO

## 2013-12-20 ENCOUNTER — Other Ambulatory Visit: Payer: Self-pay

## 2013-12-20 ENCOUNTER — Ambulatory Visit (HOSPITAL_COMMUNITY)
Admission: RE | Admit: 2013-12-20 | Discharge: 2013-12-20 | Disposition: A | Discharge status: Home / Self Care | Payer: BC Managed Care – PPO | Source: Ambulatory Visit | Attending: Interventional Radiology | Admitting: Interventional Radiology

## 2013-12-20 VITALS — BP 134/96 | HR 113 | Temp 98.0°F | Resp 18 | Ht 66.0 in | Wt 191.9 lb

## 2013-12-20 DIAGNOSIS — Z95828 Presence of other vascular implants and grafts: Secondary | ICD-10-CM

## 2013-12-20 DIAGNOSIS — Z452 Encounter for adjustment and management of vascular access device: Secondary | ICD-10-CM

## 2013-12-20 DIAGNOSIS — R0602 Shortness of breath: Secondary | ICD-10-CM | POA: Insufficient documentation

## 2013-12-20 DIAGNOSIS — R000 Tachycardia, unspecified: Secondary | ICD-10-CM | POA: Diagnosis not present

## 2013-12-20 DIAGNOSIS — C50919 Malignant neoplasm of unspecified site of unspecified female breast: Secondary | ICD-10-CM

## 2013-12-20 DIAGNOSIS — C50412 Malignant neoplasm of upper-outer quadrant of left female breast: Secondary | ICD-10-CM

## 2013-12-20 DIAGNOSIS — Z170 Estrogen receptor positive status [ER+]: Secondary | ICD-10-CM

## 2013-12-20 DIAGNOSIS — E876 Hypokalemia: Secondary | ICD-10-CM

## 2013-12-20 LAB — COMPREHENSIVE METABOLIC PANEL (CC13)
ALBUMIN: 3.2 g/dL — AB (ref 3.5–5.0)
ALT: 16 U/L (ref 0–55)
AST: 14 U/L (ref 5–34)
Alkaline Phosphatase: 55 U/L (ref 40–150)
Anion Gap: 10 mEq/L (ref 3–11)
BUN: 11.3 mg/dL (ref 7.0–26.0)
CALCIUM: 9.3 mg/dL (ref 8.4–10.4)
CHLORIDE: 102 meq/L (ref 98–109)
CO2: 24 mEq/L (ref 22–29)
Creatinine: 0.8 mg/dL (ref 0.6–1.1)
Glucose: 155 mg/dl — ABNORMAL HIGH (ref 70–140)
POTASSIUM: 3.4 meq/L — AB (ref 3.5–5.1)
Sodium: 135 mEq/L — ABNORMAL LOW (ref 136–145)
Total Bilirubin: 0.37 mg/dL (ref 0.20–1.20)
Total Protein: 7.2 g/dL (ref 6.4–8.3)

## 2013-12-20 LAB — CBC WITH DIFFERENTIAL/PLATELET
BASO%: 0.6 % (ref 0.0–2.0)
Basophils Absolute: 0 10*3/uL (ref 0.0–0.1)
EOS%: 0.2 % (ref 0.0–7.0)
Eosinophils Absolute: 0 10*3/uL (ref 0.0–0.5)
HCT: 28 % — ABNORMAL LOW (ref 34.8–46.6)
HGB: 9.1 g/dL — ABNORMAL LOW (ref 11.6–15.9)
LYMPH#: 1.5 10*3/uL (ref 0.9–3.3)
LYMPH%: 21 % (ref 14.0–49.7)
MCH: 27.2 pg (ref 25.1–34.0)
MCHC: 32.5 g/dL (ref 31.5–36.0)
MCV: 83.6 fL (ref 79.5–101.0)
MONO#: 0.5 10*3/uL (ref 0.1–0.9)
MONO%: 7 % (ref 0.0–14.0)
NEUT#: 5 10*3/uL (ref 1.5–6.5)
NEUT%: 71.2 % (ref 38.4–76.8)
Platelets: 363 10*3/uL (ref 145–400)
RBC: 3.34 10*6/uL — ABNORMAL LOW (ref 3.70–5.45)
RDW: 16.3 % — AB (ref 11.2–14.5)
WBC: 7 10*3/uL (ref 3.9–10.3)

## 2013-12-20 LAB — TECHNOLOGIST REVIEW

## 2013-12-20 MED ORDER — IOHEXOL 350 MG/ML SOLN
100.0000 mL | Freq: Once | INTRAVENOUS | Status: AC | PRN
  Administered 2013-12-20: 100 mL via INTRAVENOUS

## 2013-12-20 MED ORDER — HEPARIN SOD (PORK) LOCK FLUSH 100 UNIT/ML IV SOLN
500.0000 [IU] | Freq: Once | INTRAVENOUS | Status: AC
  Administered 2013-12-20: 500 [IU] via INTRAVENOUS
  Filled 2013-12-20: qty 5

## 2013-12-20 MED ORDER — SODIUM CHLORIDE 0.9 % IJ SOLN
10.0000 mL | INTRAMUSCULAR | Status: DC | PRN
  Administered 2013-12-20: 10 mL via INTRAVENOUS
  Filled 2013-12-20: qty 10

## 2013-12-20 NOTE — Progress Notes (Signed)
Called [facility] about cta chest @ [facility] 12/20/13, per Joneen Boers this procedure does not require precert

## 2013-12-20 NOTE — Telephone Encounter (Signed)
perpof to sch pt appt-MW sch pt trmt-pt had CT-gave pt copy of sch

## 2013-12-20 NOTE — Telephone Encounter (Signed)
Per staff message and POF I have scheduled appts. Advised scheduler of appts. JMW  

## 2013-12-20 NOTE — Progress Notes (Signed)
Patient Care Team: Gavin Pound, MD as PCP - General (Family Medicine) [name], MD as Consulting Physician (General Surgery) Thea Silversmith, MD as Consulting Physician (Radiation Oncology) Gavin Pound, MD as Consulting Physician (Family Medicine) Rulon Eisenmenger, MD as Consulting Physician (Hematology and Oncology)  DIAGNOSIS: Breast cancer of upper-outer quadrant of left female breast   Primary site: Breast (Left)   Staging method: AJCC 7th Edition   Clinical: Stage IIA (T2, N0, cM0) signed by Rulon Eisenmenger, MD on 09/20/2013 10:12 AM   Pathologic: (T1c, N0, cM0)   Summary: Stage IIA (T1c, N0, cM0)   Clinical comments: Staged at breast conference 08/17/13.   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  The cancer was grade 2 and ER positive at 100%, PR+ at 91% and HER2- with a Ki67 of 47%   08/12/2013 Breast MRI 2.1 x 1.2 x 1.2 cm biopsy-proven invasive ductal carcinoma and ductal carcinoma in situ in the 9 o'clock position of the left breast   08/30/2013 Surgery Left Lumpectomy IDC with infiltrating lobular features. Pos for LVSI; 1 mm from nearest margin, DCIS 1 SLN Neg; ONCOTYPE Dx Rec score 17, 10 yr risk of recurrance is 11% with Tamoxifen alone   10/04/2013 -  Chemotherapy Weekly Taxol/Herceptin x 12    CHIEF COMPLIANT: Patient is here for week 12 of Taxol Herceptin  INTERVAL HISTORY: Mrs. Borland is a 53 year old American lady with above-mentioned history of HER-2 positive breast cancer. She is doing moderately well.  She has noticed increased shortness of breath over the past couple of weeks.  She is very fatigued.  She is tachycardic.  She does have increased  lower extremity swelling that is improving with compression socks.  She denies fevers or chills.  She denies a sudden onset of dyspnea, or chest pain/discomfort.  She denies any weakness, numbness, or pain.  She denies palpitations.     REVIEW OF SYSTEMS:   A 10 point review of systems was conducted and is otherwise negative except for what is noted above.     Past Medical History  Diagnosis Date  . GERD (gastroesophageal reflux disease)     no current med.  . Dental crown present   . Arthritis     back  . Breast cancer 08/2013    left   Past Surgical History  Procedure Laterality Date  . Foot surgery Right   . Abdominal hysterectomy  12/16/2007  . Lumpectory left  August 30, 2013  . Portacath placement Right 09/26/2013    Procedure: INSERTION PORT-A-CATH;  Surgeon: Shann Medal, MD;  Location: [facility];  Service: General;  Laterality: Right;   Family History  Problem Relation Age of Onset  . Breast cancer Paternal Aunt   . Cervical cancer Paternal Aunt   . Colon cancer Paternal Uncle   . Prostate cancer Paternal Uncle      ALLERGIES:  is allergic to hydrocodone.  MEDICATIONS:  Current Outpatient Prescriptions  Medication Sig Dispense Refill  . cholecalciferol (VITAMIN D) 1000 UNITS tablet Take 1,000 Units by mouth daily.    . Cyanocobalamin (VITAMIN B 12 PO) Take 1 tablet by mouth daily.    Marland Kitchen dexamethasone (DECADRON) 4 MG  tablet Take 1 tablet (4 mg total) by mouth 2 (two) times daily with a meal. Day after chemo for 2 days 30 tablet 1  . gabapentin (NEURONTIN) 300 MG capsule Take 1 capsule (300 mg total) by mouth at bedtime. 30 capsule 0  . lidocaine-prilocaine (EMLA) cream Apply 1 application topically as needed. 30 g 3  . lisinopril-hydrochlorothiazide (PRINZIDE,ZESTORETIC) 10-12.5 MG per tablet TAKE 1 TABLET BY MOUTH DAILY. 30 tablet 0  . LORazepam (ATIVAN) 0.5 MG tablet Take 1 tablet (0.5 mg total) by mouth every 6 (six) hours as needed (as needed for nausea and  vomiting). 30 tablet 0  . Multiple Vitamin (MULTIVITAMIN) tablet Take 1 tablet by mouth daily.    Marland Kitchen omeprazole (PRILOSEC) 40 MG capsule Take 1 capsule (40 mg total) by mouth daily. 30 capsule 3  . ondansetron (ZOFRAN) 8 MG tablet Take 1 tablet (8 mg total) by mouth 2 (two) times daily. 30 tablet 1  . prochlorperazine (COMPAZINE) 10 MG tablet Take 1 tablet (10 mg total) by mouth every 6 (six) hours as needed for nausea or vomiting. 30 tablet 1  . UNABLE TO FIND Cranial Prothesis    . venlafaxine XR (EFFEXOR-XR) 37.5 MG 24 hr capsule TAKE ONE CAPSULE BY MOUTH EVERY DAY WITH BREAKFAST 30 capsule 0  . zolpidem (AMBIEN) 5 MG tablet Take 5 mg by mouth at bedtime as needed for sleep.    . hydrochlorothiazide (HYDRODIURIL) 12.5 MG tablet   1  . HYDROcodone-acetaminophen (NORCO/VICODIN) 5-325 MG per tablet     . ketoconazole (NIZORAL) 2 % cream Apply 1 application topically daily. 15 g 0  . oxyCODONE-acetaminophen (PERCOCET) 10-325 MG per tablet Take 1 tablet by mouth every 4 (four) hours as needed for pain. 30 tablet 0   Current Facility-Administered Medications  Medication Dose Route Frequency Provider Last Rate Last Dose  . sodium chloride 0.9 % injection 10 mL  10 mL Intravenous PRN Minette Headland, NP   10 mL at 12/20/13 1350    PHYSICAL EXAMINATION: ECOG PERFORMANCE STATUS: 1 - Symptomatic but completely ambulatory  Filed Vitals:   12/20/13 1238  BP:   Pulse: 113  Temp:   Resp:    Filed Weights   12/20/13 1111  Weight: 191 lb 14.4 oz (87.045 kg)   GENERAL: Patient is a well appearing female in no acute distress HEENT:  Sclerae anicteric.  Oropharynx clear and moist. No ulcerations or evidence of oropharyngeal candidiasis. Neck is supple.  NODES:  No cervical, supraclavicular, or axillary lymphadenopathy palpated.  BREAST EXAM:  Deferred. LUNGS:  Clear to auscultation bilaterally.  No wheezes or rhonchi. HEART: tachycardic, regular rhythm.  No murmur appreciated. ABDOMEN:  Soft,  nontender.  Positive, normoactive bowel sounds. No organomegaly palpated. MSK:  No focal spinal tenderness to palpation. Full range of motion bilaterally in the upper extremities. EXTREMITIES:  No peripheral edema.   SKIN:  Peeling in axilla bilaterally No nail dyscrasia. NEURO:  Nonfocal. Well oriented.  Appropriate affect.    LABORATORY DATA:  I have reviewed the data as listed   Chemistry      Component Value Date/Time   NA 135* 12/20/2013 1048   NA 140 12/17/2007 0520   K 3.4* 12/20/2013 1048   K 4.4 12/17/2007 0520   CL 105 12/17/2007 0520   CO2 24 12/20/2013 1048   CO2 31 12/17/2007 0520   BUN 11.3 12/20/2013 1048   BUN 5* 12/17/2007 0520   CREATININE 0.8 12/20/2013 1048   CREATININE 0.62 12/17/2007 0520  Component Value Date/Time   CALCIUM 9.3 12/20/2013 1048   CALCIUM 8.7 12/17/2007 0520   ALKPHOS 55 12/20/2013 1048   AST 14 12/20/2013 1048   ALT 16 12/20/2013 1048   BILITOT 0.37 12/20/2013 1048       Lab Results  Component Value Date   WBC 7.0 12/20/2013   HGB 9.1* 12/20/2013   HCT 28.0* 12/20/2013   MCV 83.6 12/20/2013   PLT 363 12/20/2013   NEUTROABS 5.0 12/20/2013     RADIOGRAPHIC STUDIES: I have personally reviewed the radiology reports and agreed with their findings. Ct Angio Chest Pe W/cm &/or Wo Cm  12/20/2013   CLINICAL DATA:  Short of breath.  Tachycardia.  EXAM: CT ANGIOGRAPHY CHEST WITH CONTRAST  TECHNIQUE: Multidetector CT imaging of the chest was performed using the standard protocol during bolus administration of intravenous contrast. Multiplanar CT image reconstructions and MIPs were obtained to evaluate the vascular anatomy.  CONTRAST:  146m OMNIPAQUE IOHEXOL 350 MG/ML SOLN  COMPARISON:  None.  FINDINGS: There are no filling defects in the pulmonary arterial tree to suggest acute pulmonary thromboembolism.  No aortic dissection.  Great vessels are patent.  Right subclavian vein Port-A-Cath tip is at the cavoatrial junction.   Postoperative changes in the medial left breast.  No abnormal mediastinal adenopathy.  No pericardial effusion.  No pneumothorax or pleural effusion.  Mild dependent atelectasis in the lungs.  No acute bony deformity.  Limited upper abdomen is benign.  Review of the MIP images confirms the above findings.  IMPRESSION: No evidence of acute pulmonary thromboembolism.   Electronically Signed   By: AMaryclare BeanM.D.   On: 12/20/2013 13:17     ASSESSMENT:  53year old [place] with   1. 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 Pathology demonstrated heterogeneity in regards to HER-2 expression. Lumpectomy date was on 08/30/13 by Dr. NLucia Gaskins   2. Patient began weekly Paclitaxel and Trastuzumab therapy on 10/04/13. A total of 12 weeks are planned.   3. Radiation therapy to follow chemotherapy.   4. Anti-estrogen therapy to follow radiation therapy.     PLAN:  Emmanuela's shortness of breath that has steadily been increasing with exertion along with her lower extremity swelling is concerning.  I am concerned with her cardiac status, and the possibility that these are side effects from the Herceptin.  I spoke with Dr. MAundra Dubinin cardio-onc.  I reviewed the case with him.  The EKG shows sinus tachycardia with a rate of around 113 and possible LVH.  A CTA was done and was negative for pulmonary embolism.  The patient will not receive Taxol/Herceptin today.  She will undergo echo and eval on Friday morning at the cardio-onc clinic.  I have her coming back to see me on Tuesday, 12/27/13 to discuss their findings and to at minimum receive her last dose of Taxol.  I did not start any medications such as a beta blocker or change any of her antihypertensives after discussion with Dr. MAundra Dubin  I spent a great deal of time discussing this with the patient and she is in agreement.    The patient has a good understanding of the overall plan. she  agrees with it. She will call with any problems that may develop before her next visit here.  I spent 40 minutes counseling the patient face to face. The total time spent in the appointment was 50 minutes and more  than 50% was on counseling and review of test results.  The above case was reviewed with Dr. Lindi Adie.   Minette Headland, [place] [phone] 12/21/2013 8:33 AM

## 2013-12-20 NOTE — Patient Instructions (Signed)

## 2013-12-21 MED ORDER — POTASSIUM CHLORIDE CRYS ER 20 MEQ PO TBCR: 20.0000 meq | EXTENDED_RELEASE_TABLET | Freq: Every day | ORAL | Status: DC

## 2013-12-21 NOTE — Addendum Note (Signed)
Addended by: Minette Headland on: 12/21/2013 10:04 AM   Modules accepted: Orders

## 2013-12-23 ENCOUNTER — Ambulatory Visit (HOSPITAL_COMMUNITY)
Admission: RE | Admit: 2013-12-23 | Discharge: 2013-12-23 | Disposition: A | Discharge status: Home / Self Care | Payer: BC Managed Care – PPO | Source: Ambulatory Visit | Attending: Cardiology | Admitting: Cardiology

## 2013-12-23 ENCOUNTER — Other Ambulatory Visit: Payer: Self-pay

## 2013-12-23 ENCOUNTER — Ambulatory Visit (HOSPITAL_BASED_OUTPATIENT_CLINIC_OR_DEPARTMENT_OTHER)
Admission: RE | Admit: 2013-12-23 | Discharge: 2013-12-23 | Disposition: A | Discharge status: Home / Self Care | Payer: BC Managed Care – PPO | Source: Ambulatory Visit | Attending: Cardiology | Admitting: Cardiology

## 2013-12-23 VITALS — BP 100/76 | HR 110 | Wt 189.2 lb

## 2013-12-23 DIAGNOSIS — R079 Chest pain, unspecified: Secondary | ICD-10-CM

## 2013-12-23 DIAGNOSIS — R0789 Other chest pain: Secondary | ICD-10-CM

## 2013-12-23 DIAGNOSIS — Z90710 Acquired absence of both cervix and uterus: Secondary | ICD-10-CM | POA: Insufficient documentation

## 2013-12-23 DIAGNOSIS — I10 Essential (primary) hypertension: Secondary | ICD-10-CM | POA: Diagnosis not present

## 2013-12-23 DIAGNOSIS — C50412 Malignant neoplasm of upper-outer quadrant of left female breast: Secondary | ICD-10-CM

## 2013-12-23 DIAGNOSIS — Z79899 Other long term (current) drug therapy: Secondary | ICD-10-CM | POA: Insufficient documentation

## 2013-12-23 DIAGNOSIS — R51 Headache: Secondary | ICD-10-CM | POA: Diagnosis present

## 2013-12-23 DIAGNOSIS — C50912 Malignant neoplasm of unspecified site of left female breast: Secondary | ICD-10-CM | POA: Diagnosis not present

## 2013-12-23 DIAGNOSIS — F329 Major depressive disorder, single episode, unspecified: Secondary | ICD-10-CM | POA: Insufficient documentation

## 2013-12-23 DIAGNOSIS — Z8249 Family history of ischemic heart disease and other diseases of the circulatory system: Secondary | ICD-10-CM | POA: Insufficient documentation

## 2013-12-23 DIAGNOSIS — I519 Heart disease, unspecified: Secondary | ICD-10-CM

## 2013-12-23 DIAGNOSIS — Z87891 Personal history of nicotine dependence: Secondary | ICD-10-CM | POA: Insufficient documentation

## 2013-12-23 DIAGNOSIS — Z170 Estrogen receptor positive status [ER+]: Secondary | ICD-10-CM | POA: Diagnosis not present

## 2013-12-23 DIAGNOSIS — R0600 Dyspnea, unspecified: Secondary | ICD-10-CM | POA: Diagnosis present

## 2013-12-23 DIAGNOSIS — R0602 Shortness of breath: Secondary | ICD-10-CM

## 2013-12-23 MED ORDER — CARVEDILOL 3.125 MG PO TABS: 3.1250 mg | ORAL_TABLET | Freq: Two times a day (BID) | ORAL | Status: DC

## 2013-12-23 NOTE — Progress Notes (Signed)
Echocardiogram 2D Echocardiogram has been performed.  Jean [name] 12/23/2013, 8:14 AM

## 2013-12-23 NOTE — Progress Notes (Signed)
Patient ID: Jean [name], female   DOB: [birth year], 53 y.o.   MRN: [id number] Primary Oncologist: Dr. Pamelia Hoit  53 yo presents to cardio-oncology clinic for evaluation in the setting of Herceptin use. Patient had left breast cancer diagnosed in 7/15.  ER+/PR+/HER-2+.  She had left lumpectomy in 7/15 and is undergoing adjuvant chemo with Taxol/Herceptin to be followed by radiation and Herceptin every 3 wks x 1 year.   At last appointment, she seemed to be doing well.  That was a couple of weeks ago.  Since then, she has gradually developed exertional dyspnea.  She has had a prominent headache.  She is short of breath after walking 50 yards or up a flight of steps.  General fatigue with any activtyShe had chest pain one time, yesterday while walking in her bedroom.  She was seen by oncology and had CTA chest with no PE.  ECG today showed mild sinus tachycardia with nonspecific lateral and inferior T wave flattening.  Weight is actually down 5 lbs.    ECG: sinus tachy at 106, nonspecific lateral and inferior T wave flattening.   Labs (11/15): K 3.4, creatinine 0.8, hgb 9.1  PMH: 1. Depression 2. HTN: No meds at this time.  3. H/o hysterectomy 4. Breast cancer: left breast cancer diagnosed in 7/15.  ER+/PR+/HER-2+.  She had left lumpectomy in 7/15 and is undergoing adjuvant chemo with Taxol/Herceptin to be followed by radiation and Herceptin every 3 wks x 1 year.   - Echo (8/15) with EF 55-60%, grade II diastolic dysfunction, GLS -18%, lateral s' 11.4.  - Echo (11/15) with EF 60-65%, grade I diastolic dysfunction, GLS -15.9%, lateral s' 11.9.  - Echo (12/23/13) with EF 60%, grade I diastolic dysfunction, GLS -17.5%, lateral s' 6.6, PA systolic pressure 42 mmHg.   SH: Prior smoker, quit in 2010.  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    FH: Father with MI at 61 and CHF, maternal GF with MI, brother with CABG around age 84.    ROS: All systems reviewed and negative except  as per HPI.   Current Outpatient Prescriptions  Medication Sig Dispense Refill  . cholecalciferol (VITAMIN D) 1000 UNITS tablet Take 1,000 Units by mouth daily.    . Cyanocobalamin (VITAMIN B 12 PO) Take 1 tablet by mouth daily.    Marland Kitchen dexamethasone (DECADRON) 4 MG tablet Take 1 tablet (4 mg total) by mouth 2 (two) times daily with a meal. Day after chemo for 2 days 30 tablet 1  . gabapentin (NEURONTIN) 300 MG capsule Take 1 capsule (300 mg total) by mouth at bedtime. 30 capsule 0  . HYDROcodone-acetaminophen (NORCO/VICODIN) 5-325 MG per tablet     . ketoconazole (NIZORAL) 2 % cream Apply 1 application topically daily. 15 g 0  . lidocaine-prilocaine (EMLA) cream Apply 1 application topically as needed. 30 g 3  . lisinopril-hydrochlorothiazide (PRINZIDE,ZESTORETIC) 10-12.5 MG per tablet TAKE 1 TABLET BY MOUTH DAILY. 30 tablet 0  . LORazepam (ATIVAN) 0.5 MG tablet Take 1 tablet (0.5 mg total) by mouth every 6 (six) hours as needed (as needed for nausea and vomiting). 30 tablet 0  . Multiple Vitamin (MULTIVITAMIN) tablet Take 1 tablet by mouth daily.    Marland Kitchen omeprazole (PRILOSEC) 40 MG capsule Take 1 capsule (40 mg total) by mouth daily. 30 capsule 3  . ondansetron (ZOFRAN) 8 MG tablet Take 1 tablet (8 mg total) by mouth 2 (two) times daily. 30 tablet 1  .  oxyCODONE-acetaminophen (PERCOCET) 10-325 MG per tablet Take 1 tablet by mouth every 4 (four) hours as needed for pain. 30 tablet 0  . potassium chloride SA (K-DUR,KLOR-CON) 20 MEQ tablet Take 1 tablet (20 mEq total) by mouth daily. 5 tablet 0  . prochlorperazine (COMPAZINE) 10 MG tablet Take 1 tablet (10 mg total) by mouth every 6 (six) hours as needed for nausea or vomiting. 30 tablet 1  . UNABLE TO FIND Cranial Prothesis    . venlafaxine XR (EFFEXOR-XR) 37.5 MG 24 hr capsule TAKE ONE CAPSULE BY MOUTH EVERY DAY WITH BREAKFAST 30 capsule 0  . zolpidem (AMBIEN) 5 MG tablet Take 5 mg by mouth at bedtime as needed for sleep.    . carvedilol (COREG)  3.125 MG tablet Take 1 tablet (3.125 mg total) by mouth 2 (two) times daily. 60 tablet 3   No current facility-administered medications for this encounter.    BP 100/76 mmHg  Pulse 110  Wt 189 lb 4 oz (85.843 kg)  SpO2 95%  LMP 11/03/2008 General: NAD Neck: No JVD, no thyromegaly or thyroid nodule.  Lungs: Clear to auscultation bilaterally with normal respiratory effort. CV: Nondisplaced PMI.  Heart mildly tachy, regular S1/S2, no S3/S4, no murmur.  No peripheral edema.  No carotid bruit.  Normal pedal pulses.  Abdomen: Soft, nontender, no hepatosplenomegaly, no distention.  Skin: Intact without lesions or rashes.  Neurologic: Alert and oriented x 3.  Psych: Normal affect. Extremities: No clubbing or cyanosis.  HEENT: Normal.   Assessment/Plan: 1. Breast cancer: Patient will be getting Herceptin for 1 year.  Over the last couple of weeks, she has developed significant exertional dyspnea and had one episode of chest pain.  She is not volume overloaded on exam and weight is down.  ECG shows nonspecific T wave flattening.  Echo showed stable GLS and EF, but lateral s' was significantly lower.  TR doppler jet suggested mild pulmonary hypertension.  CTA chest negative for PE.  - I think it would be best to hold Herceptin for 6 weeks, will then repeat echo.  - Continue lisinopril, start Coreg 3.125 mg bid.  2. Chest pain: Patient has family history of premature CAD.  She had an episode of chest pain yesterday and has had exertional dyspnea.  I will arrange for ETT-Cardiolite for risk stratification.   RTC in 2 wks.   [name] 12/23/2013

## 2013-12-23 NOTE — Patient Instructions (Signed)
Start Carvedilol 3.125 mg Twice daily   Your physician has requested that you have en exercise stress myoview. For further information please visit [email or web address]. Please follow instruction sheet, as given.  Your physician recommends that you schedule a follow-up appointment in: 2 weeks

## 2013-12-24 NOTE — Addendum Note (Signed)
Encounter addended by: Georga Kaufmann, CCT on: 12/24/2013 10:18 AM<BR>     Documentation filed: Charges VN

## 2013-12-26 ENCOUNTER — Ambulatory Visit (HOSPITAL_COMMUNITY): Payer: BC Managed Care – PPO | Attending: Cardiology | Admitting: Radiology

## 2013-12-26 DIAGNOSIS — R9431 Abnormal electrocardiogram [ECG] [EKG]: Secondary | ICD-10-CM | POA: Insufficient documentation

## 2013-12-26 DIAGNOSIS — R079 Chest pain, unspecified: Secondary | ICD-10-CM | POA: Diagnosis not present

## 2013-12-26 DIAGNOSIS — I10 Essential (primary) hypertension: Secondary | ICD-10-CM | POA: Insufficient documentation

## 2013-12-26 DIAGNOSIS — R0609 Other forms of dyspnea: Secondary | ICD-10-CM | POA: Insufficient documentation

## 2013-12-26 DIAGNOSIS — R5383 Other fatigue: Secondary | ICD-10-CM | POA: Diagnosis not present

## 2013-12-26 DIAGNOSIS — R0602 Shortness of breath: Secondary | ICD-10-CM

## 2013-12-26 MED ORDER — TECHNETIUM TC 99M SESTAMIBI GENERIC - CARDIOLITE
30.0000 | Freq: Once | INTRAVENOUS | Status: AC | PRN
  Administered 2013-12-26: 30 via INTRAVENOUS

## 2013-12-26 MED ORDER — TECHNETIUM TC 99M SESTAMIBI GENERIC - CARDIOLITE
10.0000 | Freq: Once | INTRAVENOUS | Status: AC | PRN
  Administered 2013-12-26: 10 via INTRAVENOUS

## 2013-12-26 NOTE — Progress Notes (Addendum)
[facility] [address] [place], [place] [address] [phone]    Cardiology Nuclear Med Study  Jean [name] is a 53 y.o. female     MRN : [id number]     DOB: 08/01/1960  Procedure Date: 12/26/2013  Nuclear Med Background Indication for Stress Test:  Evaluation for Ischemia and Abnormal EKG History:  Breast CA with Chemo Cardiac Risk Factors: Hypertension  Symptoms:  Chest Pain, DOE and Fatigue   Nuclear Pre-Procedure Caffeine/Decaff Intake:  None> 12 hrs NPO After: 10:00pm   Lungs:  clear O2 Sat: 97% on room air. IV 0.9% NS with Angio Cath:  22g  IV Site: R Antecubital x 1, tolerated well IV Started by:  Irven Baltimore, RN  Chest Size (in):  40 Cup Size: D  Height: 5\' 6"  (1.676 m)  Weight:  188 lb (85.276 kg)  BMI:  Body mass index is 30.36 kg/(m^2). Tech Comments:  Patient took Coreg at 6:00pm yesterday, and took Lisinopril-HCT this am. Irven Baltimore, RN.    Nuclear Med Study 1 or 2 day study: 1 day  Stress Test Type:  Stress  Reading MD: N/A  Order Authorizing Provider:  [name], MD  Resting Radionuclide: Technetium 85m Sestamibi  Resting Radionuclide Dose: 11.0 mCi   Stress Radionuclide:  Technetium 40m Sestamibi  Stress Radionuclide Dose: 33.0 mCi           Stress Protocol Rest HR: 89 Stress HR: 157  Rest BP: 95/49 Stress BP: 165/100  Exercise Time (min): 3:00 METS: 4.60   Predicted Max HR: 168 bpm % Max HR: 93.45 bpm Rate Pressure Product: 25905   Dose of Adenosine (mg):  n/a Dose of Lexiscan: n/a mg  Dose of Atropine (mg): n/a Dose of Dobutamine: n/a mcg/kg/min (at max HR)  Stress Test Technologist: Perrin Maltese, EMT-P  Nuclear Technologist:  Vedia Pereyra, CNMT     Rest Procedure:  Myocardial perfusion imaging was performed at rest 45 minutes following the intravenous administration of Technetium 70m Sestamibi. Rest ECG: Normal sinus rhythm. Nonspecific ST-T wave changes.  Stress Procedure:  The patient  exercised on the treadmill utilizing the Bruce Protocol for 3:00 minutes. The patient stopped due to sob, headache, and denied any chest pain.  Technetium 21m Sestamibi was injected at peak exercise and myocardial perfusion imaging was performed after a brief delay. Stress ECG: No significant change from rest. No evidence of ischemia  QPS Raw Data Images:  Normal; no motion artifact; normal heart/lung ratio. Stress Images:  There is a small area of mild decreased activity in the area of the anterior septum consistent with breast attenuation. This is fixed. There is a small area of decreased activity at the base of the inferolateral wall. This is fixed. Rest Images:  Resting images of the same as the stress images. Subtraction (SDS):  No evidence of ischemia. Transient Ischemic Dilatation (Normal <1.22):  1.00 Lung/Heart Ratio (Normal <0.45):  0.31  Quantitative Gated Spect Images QGS EDV:  67 ml QGS ESV:  25 ml  Impression Exercise Capacity:  Poor exercise capacity. The patient has a significant increase in heart rate as soon as she begins to walk. This increases rapidly. O2 sat dropped to 92%. BP Response:  Normal blood pressure response. Clinical Symptoms:  Shortness of breath ECG Impression:  No significant ST segment change suggestive of ischemia. Comparison with Prior Nuclear Study: No images to compare  Overall Impression:  There is poor exercise tolerance. There is a drop in her  O2 sat. There is no definite abnormality on the nuclear images. There are some areas of decreased uptake that I believe are not significant. There is no definite scar or ischemia. This is a low risk scan.   LV Ejection Fraction:62% LV Wall Motion:  Normal Wall Motion.  Dola Argyle, MD  No evidence for ischemia or infarction but oxygen saturation dropped to 92% with exercise, very poor exercise tolerance.  Please report to patient.  Will need to see back in the office.   [name] 12/27/2013

## 2013-12-27 ENCOUNTER — Other Ambulatory Visit: Payer: Self-pay | Admitting: *Deleted

## 2013-12-27 ENCOUNTER — Telehealth: Payer: Self-pay | Admitting: Hematology and Oncology

## 2013-12-27 ENCOUNTER — Encounter: Payer: Self-pay | Admitting: Adult Health

## 2013-12-27 ENCOUNTER — Ambulatory Visit (HOSPITAL_BASED_OUTPATIENT_CLINIC_OR_DEPARTMENT_OTHER): Payer: BC Managed Care – PPO

## 2013-12-27 ENCOUNTER — Ambulatory Visit (HOSPITAL_BASED_OUTPATIENT_CLINIC_OR_DEPARTMENT_OTHER): Payer: BC Managed Care – PPO | Admitting: Adult Health

## 2013-12-27 ENCOUNTER — Other Ambulatory Visit (HOSPITAL_BASED_OUTPATIENT_CLINIC_OR_DEPARTMENT_OTHER): Payer: BC Managed Care – PPO

## 2013-12-27 VITALS — BP 99/76 | HR 93 | Temp 98.1°F | Resp 18 | Ht 66.0 in | Wt 190.6 lb

## 2013-12-27 DIAGNOSIS — C50812 Malignant neoplasm of overlapping sites of left female breast: Secondary | ICD-10-CM

## 2013-12-27 DIAGNOSIS — C50412 Malignant neoplasm of upper-outer quadrant of left female breast: Secondary | ICD-10-CM

## 2013-12-27 DIAGNOSIS — G629 Polyneuropathy, unspecified: Secondary | ICD-10-CM

## 2013-12-27 DIAGNOSIS — R0609 Other forms of dyspnea: Secondary | ICD-10-CM

## 2013-12-27 DIAGNOSIS — D6481 Anemia due to antineoplastic chemotherapy: Secondary | ICD-10-CM

## 2013-12-27 DIAGNOSIS — Z5111 Encounter for antineoplastic chemotherapy: Secondary | ICD-10-CM

## 2013-12-27 LAB — CBC WITH DIFFERENTIAL/PLATELET
BASO%: 0.3 % (ref 0.0–2.0)
Basophils Absolute: 0 10*3/uL (ref 0.0–0.1)
EOS ABS: 0 10*3/uL (ref 0.0–0.5)
EOS%: 0.6 % (ref 0.0–7.0)
HCT: 26.7 % — ABNORMAL LOW (ref 34.8–46.6)
HGB: 8.5 g/dL — ABNORMAL LOW (ref 11.6–15.9)
LYMPH%: 25.2 % (ref 14.0–49.7)
MCH: 26.7 pg (ref 25.1–34.0)
MCHC: 32 g/dL (ref 31.5–36.0)
MCV: 83.4 fL (ref 79.5–101.0)
MONO#: 0.7 10*3/uL (ref 0.1–0.9)
MONO%: 10 % (ref 0.0–14.0)
NEUT%: 63.9 % (ref 38.4–76.8)
NEUTROS ABS: 4.4 10*3/uL (ref 1.5–6.5)
Platelets: 430 10*3/uL — ABNORMAL HIGH (ref 145–400)
RBC: 3.2 10*6/uL — AB (ref 3.70–5.45)
RDW: 17.2 % — AB (ref 11.2–14.5)
WBC: 6.9 10*3/uL (ref 3.9–10.3)
lymph#: 1.7 10*3/uL (ref 0.9–3.3)

## 2013-12-27 LAB — COMPREHENSIVE METABOLIC PANEL (CC13)
ALBUMIN: 3.1 g/dL — AB (ref 3.5–5.0)
ALK PHOS: 72 U/L (ref 40–150)
ALT: 19 U/L (ref 0–55)
ANION GAP: 11 meq/L (ref 3–11)
AST: 17 U/L (ref 5–34)
BUN: 13.3 mg/dL (ref 7.0–26.0)
CO2: 27 meq/L (ref 22–29)
Calcium: 9.7 mg/dL (ref 8.4–10.4)
Chloride: 100 mEq/L (ref 98–109)
Creatinine: 1 mg/dL (ref 0.6–1.1)
GLUCOSE: 132 mg/dL (ref 70–140)
POTASSIUM: 3.8 meq/L (ref 3.5–5.1)
Sodium: 138 mEq/L (ref 136–145)
Total Bilirubin: 0.26 mg/dL (ref 0.20–1.20)
Total Protein: 7.2 g/dL (ref 6.4–8.3)

## 2013-12-27 MED ORDER — DIPHENHYDRAMINE HCL 50 MG/ML IJ SOLN
INTRAMUSCULAR | Status: AC
  Filled 2013-12-27: qty 1

## 2013-12-27 MED ORDER — DEXAMETHASONE SODIUM PHOSPHATE 20 MG/5ML IJ SOLN
INTRAMUSCULAR | Status: AC
  Filled 2013-12-27: qty 5

## 2013-12-27 MED ORDER — ONDANSETRON 8 MG/NS 50 ML IVPB
INTRAVENOUS | Status: AC
  Filled 2013-12-27: qty 8

## 2013-12-27 MED ORDER — FAMOTIDINE IN NACL 20-0.9 MG/50ML-% IV SOLN
INTRAVENOUS | Status: AC
Start: 2013-12-27 — End: 2013-12-27
  Filled 2013-12-27: qty 50

## 2013-12-27 MED ORDER — SODIUM CHLORIDE 0.9 % IV SOLN
Freq: Once | INTRAVENOUS | Status: AC
  Administered 2013-12-27: 12:00:00 via INTRAVENOUS

## 2013-12-27 MED ORDER — SODIUM CHLORIDE 0.9 % IJ SOLN
10.0000 mL | INTRAMUSCULAR | Status: DC | PRN
  Administered 2013-12-27: 10 mL
  Filled 2013-12-27: qty 10

## 2013-12-27 MED ORDER — PACLITAXEL CHEMO INJECTION 300 MG/50ML
80.0000 mg/m2 | Freq: Once | INTRAVENOUS | Status: AC
  Administered 2013-12-27: 162 mg via INTRAVENOUS
  Filled 2013-12-27: qty 27

## 2013-12-27 MED ORDER — DEXAMETHASONE SODIUM PHOSPHATE 20 MG/5ML IJ SOLN
20.0000 mg | Freq: Once | INTRAMUSCULAR | Status: AC
  Administered 2013-12-27: 20 mg via INTRAVENOUS

## 2013-12-27 MED ORDER — FAMOTIDINE IN NACL 20-0.9 MG/50ML-% IV SOLN
20.0000 mg | Freq: Once | INTRAVENOUS | Status: AC
  Administered 2013-12-27: 20 mg via INTRAVENOUS

## 2013-12-27 MED ORDER — ACETAMINOPHEN 325 MG PO TABS
650.0000 mg | ORAL_TABLET | Freq: Once | ORAL | Status: AC
  Administered 2013-12-27: 650 mg via ORAL

## 2013-12-27 MED ORDER — ONDANSETRON 8 MG/50ML IVPB (CHCC)
8.0000 mg | Freq: Once | INTRAVENOUS | Status: AC
  Administered 2013-12-27: 8 mg via INTRAVENOUS

## 2013-12-27 MED ORDER — HEPARIN SOD (PORK) LOCK FLUSH 100 UNIT/ML IV SOLN
500.0000 [IU] | Freq: Once | INTRAVENOUS | Status: AC | PRN
  Administered 2013-12-27: 500 [IU]
  Filled 2013-12-27: qty 5

## 2013-12-27 MED ORDER — DIPHENHYDRAMINE HCL 50 MG/ML IJ SOLN
50.0000 mg | Freq: Once | INTRAMUSCULAR | Status: AC
  Administered 2013-12-27: 50 mg via INTRAVENOUS

## 2013-12-27 MED ORDER — ACETAMINOPHEN 325 MG PO TABS
ORAL_TABLET | ORAL | Status: AC
  Filled 2013-12-27: qty 2

## 2013-12-27 NOTE — Patient Instructions (Signed)
[facility] Discharge Instructions for Patients Receiving Chemotherapy  Today you received the following chemotherapy agents Taxol  To help prevent nausea and vomiting after your treatment, we encourage you to take your nausea medication    If you develop nausea and vomiting that is not controlled by your nausea medication, call the clinic.   BELOW ARE SYMPTOMS THAT SHOULD BE REPORTED IMMEDIATELY:  *FEVER GREATER THAN 100.5 F  *CHILLS WITH OR WITHOUT FEVER  NAUSEA AND VOMITING THAT IS NOT CONTROLLED WITH YOUR NAUSEA MEDICATION  *UNUSUAL SHORTNESS OF BREATH  *UNUSUAL BRUISING OR BLEEDING  TENDERNESS IN MOUTH AND THROAT WITH OR WITHOUT PRESENCE OF ULCERS  *URINARY PROBLEMS  *BOWEL PROBLEMS  UNUSUAL RASH Items with * indicate a potential emergency and should be followed up as soon as possible.  Feel free to call the clinic you have any questions or concerns. The clinic phone number is [phone].    

## 2013-12-27 NOTE — Progress Notes (Signed)
Patient Care Team: Gavin Pound, MD as PCP - General (Family Medicine) Jean Overall, MD as Consulting Physician (General Surgery) Thea Silversmith, MD as Consulting Physician (Radiation Oncology) Gavin Pound, MD as Consulting Physician (Family Medicine) Rulon Eisenmenger, MD as Consulting Physician (Hematology and Oncology)  DIAGNOSIS: Breast cancer of upper-outer quadrant of left female breast   Primary site: Breast (Left)   Staging method: AJCC 7th Edition   Clinical: Stage IIA (T2, N0, cM0) signed by Rulon Eisenmenger, MD on 09/20/2013 10:12 AM   Pathologic: (T1c, N0, cM0)   Summary: Stage IIA (T1c, N0, cM0)   Clinical comments: Staged at breast conference 08/17/13.   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  The cancer was grade 2 and ER positive at 100%, PR+ at 91% and HER2- with a Ki67 of 47%   08/12/2013 Breast MRI 2.1 x 1.2 x 1.2 cm biopsy-proven invasive ductal carcinoma and ductal carcinoma in situ in the 9 o'clock position of the left breast   08/30/2013 Surgery Left Lumpectomy IDC with infiltrating lobular features. Pos for LVSI; 1 mm from nearest margin, DCIS 1 SLN Neg; ONCOTYPE Dx Rec score 17, 10 yr risk of recurrance is 11% with Tamoxifen alone   10/04/2013 -  Chemotherapy Weekly Taxol/Herceptin x 12    CHIEF COMPLIANT: Patient is here for week 12 of Taxol Herceptin  INTERVAL HISTORY: Jean [name] is a 53 year old American lady with above-mentioned history of HER-2 positive breast cancer. She is here for her twelfth cycle of Taxol.  Her treatment was held last week due to shortness of breath and she was evaluated by Dr. Aundra Dubin.  He recommended holding Herceptin x 6  weeks.  She also underwent a stress test and is awaiting the results.  She has mild numbness in the bottom of her feet that is intermittent. She has had a headache for a couple of days and it improved with Tylenol.  She denies fevers, chills, nausea, vomiting, constpation, diarrhea, or any further concerns.     REVIEW OF SYSTEMS:   A 10 point review of systems was conducted and is otherwise negative except for what is noted above.     Past Medical History  Diagnosis Date  . GERD (gastroesophageal reflux disease)     no current med.  . Dental crown present   . Arthritis     back  . Breast cancer 08/2013    left   Past Surgical History  Procedure Laterality Date  . Foot surgery Right   . Abdominal hysterectomy  12/16/2007  . Lumpectory left  August 30, 2013  . Portacath placement Right 09/26/2013    Procedure: INSERTION PORT-A-CATH;  Surgeon: Shann Medal, MD;  Location: [facility];  Service: General;  Laterality: Right;   Family History  Problem Relation Age of Onset  . Breast cancer Paternal Aunt   . Cervical cancer Paternal Aunt   . Colon cancer Paternal Uncle   . Prostate cancer Paternal Uncle      ALLERGIES:  is allergic to hydrocodone.  MEDICATIONS:  Current Outpatient Prescriptions  Medication Sig Dispense Refill  . carvedilol (COREG) 3.125 MG tablet Take 1 tablet (3.125 mg total) by mouth 2 (two) times  daily. 60 tablet 3  . cholecalciferol (VITAMIN D) 1000 UNITS tablet Take 1,000 Units by mouth daily.    . Cyanocobalamin (VITAMIN B 12 PO) Take 1 tablet by mouth daily.    Marland Kitchen dexamethasone (DECADRON) 4 MG tablet Take 1 tablet (4 mg total) by mouth 2 (two) times daily with a meal. Day after chemo for 2 days 30 tablet 1  . ketoconazole (NIZORAL) 2 % cream Apply 1 application topically daily. 15 g 0  . lidocaine-prilocaine (EMLA) cream Apply 1 application topically as needed. 30 g 3  . lisinopril-hydrochlorothiazide (PRINZIDE,ZESTORETIC) 10-12.5 MG per tablet TAKE 1 TABLET BY  MOUTH DAILY. 30 tablet 0  . LORazepam (ATIVAN) 0.5 MG tablet Take 1 tablet (0.5 mg total) by mouth every 6 (six) hours as needed (as needed for nausea and vomiting). 30 tablet 0  . Multiple Vitamin (MULTIVITAMIN) tablet Take 1 tablet by mouth daily.    Marland Kitchen omeprazole (PRILOSEC) 40 MG capsule Take 1 capsule (40 mg total) by mouth daily. 30 capsule 3  . potassium chloride SA (K-DUR,KLOR-CON) 20 MEQ tablet Take 1 tablet (20 mEq total) by mouth daily. 5 tablet 0  . UNABLE TO FIND Cranial Prothesis    . venlafaxine XR (EFFEXOR-XR) 37.5 MG 24 hr capsule TAKE ONE CAPSULE BY MOUTH EVERY DAY WITH BREAKFAST 30 capsule 0  . zolpidem (AMBIEN) 5 MG tablet Take 5 mg by mouth at bedtime as needed for sleep.    Marland Kitchen gabapentin (NEURONTIN) 300 MG capsule Take 1 capsule (300 mg total) by mouth at bedtime. (Patient not taking: Reported on 12/27/2013) 30 capsule 0  . HYDROcodone-acetaminophen (NORCO/VICODIN) 5-325 MG per tablet     . ondansetron (ZOFRAN) 8 MG tablet Take 1 tablet (8 mg total) by mouth 2 (two) times daily. (Patient not taking: Reported on 12/27/2013) 30 tablet 1  . oxyCODONE-acetaminophen (PERCOCET) 10-325 MG per tablet Take 1 tablet by mouth every 4 (four) hours as needed for pain. (Patient not taking: Reported on 12/27/2013) 30 tablet 0  . prochlorperazine (COMPAZINE) 10 MG tablet Take 1 tablet (10 mg total) by mouth every 6 (six) hours as needed for nausea or vomiting. (Patient not taking: Reported on 12/27/2013) 30 tablet 1   No current facility-administered medications for this visit.    PHYSICAL EXAMINATION: ECOG PERFORMANCE STATUS: 2  Filed Vitals:   12/27/13 1125  BP: 99/76  Pulse: 93  Temp: 98.1 F (36.7 C)  Resp: 18   Filed Weights   12/27/13 1125  Weight: 190 lb 9.6 oz (86.456 kg)   GENERAL: Patient is a well appearing female in no acute distress HEENT:  Sclerae anicteric.  Oropharynx clear and moist. No ulcerations or evidence of oropharyngeal candidiasis. Neck is supple.  NODES:   No cervical, supraclavicular, or axillary lymphadenopathy palpated.  BREAST EXAM:  Deferred. LUNGS:  Clear to auscultation bilaterally.  No wheezes or rhonchi. HEART: tachycardic, regular rhythm.  No murmur appreciated. ABDOMEN:  Soft, nontender.  Positive, normoactive bowel sounds. No organomegaly palpated. MSK:  No focal spinal tenderness to palpation. Full range of motion bilaterally in the upper extremities. EXTREMITIES:  No peripheral edema.   SKIN:  Peeling in axilla bilaterally No nail dyscrasia. NEURO:  Nonfocal. Well oriented.  Appropriate affect.    LABORATORY DATA:  I have reviewed the data as listed   Chemistry      Component Value Date/Time   NA 135* 12/20/2013 1048   NA 140 12/17/2007 0520   K 3.4* 12/20/2013 1048   K 4.4 12/17/2007  0520   CL 105 12/17/2007 0520   CO2 24 12/20/2013 1048   CO2 31 12/17/2007 0520   BUN 11.3 12/20/2013 1048   BUN 5* 12/17/2007 0520   CREATININE 0.8 12/20/2013 1048   CREATININE 0.62 12/17/2007 0520      Component Value Date/Time   CALCIUM 9.3 12/20/2013 1048   CALCIUM 8.7 12/17/2007 0520   ALKPHOS 55 12/20/2013 1048   AST 14 12/20/2013 1048   ALT 16 12/20/2013 1048   BILITOT 0.37 12/20/2013 1048       Lab Results  Component Value Date   WBC 6.9 12/27/2013   HGB 8.5* 12/27/2013   HCT 26.7* 12/27/2013   MCV 83.4 12/27/2013   PLT 430* 12/27/2013   NEUTROABS 4.4 12/27/2013     RADIOGRAPHIC STUDIES: I have personally reviewed the radiology reports and agreed with their findings. No results found.   ASSESSMENT:  53 year old [place], [place] woman with   1. 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 Pathology demonstrated heterogeneity in regards to HER-2 expression. Lumpectomy date was on 08/30/13 by Dr. Lucia Gaskins.   2. Patient began weekly Paclitaxel and Trastuzumab therapy on 10/04/13. A total of 12 weeks are planned.   3. Radiation therapy to follow  chemotherapy.   4. Anti-estrogen therapy to follow radiation therapy.    PLAN:  Jean [name] is doing well today.  Her lab work is stable.  She is slightly anemic due to chemotherapy.  She will proceed with chemotherapy today with Taxol alone.  She continues to not work due to chemotherapy and its side effects.  I think that this is a good idea since her job is so physically demanding.  She has exertional dyspnea that has increased due to cardiac changes from the Herceptin.  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  She will continue to take Tylenol for her headaches.    I have referred Jean [name] to radiation oncology for evaluation.  She will return in one week for labs only to evaluate her anemia.  She will see Dr. Lindi Adie on 01/12/14 for labs, evaluation and Herceptin treatment.   The patient has a good understanding of the [name] plan. she agrees with it. She will call with any problems that may develop before her next visit here.  I spent 25 minutes counseling the patient face to face. The total time spent in the appointment was 30 minutes and more than 50% was on counseling and review of test results.  The above case was reviewed with Dr. Lindi Adie.   Jean [name], [place] [phone] 12/27/2013 11:43 AM

## 2013-12-27 NOTE — Telephone Encounter (Signed)
, °

## 2013-12-28 ENCOUNTER — Telehealth: Payer: Self-pay | Admitting: *Deleted

## 2013-12-28 NOTE — Telephone Encounter (Signed)
Per staff message and POF I have scheduled appts. Advised scheduler of appts. JMW  

## 2013-12-28 NOTE — Progress Notes (Signed)
Pt aware, she is sch for f/u 12/8

## 2014-01-02 ENCOUNTER — Telehealth: Payer: Self-pay

## 2014-01-02 NOTE — Telephone Encounter (Signed)
Faxed office notes dtd 12/27/13 to Latricia Heft. At [facility] in response to his request for updated office notes.  Sent to scan.

## 2014-01-02 NOTE — Telephone Encounter (Signed)
Charting error.

## 2014-01-05 DIAGNOSIS — C50412 Malignant neoplasm of upper-outer quadrant of left female breast: Secondary | ICD-10-CM

## 2014-01-05 NOTE — Progress Notes (Deleted)
Rulon Eisenmenger, MD  Prentiss Bells, RN           Yes she will need Rad Onc consult  thx       Previous Messages     ----- Message -----   From: Prentiss Bells, RN   Sent: 12/30/2013  2:06 PM    To: Rulon Eisenmenger, MD  Subject: Last chemo                    Her last chemo was this week. Does she need scans? Does she need radonc referral?   Jean [name]

## 2014-01-09 NOTE — Progress Notes (Signed)
Location of Breast Cancer:left upper-outer quadrant.1.8 UJ:[id number] ductal carcinoma/DCIS  Histology per Pathology Report: 08/30/13 Diagnosis 1. Breast, lumpectomy, left with radioactive seed - INVASIVE DUCTAL CARCINOMA WITH INFILTRATING LOBULAR FEATURES, SEE COMMENT. - POSITIVE FOR LYMPH VASCULAR INVASION - INVASIVE TUMOR IS 1 MM FROM NEAREST MARGIN (INFERIOR). - DUCTAL CARCINOMA IN SITU WITH NECROSIS - IN SITU CARCINOMA IS 6 MM FROM NEAREST MARGIN (POSTERIOR). - SEE TUMOR SYNOPTIC TEMPLATE BELOW. 2. Lymph node, sentinel, biopsy, left axillary #1 - ONE LYMPH NODE, NEGATIVE FOR TUMOR (0/1). SEE COMMENT. 08/09/13 Diagnosis Breast, left, needle core biopsy, mass, 9 o'clock - INVASIVE DUCTAL CARCINOMA, SEE COMMENT. - DUCTAL CARCINOMA IN SITU. - POSITIVE FOR LYMPH VASCULAR INVASION.  Receptor Status: ER(+), PR (+), Her2-neu (-)  Did patient present with symptoms (if so, please note symptoms) or was this found on screening mammography?:diagnostic bilateral  mammaogram on 08/01/13.diagnostic left breast ultrasound performed on 08/09/13 Bilateral screening mammogram 07/22/13. Breast MRI 08/12/13.  Past/Anticipated interventions by surgeon, if any:08/30/13 RADIOACTIVE SEED GUIDED PARTIAL MASTECTOMY WITH AXILLARY SENTINEL LYMPH NODE BIOPSY by Paralee Cancel.  Past/Anticipated interventions by medical oncology, if any: Chemotherapy;Weekly taxol/herceptin x 12 started 10/04/13.herceptin on hold x 6 weeks due to shortness of breath.Anti-estrogen therapy to start after radiation. Last chemo on 12/25/13.  Lymphedema issues, if any:No   Pain issues, if any:No    SAFETY ISSUES:  Prior radiation? No  Pacemaker/ICD?No  Possible current pregnancy?No hysterectomy  Is the patient on methotrexate? No  Current Complaints / other details:Single.Employed at [employer].Menses age 63.Last menstrual cycle in 2010.No hormonal replacement therapy.GXP2. First live birth age 30.  Intolerant to hydrocodone, causes  constipation.    Arlyss Repress, RN 01/09/2014,9:23 AM

## 2014-01-10 ENCOUNTER — Ambulatory Visit
Admission: RE | Admit: 2014-01-10 | Discharge: 2014-01-10 | Disposition: A | Discharge status: Home / Self Care | Payer: BLUE CROSS/BLUE SHIELD | Source: Ambulatory Visit | Attending: Radiation Oncology | Admitting: Radiation Oncology

## 2014-01-10 ENCOUNTER — Ambulatory Visit (HOSPITAL_COMMUNITY)
Admission: RE | Admit: 2014-01-10 | Discharge: 2014-01-10 | Disposition: A | Discharge status: Home / Self Care | Payer: BC Managed Care – PPO | Source: Ambulatory Visit | Attending: Internal Medicine | Admitting: Internal Medicine

## 2014-01-10 ENCOUNTER — Encounter: Payer: Self-pay | Admitting: Radiation Oncology

## 2014-01-10 VITALS — BP 100/82 | HR 110 | Wt 195.0 lb

## 2014-01-10 VITALS — BP 107/68 | HR 82 | Temp 98.5°F | Resp 16 | Ht 66.0 in | Wt 196.0 lb

## 2014-01-10 DIAGNOSIS — Z79899 Other long term (current) drug therapy: Secondary | ICD-10-CM | POA: Diagnosis not present

## 2014-01-10 DIAGNOSIS — R0683 Snoring: Secondary | ICD-10-CM | POA: Insufficient documentation

## 2014-01-10 DIAGNOSIS — R079 Chest pain, unspecified: Secondary | ICD-10-CM | POA: Insufficient documentation

## 2014-01-10 DIAGNOSIS — D649 Anemia, unspecified: Secondary | ICD-10-CM | POA: Diagnosis not present

## 2014-01-10 DIAGNOSIS — Z87891 Personal history of nicotine dependence: Secondary | ICD-10-CM | POA: Insufficient documentation

## 2014-01-10 DIAGNOSIS — F329 Major depressive disorder, single episode, unspecified: Secondary | ICD-10-CM | POA: Insufficient documentation

## 2014-01-10 DIAGNOSIS — C50412 Malignant neoplasm of upper-outer quadrant of left female breast: Secondary | ICD-10-CM | POA: Diagnosis not present

## 2014-01-10 DIAGNOSIS — R0600 Dyspnea, unspecified: Secondary | ICD-10-CM | POA: Insufficient documentation

## 2014-01-10 DIAGNOSIS — I10 Essential (primary) hypertension: Secondary | ICD-10-CM | POA: Insufficient documentation

## 2014-01-10 DIAGNOSIS — C50912 Malignant neoplasm of unspecified site of left female breast: Secondary | ICD-10-CM | POA: Insufficient documentation

## 2014-01-10 DIAGNOSIS — R5383 Other fatigue: Secondary | ICD-10-CM | POA: Insufficient documentation

## 2014-01-10 DIAGNOSIS — Z79891 Long term (current) use of opiate analgesic: Secondary | ICD-10-CM | POA: Insufficient documentation

## 2014-01-10 DIAGNOSIS — D508 Other iron deficiency anemias: Secondary | ICD-10-CM

## 2014-01-10 LAB — RETICULOCYTES
RBC.: 3.31 MIL/uL — ABNORMAL LOW (ref 3.87–5.11)
RETIC COUNT ABSOLUTE: 145.6 10*3/uL (ref 19.0–186.0)
Retic Ct Pct: 4.4 % — ABNORMAL HIGH (ref 0.4–3.1)

## 2014-01-10 LAB — CBC
HCT: 28.5 % — ABNORMAL LOW (ref 36.0–46.0)
HEMOGLOBIN: 8.9 g/dL — AB (ref 12.0–15.0)
MCH: 27 pg (ref 26.0–34.0)
MCHC: 31.2 g/dL (ref 30.0–36.0)
MCV: 86.4 fL (ref 78.0–100.0)
PLATELETS: 442 10*3/uL — AB (ref 150–400)
RBC: 3.3 MIL/uL — AB (ref 3.87–5.11)
RDW: 17.8 % — ABNORMAL HIGH (ref 11.5–15.5)
WBC: 5.8 10*3/uL (ref 4.0–10.5)

## 2014-01-10 LAB — COMPREHENSIVE METABOLIC PANEL
ALT: 18 U/L (ref 0–35)
AST: 19 U/L (ref 0–37)
Albumin: 3.7 g/dL (ref 3.5–5.2)
Alkaline Phosphatase: 73 U/L (ref 39–117)
Anion gap: 13 (ref 5–15)
BUN: 12 mg/dL (ref 6–23)
CO2: 28 mEq/L (ref 19–32)
CREATININE: 0.8 mg/dL (ref 0.50–1.10)
Calcium: 10 mg/dL (ref 8.4–10.5)
Chloride: 101 mEq/L (ref 96–112)
GFR calc Af Amer: >90 mL/min (ref >90)
GFR calc non Af Amer: 83 mL/min — ABNORMAL LOW (ref >90)
GLUCOSE: 99 mg/dL (ref 70–99)
Potassium: 3.8 mEq/L (ref 3.7–5.3)
SODIUM: 142 meq/L (ref 137–147)
TOTAL PROTEIN: 7.5 g/dL (ref 6.0–8.3)
Total Bilirubin: <0.2 mg/dL — ABNORMAL LOW (ref 0.3–1.2)

## 2014-01-10 LAB — VITAMIN B12: Vitamin B-12: 922 pg/mL — ABNORMAL HIGH (ref 211–911)

## 2014-01-10 LAB — FERRITIN: FERRITIN: 433 ng/mL — AB (ref 10–291)

## 2014-01-10 LAB — FOLATE: Folate: >20 ng/mL

## 2014-01-10 LAB — IRON AND TIBC
IRON: 62 ug/dL (ref 42–135)
Saturation Ratios: 17 % — ABNORMAL LOW (ref 20–55)
TIBC: 356 ug/dL (ref 250–470)
UIBC: 294 ug/dL (ref 125–400)

## 2014-01-10 NOTE — Progress Notes (Signed)
Name: Jean [name]   MRN: [id number]  Date:  01/10/2014  DOB: 1960/06/10  Status:outpatient   DIAGNOSIS: Left Breast cancer.  CONSENT VERIFIED: yes SET UP: Patient is setup supine  IMMOBILIZATION:  The following immobilization was used:Custom Moldable Pillow, breast board.  NARRATIVE: [name] was brought to the [facility].  Identity was confirmed.  All relevant records and images related to the planned course of therapy were reviewed.  Then, the patient was positioned in a stable reproducible clinical set-up for radiation therapy.  Wires were placed to delineate the clinical extent of breast tissue. A wire was placed on the scar as well.  CT images were obtained.  An isocenter was placed. Skin markings were placed.  The position of the heart was then analyzed.  Due to the proximity of the heart to the chest wall, I felt she would benefit from deep inspiration breath hold for cardiac sparing.  She was then coached and rescanned in the breath hold position.  Acceptable cardiac sparing was achieved. The CT images were loaded into the planning software where the target and avoidance structures were contoured.  The radiation prescription was entered and confirmed. The patient was discharged in stable condition and tolerated simulation well.    TREATMENT PLANNING NOTE/3D Simulation Note Treatment planning then occurred. I have requested : MLC's, isodose plan, basic dose calculation  3D simulation was performed.  I personally designed and supervised the construction of 3 medically necessary complex treatment devices in the form of MLCs which will be used for beam modification and to protect critical structures including the heart and lung as well as the immobilization device which is necessary for reproducible set up.  I have requested a dose volume histogram of the heart, lung and tumor cavity.    RESPIRATORY MOTION MANAGEMENT SIMULATION - Deep Inspiration Breath Hold  NARRATIVE:   In order to account for effect of respiratory motion on target structures and other organs in the planning and delivery of radiotherapy, this patient underwent respiratory motion management simulation.  To accomplish this, when the patient was brought to the CT simulation planning suite, a bellows was placed on the her abdomen.  Wave forms of the patient's breathing were obtained. Coaching was performed and practice sessions initiated to monitor her ability to obtain and maintain deep inspiration breath hold.  The CT images were loaded into the planning software and fused with her free breathing images by physics.  Acceptable cardiac sparing was achieved through the use of deep inspiration breath hold.  Planning will be performed on her breath hold scan

## 2014-01-10 NOTE — Progress Notes (Signed)
Patient ID: Jean [name], female   DOB: 08/01/1960, 53 y.o.   MRN: [id number] Primary Oncologist: Dr. Lindi Adie  53 yo presents to cardio-oncology clinic for evaluation in the setting of Herceptin use. Patient had left breast cancer diagnosed in 7/15.  ER+/PR+/HER-2+.  She had left lumpectomy in 7/15 and is undergoing adjuvant chemo with Taxol/Herceptin to be followed by radiation and Herceptin every 3 wks x 1 year.   She returns for follow up. On 11/4 had echo with EF 60-65% and normal parameters. On 11/17 seen in [facility] with SOB/CP. Had CT scan with normal lungs and no PE.  F/u ETT/Myoview. EF 62% Normal perfusion. Walked only 3:00 Stopepd due to SOB sats dropped to 92%. F/u echo on 11/24 showed norm LV function. Normal lateral s' and GLS. Mild PHTN. Herceptin was stopped and she was started on lisinopril and carvedilol . She says her PCP stopped lisinopril and started on prinizide. Mild dyspnea with exertion. She says breathing is much better.  Denies PND/Orthopnea. Weight at home 187 pounds. Not exercising.  Currently not working. Plan to start radiation 01/17/14   + snoring. + daytime fatigue  Labs (12/20/13): K 3.4, creatinine 0.8, hgb 9.1 Labs (12/27/13): K 3.7 creatinine 1.0 hgb 8.5  PMH: 1. Depression 2. HTN: No meds at this time.  3. H/o hysterectomy 4. Breast cancer: left breast cancer diagnosed in 7/15.  ER+/PR+/HER-2+.  She had left lumpectomy in 7/15 and is undergoing adjuvant chemo with Taxol/Herceptin to be followed by radiation and Herceptin every 3 wks x 1 year.   - Echo (8/15) with EF 55-60%, grade II diastolic dysfunction, GLS -18%, lateral s' 11.4.  - Echo (12/07/13) with EF 04-88%, grade I diastolic dysfunction, GLS -15.9%, lateral s' 11.9.  - Echo (12/23/13) with EF 89-16%, grade I diastolic dysfunction, GLS -17.5%, lateral s' 94.5, PA systolic pressure 42 mmHg.   SH: Prior smoker, quit in 2010.  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     FH: Father with MI at 72 and CHF, maternal GF with MI, brother with CABG around age 32.    ROS: All systems reviewed and negative except as per HPI.   Current Outpatient Prescriptions  Medication Sig Dispense Refill  . carvedilol (COREG) 3.125 MG tablet Take 1 tablet (3.125 mg total) by mouth 2 (two) times daily. 60 tablet 3  . cholecalciferol (VITAMIN D) 1000 UNITS tablet Take 1,000 Units by mouth daily.    . Cyanocobalamin (VITAMIN B 12 PO) Take 1 tablet by mouth daily.    Marland Kitchen gabapentin (NEURONTIN) 300 MG capsule Take 1 capsule (300 mg total) by mouth at bedtime. 30 capsule 0  . HYDROcodone-acetaminophen (NORCO/VICODIN) 5-325 MG per tablet     . lisinopril-hydrochlorothiazide (PRINZIDE,ZESTORETIC) 10-12.5 MG per tablet TAKE 1 TABLET BY MOUTH DAILY. 30 tablet 0  . Multiple Vitamin (MULTIVITAMIN) tablet Take 1 tablet by mouth daily.    Marland Kitchen omeprazole (PRILOSEC) 40 MG capsule Take 1 capsule (40 mg total) by mouth daily. 30 capsule 3  . oxyCODONE-acetaminophen (PERCOCET) 10-325 MG per tablet Take 1 tablet by mouth every 4 (four) hours as needed for pain. 30 tablet 0  . UNABLE TO FIND Cranial Prothesis    . venlafaxine XR (EFFEXOR-XR) 37.5 MG 24 hr capsule TAKE ONE CAPSULE BY MOUTH EVERY DAY WITH BREAKFAST 30 capsule 0  . zolpidem (AMBIEN) 5 MG tablet Take 5 mg by mouth at bedtime as needed for sleep.    Marland Kitchen  lidocaine-prilocaine (EMLA) cream Apply 1 application topically as needed. (Patient not taking: Reported on 01/10/2014) 30 g 3  . LORazepam (ATIVAN) 0.5 MG tablet Take 1 tablet (0.5 mg total) by mouth every 6 (six) hours as needed (as needed for nausea and vomiting). (Patient not taking: Reported on 01/10/2014) 30 tablet 0  . ondansetron (ZOFRAN) 8 MG tablet Take 1 tablet (8 mg total) by mouth 2 (two) times daily. (Patient not taking: Reported on 12/27/2013) 30 tablet 1  . prochlorperazine (COMPAZINE) 10 MG tablet Take 1 tablet (10 mg total) by mouth every 6 (six) hours as needed for nausea or  vomiting. (Patient not taking: Reported on 12/27/2013) 30 tablet 1   No current facility-administered medications for this encounter.    BP 100/82 mmHg  Pulse 110  Wt 195 lb (88.451 kg)  SpO2 98%  LMP 11/03/2008 General: NAD Neck: No JVD, no thyromegaly or thyroid nodule.  Lungs: Clear to auscultation bilaterally with normal respiratory effort. CV: Nondisplaced PMI.  Heart mildly tachy, regular S1/S2, no S3/S4, TR 2/6 murmur.  No peripheral edema.  No carotid bruit.  Normal pedal pulses.  Abdomen: Soft, nontender, no hepatosplenomegaly, no distention.  Skin: Intact without lesions or rashes.  Neurologic: Alert and oriented x 3.  Psych: Normal affect. Extremities: No clubbing or cyanosis.  HEENT: Normal.   Assessment/Plan: 1. Breast cancer: T1cN0, stage IA, left breast invasive ductal carcinoma, grade II, ER 100%, PR 91%, Ki-67 47%, HER-2/neu positive Started Herceptin September 2015 this was stopped in November due to drop in lateral s'.   - On carvedilol - On lisinopril but PCP stopped and started lisinopril/hctz combo.  - 2. Chest pain: resolved 3- Dyspnea. 4. Anemia 5. Snores- day time fatigue. Refer to pulmonary for sleep apnea.    CLEGG,AMY 01/10/2014   Patient seen and examined with Darrick Grinder, NP. We discussed all aspects of the encounter. I agree with the assessment and plan as stated above.   I have reviewed all echos and previous work-ups. I think LV function is fine and I doubt that she has Herceptin-induced cardiomyopathy. In reviewing her most recent studies she is more tachycardic and has had progressive increase in her pulmonary pressures. CT scan negative for PE. I think the driving force here is high output pulmonary HTN due primarily to her anemia and possibly OSA. We will draw labs today and she will see Dr. Sonny Dandy on Thursday. I suspect she would benefit from a transfusion. If this doesn't make her feel better can then proceed with PFTs and Sleep evaluation. It  is ok for her to resume Herceptin. Will stop carvedilol at this point.   Total time spent 45 minutes. Over half that time spent discussing above.   Benay Spice 4:36 PM

## 2014-01-10 NOTE — Progress Notes (Signed)
   Department of Radiation Oncology  Phone:  [phone] Fax:        [phone]   Name: Paula Busenbark MRN: [id number]  DOB: [birth year]  Date: 01/10/2014  Follow Up Visit Note  Diagnosis: Breast cancer of upper-outer quadrant of left female breast   Staging form: Breast, AJCC 7th Edition     Clinical: Stage IIA (T2, N0, cM0) - Signed by Rulon Eisenmenger, MD on 09/20/2013       Staging comments: Staged at breast conference 08/17/13.      Pathologic: T1c, N0, cM0 - Unsigned  Interval History: Jean [name] presents today for routine followup.  She has completed chemotherapy and is ready to start RT.  She had fatigue, shortness of breath and nausea with chemo. She is ready to be done!  She has had Herceptin held by cardiology due to her shortness of breath.   Physical Exam:  Filed Vitals:   01/10/14 1008  BP: 107/68  Pulse: 82  Temp: 98.5 F (36.9 C)  TempSrc: Oral  Resp: 16  Height: 5\' 6"  (1.676 m)  Weight: 196 lb (88.905 kg)   Pleasant. Well healed medial surgical incision. No lymphedema.   IMPRESSION: Jean [name] is a 53 y.o. female s/p lumpectomy s/p chemo now ready for RT.   PLAN:  I spoke to the patient today regarding her diagnosis and options for treatment. We discussed the equivalence in terms of survival and local failure between mastectomy and breast conservation. We discussed the role of radiation in decreasing local failures in patients who undergo lumpectomy. We discussed the process of simulation and the placement tattoos. We discussed 6 weeks of treatment as an outpatient. We discussed the possibility of asymptomatic lung damage and the use of breath hold technique for cardiac sparing if necessary. We discussed the low likelihood of secondary malignancies. We discussed the possible side effects including but not limited to skin redness, fatigue, permanent skin darkening, and breast swelling. She signed informed consent and agreed to proceed with simulation today.     Thea Silversmith,  MD

## 2014-01-10 NOTE — Progress Notes (Signed)
Radiation Oncology         [phone] ________________________________  Name: Jean [name]      MRN: [id number]          Date: 01/10/2014              DOB: 1960-04-17  Optical Surface Tracking Plan:  Since intensity modulated radiotherapy (IMRT) and 3D conformal radiation treatment methods are predicated on accurate and precise positioning for treatment, intrafraction motion monitoring is medically necessary to ensure accurate and safe treatment delivery.  The ability to quantify intrafraction motion without excessive ionizing radiation dose can only be performed with optical surface tracking. Accordingly, surface imaging offers the opportunity to obtain 3D measurements of patient position throughout IMRT and 3D treatments without excessive radiation exposure.  I am ordering optical surface tracking for this patient's upcoming course of radiotherapy. ________________________________ Signature   Reference:   Ursula Alert, J, et al. Surface imaging-based analysis of intrafraction motion for breast radiotherapy patients.Journal of [place], n. 6, nov. 2014. ISSN [id number].   Available at: <http://www.jacmp.org/index.php/jacmp/article/view/4957>.

## 2014-01-10 NOTE — Patient Instructions (Signed)
Stop Carvedilol  Labs today  Your physician recommends that you schedule a follow-up appointment in: 1 month

## 2014-01-10 NOTE — Progress Notes (Signed)
Please see the Nurse Progress Note in the MD Initial Consult Encounter for this patient. 

## 2014-01-10 NOTE — Progress Notes (Signed)
Name: Jean [name]   MRN: [id number]  Date:  01/10/2014  DOB: Feb 11, 1960  Status:outpatient   DIAGNOSIS: Left Breast cancer.  CONSENT VERIFIED: yes SET UP: Patient is setup supine  IMMOBILIZATION:  The following immobilization was used:Custom Moldable Pillow, breast board.  NARRATIVE: [name] was brought to the [place].  Identity was confirmed.  All relevant records and images related to the planned course of therapy were reviewed.  Then, the patient was positioned in a stable reproducible clinical set-up for radiation therapy.  Wires were placed to delineate the clinical extent of breast tissue. A wire was placed on the scar as well.  CT images were obtained.  An isocenter was placed. Skin markings were placed.  The position of the heart was then analyzed.  Due to the proximity of the heart to the chest wall, I felt she would benefit from deep inspiration breath hold for cardiac sparing.  She was then coached and rescanned in the breath hold position.  Acceptable cardiac sparing was achieved. The CT images were loaded into the planning software where the target and avoidance structures were contoured.  The radiation prescription was entered and confirmed. The patient was discharged in stable condition and tolerated simulation well.    TREATMENT PLANNING NOTE/3D Simulation Note Treatment planning then occurred. I have requested : MLC's, isodose plan, basic dose calculation  3D simulation was performed.  I personally designed and supervised the construction of 3 medically necessary complex treatment devices in the form of MLCs which will be used for beam modification and to protect critical structures including the heart and lung as well as the immobilization device which is necessary for reproducible set up.  I have requested a dose volume histogram of the heart, lung and tumor cavity.    RESPIRATORY MOTION MANAGEMENT SIMULATION - Deep Inspiration Breath Hold  NARRATIVE:   In order to account for effect of respiratory motion on target structures and other organs in the planning and delivery of radiotherapy, this patient underwent respiratory motion management simulation.  To accomplish this, when the patient was brought to the CT simulation planning suite, a bellows was placed on the her abdomen.  Wave forms of the patient's breathing were obtained. Coaching was performed and practice sessions initiated to monitor her ability to obtain and maintain deep inspiration breath hold.  The CT images were loaded into the planning software and fused with her free breathing images by physics.  Acceptable cardiac sparing was achieved through the use of deep inspiration breath hold.  Planning will be performed on her breath hold scan

## 2014-01-11 ENCOUNTER — Ambulatory Visit: Payer: BC Managed Care – PPO

## 2014-01-11 ENCOUNTER — Encounter: Payer: Self-pay | Admitting: Radiation Oncology

## 2014-01-11 ENCOUNTER — Other Ambulatory Visit: Payer: Self-pay | Admitting: *Deleted

## 2014-01-11 ENCOUNTER — Ambulatory Visit: Payer: BC Managed Care – PPO | Admitting: Radiation Oncology

## 2014-01-11 DIAGNOSIS — C50412 Malignant neoplasm of upper-outer quadrant of left female breast: Secondary | ICD-10-CM

## 2014-01-11 LAB — TYPE AND SCREEN
ABO/RH(D): B POS
ANTIBODY SCREEN: POSITIVE
DAT, IGG: NEGATIVE
PT AG Type: POSITIVE

## 2014-01-11 NOTE — Progress Notes (Signed)
Called [facility] 01/11/14 8:56am - per representative, NPR for op radiation therapy.  Patient's plan will pay 90% of allowed amounts, $400 deductible and $5500 OOP; referral not required.

## 2014-01-12 ENCOUNTER — Ambulatory Visit (HOSPITAL_BASED_OUTPATIENT_CLINIC_OR_DEPARTMENT_OTHER): Payer: BC Managed Care – PPO

## 2014-01-12 ENCOUNTER — Ambulatory Visit (HOSPITAL_BASED_OUTPATIENT_CLINIC_OR_DEPARTMENT_OTHER): Payer: BC Managed Care – PPO | Admitting: Hematology and Oncology

## 2014-01-12 ENCOUNTER — Ambulatory Visit: Payer: BC Managed Care – PPO | Admitting: Hematology and Oncology

## 2014-01-12 ENCOUNTER — Other Ambulatory Visit: Payer: BC Managed Care – PPO

## 2014-01-12 ENCOUNTER — Other Ambulatory Visit (HOSPITAL_BASED_OUTPATIENT_CLINIC_OR_DEPARTMENT_OTHER): Payer: BC Managed Care – PPO

## 2014-01-12 ENCOUNTER — Telehealth: Payer: Self-pay | Admitting: Hematology and Oncology

## 2014-01-12 ENCOUNTER — Telehealth: Payer: Self-pay | Admitting: *Deleted

## 2014-01-12 VITALS — BP 117/77 | HR 96 | Temp 98.2°F | Resp 19 | Ht 66.0 in | Wt 195.3 lb

## 2014-01-12 DIAGNOSIS — C50812 Malignant neoplasm of overlapping sites of left female breast: Secondary | ICD-10-CM

## 2014-01-12 DIAGNOSIS — Z170 Estrogen receptor positive status [ER+]: Secondary | ICD-10-CM

## 2014-01-12 DIAGNOSIS — Z5112 Encounter for antineoplastic immunotherapy: Secondary | ICD-10-CM

## 2014-01-12 DIAGNOSIS — C50412 Malignant neoplasm of upper-outer quadrant of left female breast: Secondary | ICD-10-CM

## 2014-01-12 LAB — CBC WITH DIFFERENTIAL/PLATELET
BASO%: 0.2 % (ref 0.0–2.0)
BASOS ABS: 0 10*3/uL (ref 0.0–0.1)
EOS%: 0.5 % (ref 0.0–7.0)
Eosinophils Absolute: 0 10*3/uL (ref 0.0–0.5)
HCT: 27.7 % — ABNORMAL LOW (ref 34.8–46.6)
HEMOGLOBIN: 8.7 g/dL — AB (ref 11.6–15.9)
LYMPH%: 42.9 % (ref 14.0–49.7)
MCH: 27 pg (ref 25.1–34.0)
MCHC: 31.4 g/dL — AB (ref 31.5–36.0)
MCV: 86 fL (ref 79.5–101.0)
MONO#: 0.4 10*3/uL (ref 0.1–0.9)
MONO%: 7.9 % (ref 0.0–14.0)
NEUT#: 2.1 10*3/uL (ref 1.5–6.5)
NEUT%: 48.5 % (ref 38.4–76.8)
Platelets: 359 10*3/uL (ref 145–400)
RBC: 3.22 10*6/uL — AB (ref 3.70–5.45)
RDW: 17.8 % — ABNORMAL HIGH (ref 11.2–14.5)
WBC: 4.4 10*3/uL (ref 3.9–10.3)
lymph#: 1.9 10*3/uL (ref 0.9–3.3)

## 2014-01-12 LAB — COMPREHENSIVE METABOLIC PANEL (CC13)
ALBUMIN: 3.4 g/dL — AB (ref 3.5–5.0)
ALK PHOS: 71 U/L (ref 40–150)
ALT: 21 U/L (ref 0–55)
AST: 20 U/L (ref 5–34)
Anion Gap: 10 mEq/L (ref 3–11)
BUN: 14.8 mg/dL (ref 7.0–26.0)
CO2: 26 mEq/L (ref 22–29)
CREATININE: 0.8 mg/dL (ref 0.6–1.1)
Calcium: 9.2 mg/dL (ref 8.4–10.4)
Chloride: 104 mEq/L (ref 98–109)
EGFR: >90 mL/min/{1.73_m2} (ref >90)
Glucose: 110 mg/dl (ref 70–140)
POTASSIUM: 3.8 meq/L (ref 3.5–5.1)
Sodium: 139 mEq/L (ref 136–145)
Total Bilirubin: 0.26 mg/dL (ref 0.20–1.20)
Total Protein: 6.8 g/dL (ref 6.4–8.3)

## 2014-01-12 MED ORDER — ACETAMINOPHEN 325 MG PO TABS
650.0000 mg | ORAL_TABLET | Freq: Once | ORAL | Status: AC
  Administered 2014-01-12: 650 mg via ORAL

## 2014-01-12 MED ORDER — DIPHENHYDRAMINE HCL 25 MG PO CAPS
50.0000 mg | ORAL_CAPSULE | Freq: Once | ORAL | Status: AC
  Administered 2014-01-12: 50 mg via ORAL

## 2014-01-12 MED ORDER — SODIUM CHLORIDE 0.9 % IJ SOLN
10.0000 mL | INTRAMUSCULAR | Status: DC | PRN
  Administered 2014-01-12: 10 mL
  Filled 2014-01-12: qty 10

## 2014-01-12 MED ORDER — ACETAMINOPHEN 325 MG PO TABS
ORAL_TABLET | ORAL | Status: AC
  Filled 2014-01-12: qty 2

## 2014-01-12 MED ORDER — HEPARIN SOD (PORK) LOCK FLUSH 100 UNIT/ML IV SOLN
500.0000 [IU] | Freq: Once | INTRAVENOUS | Status: AC | PRN
  Administered 2014-01-12: 500 [IU]
  Filled 2014-01-12: qty 5

## 2014-01-12 MED ORDER — TRASTUZUMAB CHEMO INJECTION 440 MG
6.0000 mg/kg | Freq: Once | INTRAVENOUS | Status: AC
  Administered 2014-01-12: 525 mg via INTRAVENOUS
  Filled 2014-01-12: qty 25

## 2014-01-12 MED ORDER — DIPHENHYDRAMINE HCL 25 MG PO CAPS
ORAL_CAPSULE | ORAL | Status: AC
  Filled 2014-01-12: qty 2

## 2014-01-12 NOTE — Progress Notes (Signed)
Patient Care Team: Gavin Pound, MD as PCP - General (Family Medicine) [name], MD as Consulting Physician (General Surgery) Thea Silversmith, MD as Consulting Physician (Radiation Oncology) Gavin Pound, MD as Consulting Physician (Family Medicine) Rulon Eisenmenger, MD as Consulting Physician (Hematology and Oncology)  DIAGNOSIS: Breast cancer of upper-outer quadrant of left female breast   Staging form: Breast, AJCC 7th Edition     Clinical: Stage IIA (T2, N0, cM0) - Signed by Rulon Eisenmenger, MD on 09/20/2013       Staging comments: Staged at breast conference 08/17/13.      Pathologic: T1c, N0, cM0 - Unsigned   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  The cancer was grade 2 and ER positive at 100%, PR+ at 91% and HER2- with a Ki67 of 47%   08/12/2013 Breast MRI 2.1 x 1.2 x 1.2 cm biopsy-proven invasive ductal carcinoma and ductal carcinoma in situ in the 9 o'clock position of the left breast   08/30/2013 Surgery Left Lumpectomy IDC with infiltrating lobular features. Pos for LVSI; 1 mm from nearest margin, DCIS 1 SLN Neg; ONCOTYPE Dx Rec score 17, 10 yr risk of recurrance is 11% with Tamoxifen alone   10/04/2013 - 12/27/2013 Chemotherapy Weekly Taxol/Herceptin x 12; Herceptin maintenance q. 3 weeks until end of August 2016    CHIEF COMPLIANT: Herceptin maintenance  INTERVAL HISTORY: Jean [name] is a 81 a recommended with above-mentioned history of left-sided breast cancer treated with lumpectomy and had completed 3 months of Taxol Herceptin treatments given weekly. She is here today for the first of the every 3 week Herceptin maintenance treatments. She continues to  feel tired but notices that her tiredness is slowly improving. She has met with radiation oncology and plans to start radiation middle of December.denies any nausea vomiting.  REVIEW OF SYSTEMS:   Constitutional: Denies fevers, chills or abnormal weight loss Eyes: Denies blurriness of vision Ears, nose, mouth, throat, and face: Denies mucositis or sore throat Respiratory: Denies cough, dyspnea or wheezes Cardiovascular: Denies palpitation, chest discomfort or lower extremity swelling Gastrointestinal:  Denies nausea, heartburn or change in bowel habits Skin: Denies abnormal skin rashes Lymphatics: Denies new lymphadenopathy or easy bruising Neurological:Denies numbness, tingling or new weaknesses Behavioral/Psych: Mood is stable, no new changes  Breast:  denies any pain or lumps or nodules in either breasts All other systems were reviewed with the patient and are negative.  I have reviewed the past medical history, past surgical history, social history and family history with the patient and they are unchanged from previous note.  ALLERGIES:  is allergic to hydrocodone.  MEDICATIONS:  Current Outpatient Prescriptions  Medication Sig Dispense Refill  . cholecalciferol (VITAMIN D) 1000 UNITS tablet Take 1,000 Units by mouth daily.    . Cyanocobalamin (VITAMIN B 12 PO) Take 1 tablet by mouth daily.    Marland Kitchen gabapentin (NEURONTIN) 300 MG capsule Take 1 capsule (300 mg total) by mouth at bedtime. 30 capsule 0  . HYDROcodone-acetaminophen (NORCO/VICODIN) 5-325 MG per tablet     . lidocaine-prilocaine (EMLA) cream Apply 1 application topically as needed. 30 g 3  . lisinopril-hydrochlorothiazide (PRINZIDE,ZESTORETIC) 10-12.5 MG per tablet TAKE 1 TABLET BY MOUTH DAILY. [place]  tablet 0  . LORazepam (ATIVAN) 0.5 MG tablet Take 1 tablet (0.5 mg total) by mouth every 6 (six) hours as needed (as needed for nausea and vomiting). 30 tablet 0  . Multiple Vitamin (MULTIVITAMIN) tablet Take 1 tablet by mouth  daily.    Marland Kitchen omeprazole (PRILOSEC) 40 MG capsule Take 1 capsule (40 mg total) by mouth daily. 30 capsule 3  . oxyCODONE-acetaminophen (PERCOCET) 10-325 MG per tablet Take 1 tablet by mouth every 4 (four) hours as needed for pain. 30 tablet 0  . prochlorperazine (COMPAZINE) 10 MG tablet Take 1 tablet (10 mg total) by mouth every 6 (six) hours as needed for nausea or vomiting. 30 tablet 1  . UNABLE TO FIND Cranial Prothesis    . venlafaxine XR (EFFEXOR-XR) 37.5 MG 24 hr capsule TAKE ONE CAPSULE BY MOUTH EVERY DAY WITH BREAKFAST 30 capsule 0  . zolpidem (AMBIEN) 5 MG tablet Take 5 mg by mouth at bedtime as needed for sleep.     No current facility-administered medications for this visit.   Facility-Administered Medications Ordered in Other Visits  Medication Dose Route Frequency Provider Last Rate Last Dose  . heparin lock flush 100 unit/mL  500 Units Intracatheter Once PRN Rulon Eisenmenger, MD      . sodium chloride 0.9 % injection 10 mL  10 mL Intracatheter PRN Rulon Eisenmenger, MD      . trastuzumab (HERCEPTIN) 525 mg in sodium chloride 0.9 % 250 mL chemo infusion  6 mg/kg (Treatment Plan Actual) Intravenous Once Rulon Eisenmenger, MD 550 mL/hr at 01/12/14 1215 525 mg at 01/12/14 1215    PHYSICAL EXAMINATION: ECOG PERFORMANCE STATUS: 1 - Symptomatic but completely ambulatory  Filed Vitals:   01/12/14 1033  BP: 117/77  Pulse: 96  Temp: 98.2 F (36.8 C)  Resp: 19   Filed Weights   01/12/14 1033  Weight: 195 lb 4.8 oz (88.587 kg)    GENERAL:alert, no distress and comfortable SKIN: skin color, texture, turgor are normal, no rashes or significant lesions EYES: normal, Conjunctiva are pink and non-injected, sclera clear OROPHARYNX:no exudate, no erythema and lips, buccal mucosa, and tongue normal  NECK: supple, thyroid normal size, non-tender, without nodularity LYMPH:  no palpable lymphadenopathy in the cervical, axillary or inguinal LUNGS: clear to auscultation and percussion with normal  breathing effort HEART: regular rate & rhythm and no murmurs and no lower extremity edema ABDOMEN:abdomen soft, non-tender and normal bowel sounds Musculoskeletal:no cyanosis of digits and no clubbing  NEURO: alert & oriented x 3 with fluent speech, no focal motor/sensory deficits  LABORATORY DATA:  I have reviewed the data as listed   Chemistry      Component Value Date/Time   NA 139 01/12/2014 1027   NA 142 01/10/2014 1639   K 3.8 01/12/2014 1027   K 3.8 01/10/2014 1639   CL 101 01/10/2014 1639   CO2 26 01/12/2014 1027   CO2 28 01/10/2014 1639   BUN 14.8 01/12/2014 1027   BUN 12 01/10/2014 1639   CREATININE 0.8 01/12/2014 1027   CREATININE 0.80 01/10/2014 1639      Component Value Date/Time   CALCIUM 9.2 01/12/2014 1027   CALCIUM 10.0 01/10/2014 1639   ALKPHOS 71 01/12/2014 1027   ALKPHOS 73 01/10/2014 1639   AST 20 01/12/2014 1027   AST 19 01/10/2014 1639   ALT 21 01/12/2014 1027   ALT 18 01/10/2014 1639   BILITOT 0.26 01/12/2014 1027   BILITOT <0.2* 01/10/2014 1639  Lab Results  Component Value Date   WBC 4.4 01/12/2014   HGB 8.7* 01/12/2014   HCT 27.7* 01/12/2014   MCV 86.0 01/12/2014   PLT 359 01/12/2014   NEUTROABS 2.1 01/12/2014    ASSESSMENT & PLAN:  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 Lucia Gaskins, received Taxol Herceptin on 10/04/2013 weekly x12, now on every 3 week Herceptin maintenance, radiation to start December 15  Herceptin toxicities: Patient reports that she had shortness of breath but it's getting better over time. She had seen Dr. Haroldine Laws who evaluated her echocardiogram and recommended continuing Herceptin treatments and to consider her for blood transfusion.  I discussed with her about her symptoms and she reports her  shortness of breath is getting markedly better and that she would like to hold off on the blood transfusion. I agree with her and we will see her back in 3 weeks for recheck of her CBC and labs. Previously for anemia iron studies J67 folic acid were all normal.  Return to clinic in 3 weeks for followup for Herceptin treatment.   No orders of the defined types were placed in this encounter.   The patient has a good understanding of the overall plan. she agrees with it. She will call with any problems that may develop before her next visit here.   Rulon Eisenmenger, MD 01/12/2014 12:25 PM

## 2014-01-12 NOTE — Assessment & Plan Note (Signed)
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 Lucia Gaskins, received Taxol Herceptin on 10/04/2013 weekly x12, now on every 3 week Herceptin maintenance, radiation to start December 15  Herceptin toxicities: Patient reports that she had shortness of breath but it's getting better over time. She had seen Dr. Haroldine Laws who evaluated her echocardiogram and recommended continuing Herceptin treatments and to consider her for blood transfusion.  I discussed with her about her symptoms and she reports her shortness of breath is getting markedly better and that she would like to hold off on the blood transfusion. I agree with her and we will see her back in 3 weeks for recheck of her CBC and labs. Previously for anemia iron studies F79 folic acid were all normal.  Return to clinic in 3 weeks for followup for Herceptin treatment.

## 2014-01-12 NOTE — Telephone Encounter (Signed)
Per staff message and POF I have scheduled appts. Advised scheduler of appts. JMW  

## 2014-01-12 NOTE — Patient Instructions (Signed)
[facility] Discharge Instructions for Patients Receiving Chemotherapy  Today you received the following chemotherapy agents Herceptin.  To help prevent nausea and vomiting after your treatment, we encourage you to take your nausea medication as directed.   If you develop nausea and vomiting that is not controlled by your nausea medication, call the clinic.   BELOW ARE SYMPTOMS THAT SHOULD BE REPORTED IMMEDIATELY:  *FEVER GREATER THAN 100.5 F  *CHILLS WITH OR WITHOUT FEVER  NAUSEA AND VOMITING THAT IS NOT CONTROLLED WITH YOUR NAUSEA MEDICATION  *UNUSUAL SHORTNESS OF BREATH  *UNUSUAL BRUISING OR BLEEDING  TENDERNESS IN MOUTH AND THROAT WITH OR WITHOUT PRESENCE OF ULCERS  *URINARY PROBLEMS  *BOWEL PROBLEMS  UNUSUAL RASH Items with * indicate a potential emergency and should be followed up as soon as possible.  Feel free to call the clinic you have any questions or concerns. The clinic phone number is [phone].  

## 2014-01-12 NOTE — Telephone Encounter (Signed)
per pof to sch pt appt-gave pt copy of sch-sent MW email to sch trmt-pt getting updated herc sch b4 leaving trmt room today

## 2014-01-13 ENCOUNTER — Telehealth: Payer: Self-pay

## 2014-01-13 NOTE — Telephone Encounter (Signed)
Medical records request received from [employer].  Put in [employer] in box.

## 2014-01-16 DIAGNOSIS — C50412 Malignant neoplasm of upper-outer quadrant of left female breast: Secondary | ICD-10-CM | POA: Diagnosis not present

## 2014-01-17 ENCOUNTER — Ambulatory Visit
Admission: RE | Admit: 2014-01-17 | Discharge: 2014-01-17 | Disposition: A | Discharge status: Home / Self Care | Payer: BC Managed Care – PPO | Source: Ambulatory Visit | Attending: Radiation Oncology | Admitting: Radiation Oncology

## 2014-01-17 ENCOUNTER — Ambulatory Visit
Admission: RE | Admit: 2014-01-17 | Discharge: 2014-01-17 | Disposition: A | Discharge status: Home / Self Care | Payer: BLUE CROSS/BLUE SHIELD | Source: Ambulatory Visit | Attending: Radiation Oncology | Admitting: Radiation Oncology

## 2014-01-17 ENCOUNTER — Telehealth: Payer: Self-pay

## 2014-01-17 ENCOUNTER — Encounter: Payer: Self-pay | Admitting: Hematology and Oncology

## 2014-01-17 DIAGNOSIS — C50412 Malignant neoplasm of upper-outer quadrant of left female breast: Secondary | ICD-10-CM | POA: Diagnosis not present

## 2014-01-17 NOTE — Telephone Encounter (Signed)
Form received by fax from [facility] requesting additional info.  Placed in [employer] box.

## 2014-01-17 NOTE — Progress Notes (Signed)
  Radiation Oncology         [phone] ________________________________  Name: Jean [name] MRN: [id number]  Date: 01/17/2014  DOB: 07-10-1960  Simulation Verification Note  Status: outpatient  NARRATIVE: The patient was brought to the treatment unit and placed in the planned treatment position. The clinical setup was verified. Then port films were obtained and uploaded to the radiation oncology medical record software.  The treatment beams were carefully compared against the planned radiation fields. The position location and shape of the radiation fields was reviewed. The targeted volume of tissue appears appropriately covered by the radiation beams. Organs at risk appear to be excluded as planned.  Based on my personal review, I approved the simulation verification. The patient's treatment will proceed as planned.  ------------------------------------------------  Thea Silversmith, MD

## 2014-01-17 NOTE — Progress Notes (Signed)
Faxed clinical information to [employer] for patient's disability

## 2014-01-18 ENCOUNTER — Ambulatory Visit
Admission: RE | Admit: 2014-01-18 | Discharge: 2014-01-18 | Disposition: A | Discharge status: Home / Self Care | Payer: BLUE CROSS/BLUE SHIELD | Source: Ambulatory Visit | Attending: Radiation Oncology | Admitting: Radiation Oncology

## 2014-01-18 DIAGNOSIS — C50412 Malignant neoplasm of upper-outer quadrant of left female breast: Secondary | ICD-10-CM

## 2014-01-18 MED ORDER — ALRA NON-METALLIC DEODORANT (RAD-ONC)
1.0000 "application " | Freq: Once | TOPICAL | Status: AC
  Administered 2014-01-18: 1 via TOPICAL

## 2014-01-18 MED ORDER — RADIAPLEXRX EX GEL
Freq: Once | CUTANEOUS | Status: AC
  Administered 2014-01-18: 16:00:00 via TOPICAL

## 2014-01-18 NOTE — Progress Notes (Signed)
Routine of clinic reviewed with patient.Aware of weekly doctor assessment on Tuesday.Given Radiation Therapy and You booklet, skin care sheet, alra deodorant and radiaplex.reviewed possible side effects to include skin discoloration,tenderness, swelling and fatigue.Informed not to wear underwire bra.Informed if she has question or problems to inform therapist and they will send to nursing to take care of needs.

## 2014-01-19 ENCOUNTER — Ambulatory Visit
Admission: RE | Admit: 2014-01-19 | Discharge: 2014-01-19 | Disposition: A | Discharge status: Home / Self Care | Payer: BLUE CROSS/BLUE SHIELD | Source: Ambulatory Visit | Attending: Radiation Oncology | Admitting: Radiation Oncology

## 2014-01-19 DIAGNOSIS — C50412 Malignant neoplasm of upper-outer quadrant of left female breast: Secondary | ICD-10-CM | POA: Diagnosis not present

## 2014-01-20 ENCOUNTER — Ambulatory Visit
Admission: RE | Admit: 2014-01-20 | Discharge: 2014-01-20 | Disposition: A | Discharge status: Home / Self Care | Payer: BLUE CROSS/BLUE SHIELD | Source: Ambulatory Visit | Attending: Radiation Oncology | Admitting: Radiation Oncology

## 2014-01-20 DIAGNOSIS — C50412 Malignant neoplasm of upper-outer quadrant of left female breast: Secondary | ICD-10-CM | POA: Diagnosis not present

## 2014-01-23 ENCOUNTER — Ambulatory Visit
Admission: RE | Admit: 2014-01-23 | Discharge: 2014-01-23 | Disposition: A | Discharge status: Home / Self Care | Payer: BLUE CROSS/BLUE SHIELD | Source: Ambulatory Visit | Attending: Radiation Oncology | Admitting: Radiation Oncology

## 2014-01-23 DIAGNOSIS — C50412 Malignant neoplasm of upper-outer quadrant of left female breast: Secondary | ICD-10-CM | POA: Diagnosis not present

## 2014-01-24 ENCOUNTER — Ambulatory Visit
Admission: RE | Admit: 2014-01-24 | Discharge: 2014-01-24 | Disposition: A | Discharge status: Home / Self Care | Payer: BC Managed Care – PPO | Source: Ambulatory Visit | Attending: Radiation Oncology | Admitting: Radiation Oncology

## 2014-01-24 ENCOUNTER — Encounter: Payer: Self-pay | Admitting: Radiation Oncology

## 2014-01-24 ENCOUNTER — Ambulatory Visit
Admission: RE | Admit: 2014-01-24 | Discharge: 2014-01-24 | Disposition: A | Discharge status: Home / Self Care | Payer: BLUE CROSS/BLUE SHIELD | Source: Ambulatory Visit | Attending: Radiation Oncology | Admitting: Radiation Oncology

## 2014-01-24 VITALS — BP 126/81 | HR 83 | Resp 16 | Wt 195.4 lb

## 2014-01-24 DIAGNOSIS — C50412 Malignant neoplasm of upper-outer quadrant of left female breast: Secondary | ICD-10-CM

## 2014-01-24 NOTE — Progress Notes (Signed)
Denies skin changes within treatment field. Reports using radiaplex bid as directed. Reports tenderness under left axilla. Denies fatigue.

## 2014-01-24 NOTE — Progress Notes (Signed)
  Radiation Oncology         [phone] ________________________________  Name: Jean [name] MRN: [id number]  Date: 01/24/2014  DOB: 1960-07-01  Weekly Radiation Therapy Management  Breast cancer of upper-outer quadrant of left female breast   Staging form: Breast, AJCC 7th Edition     Clinical: Stage IIA (T2, N0, cM0) - Signed by Rulon Eisenmenger, MD on 09/20/2013       Staging comments: Staged at breast conference 08/17/13.      Pathologic: T1c, N0, cM0 - Unsigned   Current Dose: 9 Gy     Planned Dose:  45 Gy  Narrative . . . . . . . . The patient presents for routine under treatment assessment.                                   The patient is without complaint.                                 Set-up films were reviewed.                                 The chart was checked. Physical Findings. . .  weight is 195 lb 6.4 oz (88.633 kg). Her blood pressure is 126/81 and her pulse is 83. Her respiration is 16. . The left breast area shows no appreciable radiation reaction at this point. Impression . . . . . . . The patient is tolerating radiation. Plan . . . . . . . . . . . . Continue treatment as planned.  ________________________________   Blair Promise, PhD, MD

## 2014-01-25 ENCOUNTER — Ambulatory Visit
Admission: RE | Admit: 2014-01-25 | Discharge: 2014-01-25 | Disposition: A | Discharge status: Home / Self Care | Payer: BLUE CROSS/BLUE SHIELD | Source: Ambulatory Visit | Attending: Radiation Oncology | Admitting: Radiation Oncology

## 2014-01-25 DIAGNOSIS — C50412 Malignant neoplasm of upper-outer quadrant of left female breast: Secondary | ICD-10-CM | POA: Diagnosis not present

## 2014-01-26 ENCOUNTER — Ambulatory Visit
Admission: RE | Admit: 2014-01-26 | Discharge: 2014-01-26 | Disposition: A | Discharge status: Home / Self Care | Payer: BLUE CROSS/BLUE SHIELD | Source: Ambulatory Visit | Attending: Radiation Oncology | Admitting: Radiation Oncology

## 2014-01-26 DIAGNOSIS — C50412 Malignant neoplasm of upper-outer quadrant of left female breast: Secondary | ICD-10-CM | POA: Diagnosis not present

## 2014-01-30 ENCOUNTER — Ambulatory Visit
Admission: RE | Admit: 2014-01-30 | Discharge: 2014-01-30 | Disposition: A | Discharge status: Home / Self Care | Payer: BLUE CROSS/BLUE SHIELD | Source: Ambulatory Visit | Attending: Radiation Oncology | Admitting: Radiation Oncology

## 2014-01-30 DIAGNOSIS — C50412 Malignant neoplasm of upper-outer quadrant of left female breast: Secondary | ICD-10-CM | POA: Diagnosis not present

## 2014-01-31 ENCOUNTER — Ambulatory Visit
Admission: RE | Admit: 2014-01-31 | Discharge: 2014-01-31 | Disposition: A | Discharge status: Home / Self Care | Payer: BC Managed Care – PPO | Source: Ambulatory Visit | Attending: Radiation Oncology | Admitting: Radiation Oncology

## 2014-01-31 ENCOUNTER — Other Ambulatory Visit: Payer: Self-pay

## 2014-01-31 ENCOUNTER — Ambulatory Visit
Admission: RE | Admit: 2014-01-31 | Discharge: 2014-01-31 | Disposition: A | Discharge status: Home / Self Care | Payer: BLUE CROSS/BLUE SHIELD | Source: Ambulatory Visit | Attending: Radiation Oncology | Admitting: Radiation Oncology

## 2014-01-31 VITALS — BP 140/103 | HR 90 | Temp 98.8°F | Wt 195.5 lb

## 2014-01-31 DIAGNOSIS — C50412 Malignant neoplasm of upper-outer quadrant of left female breast: Secondary | ICD-10-CM

## 2014-01-31 NOTE — Progress Notes (Signed)
Weekly Management Note Current Dose: 16.2  Gy  Projected Dose: 61 Gy   Narrative:  The patient presents for routine under treatment assessment.  CBCT/MVCT images/Port film x-rays were reviewed.  The chart was checked. Doing well. No complaints except insomnia and neuropathy at night.   Physical Findings: Weight: 195 lb 8 oz (88.678 kg). Unchanged  Impression:  The patient is tolerating radiation.  Plan:  Continue treatment as planned. Discussed trying to take her gabapentin at night.

## 2014-01-31 NOTE — Progress Notes (Signed)
Weekly assessment of radiation to left [email or web address].ch 9 treatments.Denies pain.Mild tanning of skin.continue application of radiaplex.

## 2014-02-01 ENCOUNTER — Other Ambulatory Visit (HOSPITAL_BASED_OUTPATIENT_CLINIC_OR_DEPARTMENT_OTHER): Payer: BC Managed Care – PPO

## 2014-02-01 ENCOUNTER — Ambulatory Visit
Admission: RE | Admit: 2014-02-01 | Discharge: 2014-02-01 | Disposition: A | Discharge status: Home / Self Care | Payer: BLUE CROSS/BLUE SHIELD | Source: Ambulatory Visit | Attending: Radiation Oncology | Admitting: Radiation Oncology

## 2014-02-01 ENCOUNTER — Ambulatory Visit (HOSPITAL_BASED_OUTPATIENT_CLINIC_OR_DEPARTMENT_OTHER): Payer: BC Managed Care – PPO | Admitting: Hematology and Oncology

## 2014-02-01 ENCOUNTER — Ambulatory Visit (HOSPITAL_BASED_OUTPATIENT_CLINIC_OR_DEPARTMENT_OTHER): Payer: BC Managed Care – PPO

## 2014-02-01 VITALS — BP 139/90 | HR 87 | Temp 98.5°F | Resp 18 | Ht 66.0 in | Wt 197.7 lb

## 2014-02-01 DIAGNOSIS — C50812 Malignant neoplasm of overlapping sites of left female breast: Secondary | ICD-10-CM

## 2014-02-01 DIAGNOSIS — D6481 Anemia due to antineoplastic chemotherapy: Secondary | ICD-10-CM

## 2014-02-01 DIAGNOSIS — Z5112 Encounter for antineoplastic immunotherapy: Secondary | ICD-10-CM

## 2014-02-01 DIAGNOSIS — C50412 Malignant neoplasm of upper-outer quadrant of left female breast: Secondary | ICD-10-CM

## 2014-02-01 DIAGNOSIS — R0602 Shortness of breath: Secondary | ICD-10-CM

## 2014-02-01 LAB — CBC WITH DIFFERENTIAL/PLATELET
BASO%: 0.6 % (ref 0.0–2.0)
Basophils Absolute: 0 10*3/uL (ref 0.0–0.1)
EOS%: 1.5 % (ref 0.0–7.0)
Eosinophils Absolute: 0.1 10*3/uL (ref 0.0–0.5)
HEMATOCRIT: 30.9 % — AB (ref 34.8–46.6)
HGB: 9.8 g/dL — ABNORMAL LOW (ref 11.6–15.9)
LYMPH%: 31.6 % (ref 14.0–49.7)
MCH: 27.4 pg (ref 25.1–34.0)
MCHC: 31.7 g/dL (ref 31.5–36.0)
MCV: 86.3 fL (ref 79.5–101.0)
MONO#: 0.2 10*3/uL (ref 0.1–0.9)
MONO%: 6.8 % (ref 0.0–14.0)
NEUT#: 1.9 10*3/uL (ref 1.5–6.5)
NEUT%: 59.5 % (ref 38.4–76.8)
PLATELETS: 314 10*3/uL (ref 145–400)
RBC: 3.58 10*6/uL — ABNORMAL LOW (ref 3.70–5.45)
RDW: 16.4 % — ABNORMAL HIGH (ref 11.2–14.5)
WBC: 3.2 10*3/uL — ABNORMAL LOW (ref 3.9–10.3)
lymph#: 1 10*3/uL (ref 0.9–3.3)

## 2014-02-01 LAB — COMPREHENSIVE METABOLIC PANEL (CC13)
ALT: 19 U/L (ref 0–55)
AST: 20 U/L (ref 5–34)
Albumin: 3.8 g/dL (ref 3.5–5.0)
Alkaline Phosphatase: 64 U/L (ref 40–150)
Anion Gap: 7 mEq/L (ref 3–11)
BILIRUBIN TOTAL: 0.43 mg/dL (ref 0.20–1.20)
BUN: 14 mg/dL (ref 7.0–26.0)
CO2: 29 mEq/L (ref 22–29)
Calcium: 9.2 mg/dL (ref 8.4–10.4)
Chloride: 106 mEq/L (ref 98–109)
Creatinine: 0.8 mg/dL (ref 0.6–1.1)
EGFR: >90 mL/min/{1.73_m2} (ref >90)
Glucose: 121 mg/dl (ref 70–140)
Potassium: 3.5 mEq/L (ref 3.5–5.1)
Sodium: 142 mEq/L (ref 136–145)
TOTAL PROTEIN: 7.2 g/dL (ref 6.4–8.3)

## 2014-02-01 MED ORDER — ACETAMINOPHEN 325 MG PO TABS
ORAL_TABLET | ORAL | Status: AC
  Filled 2014-02-01: qty 2

## 2014-02-01 MED ORDER — DIPHENHYDRAMINE HCL 25 MG PO CAPS
ORAL_CAPSULE | ORAL | Status: AC
  Filled 2014-02-01: qty 1

## 2014-02-01 MED ORDER — SODIUM CHLORIDE 0.9 % IJ SOLN
10.0000 mL | INTRAMUSCULAR | Status: DC | PRN
  Administered 2014-02-01: 10 mL
  Filled 2014-02-01: qty 10

## 2014-02-01 MED ORDER — ACETAMINOPHEN 325 MG PO TABS
650.0000 mg | ORAL_TABLET | Freq: Once | ORAL | Status: AC
  Administered 2014-02-01: 650 mg via ORAL

## 2014-02-01 MED ORDER — HEPARIN SOD (PORK) LOCK FLUSH 100 UNIT/ML IV SOLN
500.0000 [IU] | Freq: Once | INTRAVENOUS | Status: AC | PRN
  Administered 2014-02-01: 500 [IU]
  Filled 2014-02-01: qty 5

## 2014-02-01 MED ORDER — TRASTUZUMAB CHEMO INJECTION 440 MG
6.0000 mg/kg | Freq: Once | INTRAVENOUS | Status: AC
  Administered 2014-02-01: 525 mg via INTRAVENOUS
  Filled 2014-02-01: qty 25

## 2014-02-01 MED ORDER — SODIUM CHLORIDE 0.9 % IV SOLN
Freq: Once | INTRAVENOUS | Status: AC
  Administered 2014-02-01: 14:00:00 via INTRAVENOUS

## 2014-02-01 MED ORDER — DIPHENHYDRAMINE HCL 25 MG PO CAPS
50.0000 mg | ORAL_CAPSULE | Freq: Once | ORAL | Status: AC
  Administered 2014-02-01: 50 mg via ORAL

## 2014-02-01 NOTE — Assessment & Plan Note (Addendum)
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 Lucia Gaskins, received Taxol Herceptin on 10/04/2013 weekly x12, now on every 3 week Herceptin maintenance, radiation started December 15  Current treatment: Herceptin maintenance to complete and of August 2016. Shortness of breath: Slowly improving over time. Cardiology is following her and she had an echocardiogram which was reviewed and it was felt that we can continue on with Herceptin. 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  Return in 3 weeks for next Herceptin dosage and 6 weeks for clinic follow-up

## 2014-02-01 NOTE — Patient Instructions (Signed)
[facility] Discharge Instructions for Patients Receiving Chemotherapy  Today you received the following chemotherapy agents Herceptin  To help prevent nausea and vomiting after your treatment, we encourage you to take your nausea medication as directed/prescribed    If you develop nausea and vomiting that is not controlled by your nausea medication, call the clinic.   BELOW ARE SYMPTOMS THAT SHOULD BE REPORTED IMMEDIATELY:  *FEVER GREATER THAN 100.5 F  *CHILLS WITH OR WITHOUT FEVER  NAUSEA AND VOMITING THAT IS NOT CONTROLLED WITH YOUR NAUSEA MEDICATION  *UNUSUAL SHORTNESS OF BREATH  *UNUSUAL BRUISING OR BLEEDING  TENDERNESS IN MOUTH AND THROAT WITH OR WITHOUT PRESENCE OF ULCERS  *URINARY PROBLEMS  *BOWEL PROBLEMS  UNUSUAL RASH Items with * indicate a potential emergency and should be followed up as soon as possible.  Feel free to call the clinic you have any questions or concerns. The clinic phone number is [phone].

## 2014-02-01 NOTE — Progress Notes (Signed)
Patient Care Team: Gavin Pound, MD as PCP - General (Family Medicine) [name], MD as Consulting Physician (General Surgery) Thea Silversmith, MD as Consulting Physician (Radiation Oncology) Gavin Pound, MD as Consulting Physician (Family Medicine) Rulon Eisenmenger, MD as Consulting Physician (Hematology and Oncology)  DIAGNOSIS: Breast cancer of upper-outer quadrant of left female breast   Staging form: Breast, AJCC 7th Edition     Clinical: Stage IIA (T2, N0, cM0) - Signed by Rulon Eisenmenger, MD on 09/20/2013       Staging comments: Staged at breast conference 08/17/13.      Pathologic: T1c, N0, cM0 - Unsigned   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  The cancer was grade 2 and ER positive at 100%, PR+ at 91% and HER2- with a Ki67 of 47%   08/12/2013 Breast MRI 2.1 x 1.2 x 1.2 cm biopsy-proven invasive ductal carcinoma and ductal carcinoma in situ in the 9 o'clock position of the left breast   08/30/2013 Surgery Left Lumpectomy IDC with infiltrating lobular features. Pos for LVSI; 1 mm from nearest margin, DCIS 1 SLN Neg; ONCOTYPE Dx Rec score 17, 10 yr risk of recurrance is 11% with Tamoxifen alone   10/04/2013 - 12/27/2013 Chemotherapy Weekly Taxol/Herceptin x 12; Herceptin maintenance q. 3 weeks until end of August 2016    CHIEF COMPLIANT: Follow-up on Herceptin  INTERVAL HISTORY: Jean [name] is a 53 year old lady with above-mentioned history of left-sided breast cancer treated with lumpectomy and is currently on adjuvant chemotherapy with Herceptin maintenance. She is undergoing radiation therapy that started December 16. She reports no major problems or concerns.  Her energy levels are improving over time. Shortness of breath with exertion has been ongoing problem but it has slowly improved recently. Her hemoglobin has also improved. I suspect the shortness of breath was related to anemia.  REVIEW OF SYSTEMS:   Constitutional: Denies fevers, chills or abnormal weight loss Eyes: Denies blurriness of vision Ears, nose, mouth, throat, and face: Denies mucositis or sore throat Respiratory: Shortness of breath Cardiovascular: Denies palpitation, chest discomfort or lower extremity swelling Gastrointestinal:  Denies nausea, heartburn or change in bowel habits Skin: Denies abnormal skin rashes Lymphatics: Denies new lymphadenopathy or easy bruising Neurological:Denies numbness, tingling or new weaknesses Behavioral/Psych: Mood is stable, no new changes  Breast:  denies any pain or lumps or nodules in either breasts All other systems were reviewed with the patient and are negative.  I have reviewed the past medical history, past surgical history, social history and family history with the patient and they are unchanged from previous note.  ALLERGIES:  is allergic to hydrocodone.  MEDICATIONS:  Current Outpatient Prescriptions  Medication Sig Dispense Refill  . cholecalciferol (VITAMIN D) 1000 UNITS tablet Take 1,000 Units by mouth daily.    . Cyanocobalamin (VITAMIN B 12 PO) Take 1 tablet by mouth daily.    Marland Kitchen gabapentin (NEURONTIN) 300 MG capsule Take 1 capsule (300 mg total) by mouth at bedtime. 30 capsule 0  . hydrochlorothiazide (HYDRODIURIL) 12.5 MG tablet   0  . lidocaine-prilocaine (EMLA) cream Apply 1 application topically as needed. 30 g 3  . lisinopril-hydrochlorothiazide (PRINZIDE,ZESTORETIC) 10-12.5 MG per tablet Take 1 tablet by  mouth daily.  0  . LORazepam (ATIVAN) 0.5 MG tablet Take 1 tablet (0.5 mg total) by mouth every 6 (six) hours as needed (as needed for nausea and vomiting). 30 tablet 0  . Multiple Vitamin (MULTIVITAMIN) tablet Take 1  tablet by mouth daily.    Marland Kitchen omeprazole (PRILOSEC) 40 MG capsule Take 1 capsule (40 mg total) by mouth daily. 30 capsule 3  . oxyCODONE-acetaminophen (PERCOCET) 10-325 MG per tablet Take 1 tablet by mouth every 4 (four) hours as needed for pain. 30 tablet 0  . venlafaxine XR (EFFEXOR-XR) 37.5 MG 24 hr capsule TAKE ONE CAPSULE BY MOUTH EVERY DAY WITH BREAKFAST 30 capsule 0  . Wound Cleansers (RADIAPLEX EX) Apply topically.    Marland Kitchen zolpidem (AMBIEN) 5 MG tablet Take 5 mg by mouth at bedtime as needed for sleep.     No current facility-administered medications for this visit.    PHYSICAL EXAMINATION: ECOG PERFORMANCE STATUS: 1 - Symptomatic but completely ambulatory  Filed Vitals:   02/01/14 1341  BP: 139/90  Pulse: 87  Temp: 98.5 F (36.9 C)  Resp: 18   Filed Weights   02/01/14 1341  Weight: 197 lb 11.2 oz (89.676 kg)    GENERAL:alert, no distress and comfortable SKIN: skin color, texture, turgor are normal, no rashes or significant lesions EYES: normal, Conjunctiva are pink and non-injected, sclera clear OROPHARYNX:no exudate, no erythema and lips, buccal mucosa, and tongue normal  NECK: supple, thyroid normal size, non-tender, without nodularity LYMPH:  no palpable lymphadenopathy in the cervical, axillary or inguinal LUNGS: clear to auscultation and percussion with normal breathing effort HEART: regular rate & rhythm and no murmurs and no lower extremity edema ABDOMEN:abdomen soft, non-tender and normal bowel sounds Musculoskeletal:no cyanosis of digits and no clubbing  NEURO: alert & oriented x 3 with fluent speech, no focal motor/sensory deficits   LABORATORY DATA:  I have reviewed the data as listed   Chemistry      Component Value Date/Time   NA 139 01/12/2014 1027   NA 142 01/10/2014 1639   K 3.8 01/12/2014 1027   K 3.8 01/10/2014 1639   CL 101 01/10/2014 1639   CO2 26 01/12/2014 1027   CO2 28 01/10/2014 1639   BUN 14.8 01/12/2014 1027   BUN 12 01/10/2014 1639    CREATININE 0.8 01/12/2014 1027   CREATININE 0.80 01/10/2014 1639      Component Value Date/Time   CALCIUM 9.2 01/12/2014 1027   CALCIUM 10.0 01/10/2014 1639   ALKPHOS 71 01/12/2014 1027   ALKPHOS 73 01/10/2014 1639   AST 20 01/12/2014 1027   AST 19 01/10/2014 1639   ALT 21 01/12/2014 1027   ALT 18 01/10/2014 1639   BILITOT 0.26 01/12/2014 1027   BILITOT <0.2* 01/10/2014 1639       Lab Results  Component Value Date   WBC 3.2* 02/01/2014   HGB 9.8* 02/01/2014   HCT 30.9* 02/01/2014   MCV 86.3 02/01/2014   PLT 314 02/01/2014   NEUTROABS 1.9 02/01/2014    ASSESSMENT & PLAN:  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 Lucia Gaskins, received Taxol Herceptin on 10/04/2013 weekly x12, now on every 3 week Herceptin maintenance, radiation started December 15  Current treatment: Herceptin maintenance to complete and of August 2016. Shortness of  breath: Slowly improving over time. Cardiology is following her and she had an echocardiogram which was reviewed and it was felt that we can continue on with Herceptin. 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  Return in 3 weeks for next Herceptin dosage and 6 weeks for clinic follow-up   Orders Placed This Encounter  Procedures  . CBC with Differential    Standing Status: Future     Number of Occurrences:      Standing Expiration Date: 02/01/2015  . Comprehensive metabolic panel (Cmet) - [facility]    Standing Status: Future     Number of Occurrences:      Standing Expiration Date: 02/01/2015   The patient has a good understanding of the overall plan. she agrees with it. She will call with any  problems that may develop before her next visit here.   Rulon Eisenmenger, MD 02/01/2014 1:57 PM

## 2014-02-02 ENCOUNTER — Ambulatory Visit
Admission: RE | Admit: 2014-02-02 | Discharge: 2014-02-02 | Disposition: A | Discharge status: Home / Self Care | Payer: BLUE CROSS/BLUE SHIELD | Source: Ambulatory Visit | Attending: Radiation Oncology | Admitting: Radiation Oncology

## 2014-02-02 ENCOUNTER — Telehealth: Payer: Self-pay

## 2014-02-02 DIAGNOSIS — C50412 Malignant neoplasm of upper-outer quadrant of left female breast: Secondary | ICD-10-CM | POA: Diagnosis not present

## 2014-02-02 NOTE — Telephone Encounter (Signed)
Sent dispensing order to [facility] for L8015, l8000 and l8030.  Sent to scan.

## 2014-02-03 DIAGNOSIS — C50412 Malignant neoplasm of upper-outer quadrant of left female breast: Secondary | ICD-10-CM | POA: Diagnosis present

## 2014-02-06 ENCOUNTER — Ambulatory Visit
Admission: RE | Admit: 2014-02-06 | Discharge: 2014-02-06 | Disposition: A | Discharge status: Home / Self Care | Payer: BLUE CROSS/BLUE SHIELD | Source: Ambulatory Visit | Attending: Radiation Oncology | Admitting: Radiation Oncology

## 2014-02-06 ENCOUNTER — Telehealth: Payer: Self-pay | Admitting: *Deleted

## 2014-02-06 DIAGNOSIS — C50412 Malignant neoplasm of upper-outer quadrant of left female breast: Secondary | ICD-10-CM | POA: Diagnosis not present

## 2014-02-06 NOTE — Telephone Encounter (Signed)
I have adjusted 2/11 appt

## 2014-02-07 ENCOUNTER — Ambulatory Visit
Admission: RE | Admit: 2014-02-07 | Discharge: 2014-02-07 | Disposition: A | Discharge status: Home / Self Care | Payer: BLUE CROSS/BLUE SHIELD | Source: Ambulatory Visit | Attending: Radiation Oncology | Admitting: Radiation Oncology

## 2014-02-07 VITALS — BP 123/93 | HR 81 | Temp 98.8°F | Wt 195.4 lb

## 2014-02-07 DIAGNOSIS — C50412 Malignant neoplasm of upper-outer quadrant of left female breast: Secondary | ICD-10-CM

## 2014-02-07 NOTE — Progress Notes (Signed)
Weekly assessment of radiation to left breast.Completed 13 of 25 fractions.Denies pain.Skin mildly tanned.

## 2014-02-07 NOTE — Progress Notes (Signed)
Weekly Management Note Current Dose: 23.4  Gy  Projected Dose: 61 Gy   Narrative:  The patient presents for routine under treatment assessment.  CBCT/MVCT images/Port film x-rays were reviewed.  The chart was checked. Doing well. No complaints.   Physical Findings: Weight: 195 lb 6.4 oz (88.633 kg). Unchanged to very slightly dark.   Impression:  The patient is tolerating radiation.  Plan:  Continue treatment as planned. Continue radiaplex.

## 2014-02-08 ENCOUNTER — Encounter: Payer: Self-pay | Admitting: Radiation Oncology

## 2014-02-08 ENCOUNTER — Ambulatory Visit
Admission: RE | Admit: 2014-02-08 | Discharge: 2014-02-08 | Disposition: A | Discharge status: Home / Self Care | Payer: BLUE CROSS/BLUE SHIELD | Source: Ambulatory Visit | Attending: Radiation Oncology | Admitting: Radiation Oncology

## 2014-02-08 ENCOUNTER — Encounter (HOSPITAL_COMMUNITY): Payer: BC Managed Care – PPO

## 2014-02-08 DIAGNOSIS — C50412 Malignant neoplasm of upper-outer quadrant of left female breast: Secondary | ICD-10-CM | POA: Diagnosis not present

## 2014-02-08 NOTE — Progress Notes (Signed)
Name: Jean [name]   MRN: [id number]  Date:  02/08/2014   DOB: 1960-04-04  Status:outpatient    DIAGNOSIS: Left breast cancer  CONSENT VERIFIED: yes   SET UP: Patient is setup supine   IMMOBILIZATION:  The following immobilization was used:Custom Moldable Pillow, breast board.   NARRATIVE: [name] underwent complex simulation and treatment planning for her boost treatment today.  Her tumor volume was outlined on the planning CT scan. The depth of her cavity was felt to be appropriate for treatment with electrons     15 MeV electrons will be prescribed to the 100%  isodose line.   I personally oversaw and approved the construction of a unique block which will be used for beam modification purposes.  A special port plan is requested.

## 2014-02-09 ENCOUNTER — Ambulatory Visit
Admission: RE | Admit: 2014-02-09 | Discharge: 2014-02-09 | Disposition: A | Discharge status: Home / Self Care | Payer: BLUE CROSS/BLUE SHIELD | Source: Ambulatory Visit | Attending: Radiation Oncology | Admitting: Radiation Oncology

## 2014-02-09 DIAGNOSIS — C50412 Malignant neoplasm of upper-outer quadrant of left female breast: Secondary | ICD-10-CM | POA: Diagnosis not present

## 2014-02-10 ENCOUNTER — Ambulatory Visit
Admission: RE | Admit: 2014-02-10 | Discharge: 2014-02-10 | Disposition: A | Discharge status: Home / Self Care | Payer: BLUE CROSS/BLUE SHIELD | Source: Ambulatory Visit | Attending: Radiation Oncology | Admitting: Radiation Oncology

## 2014-02-10 DIAGNOSIS — C50412 Malignant neoplasm of upper-outer quadrant of left female breast: Secondary | ICD-10-CM | POA: Diagnosis not present

## 2014-02-13 ENCOUNTER — Ambulatory Visit
Admission: RE | Admit: 2014-02-13 | Discharge: 2014-02-13 | Disposition: A | Discharge status: Home / Self Care | Payer: BLUE CROSS/BLUE SHIELD | Source: Ambulatory Visit | Attending: Radiation Oncology | Admitting: Radiation Oncology

## 2014-02-13 DIAGNOSIS — C50412 Malignant neoplasm of upper-outer quadrant of left female breast: Secondary | ICD-10-CM | POA: Diagnosis not present

## 2014-02-14 ENCOUNTER — Other Ambulatory Visit: Payer: Self-pay

## 2014-02-14 ENCOUNTER — Ambulatory Visit
Admission: RE | Admit: 2014-02-14 | Discharge: 2014-02-14 | Disposition: A | Discharge status: Home / Self Care | Payer: BLUE CROSS/BLUE SHIELD | Source: Ambulatory Visit | Attending: Radiation Oncology | Admitting: Radiation Oncology

## 2014-02-14 VITALS — BP 134/98 | HR 87 | Temp 98.1°F | Wt 197.1 lb

## 2014-02-14 DIAGNOSIS — C50412 Malignant neoplasm of upper-outer quadrant of left female breast: Secondary | ICD-10-CM

## 2014-02-14 DIAGNOSIS — G629 Polyneuropathy, unspecified: Secondary | ICD-10-CM

## 2014-02-14 MED ORDER — GABAPENTIN 300 MG PO CAPS: 300.0000 mg | ORAL_CAPSULE | Freq: Every day | ORAL | Status: DC

## 2014-02-14 MED ORDER — VENLAFAXINE HCL ER 37.5 MG PO CP24: ORAL_CAPSULE | ORAL | Status: DC

## 2014-02-14 NOTE — Progress Notes (Signed)
  Radiation Oncology         [phone] ________________________________  Name: Jean [name] MRN: [id number]  Date: 02/14/2014  DOB: [birth year]  Weekly Radiation Therapy Management  Breast cancer of upper-outer quadrant of left female breast   Staging form: Breast, AJCC 7th Edition     Clinical: Stage IIA (T2, N0, cM0) - Signed by Rulon Eisenmenger, MD on 09/20/2013       Staging comments: Staged at breast conference 08/17/13.    Current Dose: 32.4 Gy     Planned Dose:  45+ Gy  Narrative . . . . . . . . The patient presents for routine under treatment assessment.                                   The patient is without complaint Center for one more soreness within the left breast area.                                  Set-up films were reviewed.                                 The chart was checked. Physical Findings. . .  weight is 197 lb 1.6 oz (89.404 kg). Her temperature is 98.1 F (36.7 C). Her blood pressure is 134/98 and her pulse is 87. . The lungs are clear. The heart has a regular rhythm and rate. The left breast area shows hyperpigmentation changes without any skin breakdown. Impression . . . . . . . The patient is tolerating radiation. Plan . . . . . . . . . . . . Continue treatment as planned.  ________________________________   Blair Promise, PhD, MD

## 2014-02-14 NOTE — Progress Notes (Signed)
Weekly assessment of radiation to left breast.Completed 18 of 25 fractions.Denies pain.Mild redness.Spoke with Terri RN in med/onc she will take care of sending in refill for effexxor and gabapentin.

## 2014-02-15 ENCOUNTER — Ambulatory Visit
Admission: RE | Admit: 2014-02-15 | Discharge: 2014-02-15 | Disposition: A | Discharge status: Home / Self Care | Payer: BLUE CROSS/BLUE SHIELD | Source: Ambulatory Visit | Attending: Radiation Oncology | Admitting: Radiation Oncology

## 2014-02-15 DIAGNOSIS — C50412 Malignant neoplasm of upper-outer quadrant of left female breast: Secondary | ICD-10-CM | POA: Diagnosis not present

## 2014-02-16 ENCOUNTER — Ambulatory Visit
Admission: RE | Admit: 2014-02-16 | Discharge: 2014-02-16 | Disposition: A | Discharge status: Home / Self Care | Payer: BLUE CROSS/BLUE SHIELD | Source: Ambulatory Visit | Attending: Radiation Oncology | Admitting: Radiation Oncology

## 2014-02-16 DIAGNOSIS — C50412 Malignant neoplasm of upper-outer quadrant of left female breast: Secondary | ICD-10-CM | POA: Diagnosis not present

## 2014-02-17 ENCOUNTER — Ambulatory Visit
Admission: RE | Admit: 2014-02-17 | Discharge: 2014-02-17 | Disposition: A | Discharge status: Home / Self Care | Payer: BLUE CROSS/BLUE SHIELD | Source: Ambulatory Visit | Attending: Radiation Oncology | Admitting: Radiation Oncology

## 2014-02-17 DIAGNOSIS — C50412 Malignant neoplasm of upper-outer quadrant of left female breast: Secondary | ICD-10-CM | POA: Diagnosis not present

## 2014-02-20 ENCOUNTER — Ambulatory Visit
Admission: RE | Admit: 2014-02-20 | Discharge: 2014-02-20 | Disposition: A | Discharge status: Home / Self Care | Payer: BLUE CROSS/BLUE SHIELD | Source: Ambulatory Visit | Attending: Radiation Oncology | Admitting: Radiation Oncology

## 2014-02-20 DIAGNOSIS — C50412 Malignant neoplasm of upper-outer quadrant of left female breast: Secondary | ICD-10-CM | POA: Diagnosis not present

## 2014-02-21 ENCOUNTER — Ambulatory Visit
Admission: RE | Admit: 2014-02-21 | Discharge: 2014-02-21 | Disposition: A | Discharge status: Home / Self Care | Payer: BLUE CROSS/BLUE SHIELD | Source: Ambulatory Visit | Attending: Radiation Oncology | Admitting: Radiation Oncology

## 2014-02-21 DIAGNOSIS — C50412 Malignant neoplasm of upper-outer quadrant of left female breast: Secondary | ICD-10-CM | POA: Diagnosis not present

## 2014-02-21 NOTE — Progress Notes (Signed)
Weekly Management Note Current Dose: 41.1  Gy  Projected Dose: 61 Gy   Narrative:  The patient presents for routine under treatment assessment.  CBCT/MVCT images/Port film x-rays were reviewed.  The chart was checked. Doing well. No complaints. Seen on tx machine to verify electron mark out.   Physical Findings: Weight:  . Slightly dark skin over left breast. No moist desquamation.   Impression:  The patient is tolerating radiation.  Plan:  Continue treatment as planned. Verified position of electron field on patient. OK to proceed. Wrote back to work note at patient request. Continue radiaplex.

## 2014-02-22 ENCOUNTER — Ambulatory Visit
Admission: RE | Admit: 2014-02-22 | Discharge: 2014-02-22 | Disposition: A | Discharge status: Home / Self Care | Payer: BLUE CROSS/BLUE SHIELD | Source: Ambulatory Visit | Attending: Radiation Oncology | Admitting: Radiation Oncology

## 2014-02-22 DIAGNOSIS — C50412 Malignant neoplasm of upper-outer quadrant of left female breast: Secondary | ICD-10-CM | POA: Diagnosis not present

## 2014-02-23 ENCOUNTER — Other Ambulatory Visit (HOSPITAL_BASED_OUTPATIENT_CLINIC_OR_DEPARTMENT_OTHER): Payer: BLUE CROSS/BLUE SHIELD

## 2014-02-23 ENCOUNTER — Ambulatory Visit (HOSPITAL_BASED_OUTPATIENT_CLINIC_OR_DEPARTMENT_OTHER): Payer: BLUE CROSS/BLUE SHIELD

## 2014-02-23 ENCOUNTER — Ambulatory Visit
Admission: RE | Admit: 2014-02-23 | Discharge: 2014-02-23 | Disposition: A | Discharge status: Home / Self Care | Payer: BLUE CROSS/BLUE SHIELD | Source: Ambulatory Visit | Attending: Radiation Oncology | Admitting: Radiation Oncology

## 2014-02-23 DIAGNOSIS — C50412 Malignant neoplasm of upper-outer quadrant of left female breast: Secondary | ICD-10-CM

## 2014-02-23 DIAGNOSIS — C50812 Malignant neoplasm of overlapping sites of left female breast: Secondary | ICD-10-CM

## 2014-02-23 DIAGNOSIS — Z5112 Encounter for antineoplastic immunotherapy: Secondary | ICD-10-CM

## 2014-02-23 LAB — CBC WITH DIFFERENTIAL/PLATELET
BASO%: 0.3 % (ref 0.0–2.0)
BASOS ABS: 0 10*3/uL (ref 0.0–0.1)
EOS ABS: 0.1 10*3/uL (ref 0.0–0.5)
EOS%: 1.9 % (ref 0.0–7.0)
HEMATOCRIT: 32.5 % — AB (ref 34.8–46.6)
HEMOGLOBIN: 10.1 g/dL — AB (ref 11.6–15.9)
LYMPH#: 0.8 10*3/uL — AB (ref 0.9–3.3)
LYMPH%: 24.9 % (ref 14.0–49.7)
MCH: 26.4 pg (ref 25.1–34.0)
MCHC: 31.1 g/dL — ABNORMAL LOW (ref 31.5–36.0)
MCV: 85.1 fL (ref 79.5–101.0)
MONO#: 0.2 10*3/uL (ref 0.1–0.9)
MONO%: 7.3 % (ref 0.0–14.0)
NEUT%: 65.6 % (ref 38.4–76.8)
NEUTROS ABS: 2.1 10*3/uL (ref 1.5–6.5)
PLATELETS: 340 10*3/uL (ref 145–400)
RBC: 3.82 10*6/uL (ref 3.70–5.45)
RDW: 14.8 % — ABNORMAL HIGH (ref 11.2–14.5)
WBC: 3.1 10*3/uL — ABNORMAL LOW (ref 3.9–10.3)

## 2014-02-23 LAB — COMPREHENSIVE METABOLIC PANEL (CC13)
ALBUMIN: 3.7 g/dL (ref 3.5–5.0)
ALT: 21 U/L (ref 0–55)
AST: 24 U/L (ref 5–34)
Alkaline Phosphatase: 70 U/L (ref 40–150)
Anion Gap: 5 mEq/L (ref 3–11)
BUN: 13.6 mg/dL (ref 7.0–26.0)
CALCIUM: 8.7 mg/dL (ref 8.4–10.4)
CO2: 26 mEq/L (ref 22–29)
CREATININE: 0.9 mg/dL (ref 0.6–1.1)
Chloride: 111 mEq/L — ABNORMAL HIGH (ref 98–109)
EGFR: 81 mL/min/{1.73_m2} — AB (ref >90)
Glucose: 114 mg/dl (ref 70–140)
Potassium: 3.9 mEq/L (ref 3.5–5.1)
SODIUM: 141 meq/L (ref 136–145)
Total Bilirubin: 0.21 mg/dL (ref 0.20–1.20)
Total Protein: 6.9 g/dL (ref 6.4–8.3)

## 2014-02-23 MED ORDER — DIPHENHYDRAMINE HCL 25 MG PO CAPS
50.0000 mg | ORAL_CAPSULE | Freq: Once | ORAL | Status: AC
  Administered 2014-02-23: 50 mg via ORAL

## 2014-02-23 MED ORDER — SODIUM CHLORIDE 0.9 % IJ SOLN
10.0000 mL | INTRAMUSCULAR | Status: DC | PRN
  Administered 2014-02-23: 10 mL
  Filled 2014-02-23: qty 10

## 2014-02-23 MED ORDER — DIPHENHYDRAMINE HCL 25 MG PO CAPS
ORAL_CAPSULE | ORAL | Status: AC
  Filled 2014-02-23: qty 2

## 2014-02-23 MED ORDER — SODIUM CHLORIDE 0.9 % IV SOLN
Freq: Once | INTRAVENOUS | Status: AC
  Administered 2014-02-23: 14:00:00 via INTRAVENOUS

## 2014-02-23 MED ORDER — ACETAMINOPHEN 325 MG PO TABS
ORAL_TABLET | ORAL | Status: AC
  Filled 2014-02-23: qty 2

## 2014-02-23 MED ORDER — SODIUM CHLORIDE 0.9 % IV SOLN
6.0000 mg/kg | Freq: Once | INTRAVENOUS | Status: AC
  Administered 2014-02-23: 525 mg via INTRAVENOUS
  Filled 2014-02-23: qty 25

## 2014-02-23 MED ORDER — HEPARIN SOD (PORK) LOCK FLUSH 100 UNIT/ML IV SOLN
500.0000 [IU] | Freq: Once | INTRAVENOUS | Status: AC | PRN
  Administered 2014-02-23: 500 [IU]
  Filled 2014-02-23: qty 5

## 2014-02-23 MED ORDER — ACETAMINOPHEN 325 MG PO TABS
650.0000 mg | ORAL_TABLET | Freq: Once | ORAL | Status: AC
Start: 2014-02-23 — End: 2014-02-23
  Administered 2014-02-23: 650 mg via ORAL

## 2014-02-23 NOTE — Patient Instructions (Signed)
[facility] Discharge Instructions for Patients Receiving Chemotherapy  Today you received the following chemotherapy agents Herceptin.  To help prevent nausea and vomiting after your treatment, we encourage you to take your nausea medication (None ordered) Call MD if needed.   If you develop nausea and vomiting that is not controlled by your nausea medication, call the clinic.   BELOW ARE SYMPTOMS THAT SHOULD BE REPORTED IMMEDIATELY:  *FEVER GREATER THAN 100.5 F  *CHILLS WITH OR WITHOUT FEVER  NAUSEA AND VOMITING THAT IS NOT CONTROLLED WITH YOUR NAUSEA MEDICATION  *UNUSUAL SHORTNESS OF BREATH  *UNUSUAL BRUISING OR BLEEDING  TENDERNESS IN MOUTH AND THROAT WITH OR WITHOUT PRESENCE OF ULCERS  *URINARY PROBLEMS  *BOWEL PROBLEMS  UNUSUAL RASH Items with * indicate a potential emergency and should be followed up as soon as possible.  Feel free to call the clinic you have any questions or concerns. The clinic phone number is [phone].

## 2014-02-24 ENCOUNTER — Ambulatory Visit: Payer: BLUE CROSS/BLUE SHIELD

## 2014-02-27 ENCOUNTER — Ambulatory Visit: Payer: BLUE CROSS/BLUE SHIELD

## 2014-02-28 ENCOUNTER — Ambulatory Visit
Admission: RE | Admit: 2014-02-28 | Discharge: 2014-02-28 | Disposition: A | Discharge status: Home / Self Care | Payer: BLUE CROSS/BLUE SHIELD | Source: Ambulatory Visit | Attending: Radiation Oncology | Admitting: Radiation Oncology

## 2014-02-28 ENCOUNTER — Ambulatory Visit: Payer: BLUE CROSS/BLUE SHIELD

## 2014-02-28 VITALS — BP 142/94 | HR 87 | Temp 98.2°F | Wt 196.8 lb

## 2014-02-28 DIAGNOSIS — C50412 Malignant neoplasm of upper-outer quadrant of left female breast: Secondary | ICD-10-CM

## 2014-02-28 NOTE — Progress Notes (Signed)
Weekly assessment of radiation to left breast.Completed 26 treatments.Hyperperpigmentation of skin with a few areas of peeling of scabs.continue application of radiaplex.Breast tenderness.Increased fatigue.

## 2014-02-28 NOTE — Progress Notes (Signed)
Weekly Management Note Current Dose: 47  Gy  Projected Dose: 61 Gy   Narrative:  The patient presents for routine under treatment assessment.  CBCT/MVCT images/Port film x-rays were reviewed.  The chart was checked. Doing well. No complaints.   Physical Findings: Weight: 196 lb 12.8 oz (89.268 kg). Unchanged. Hyperpigmented left breast but skin is intact. Inframammary fold clear.   Impression:  The patient is tolerating radiation.  Plan:  Continue treatment as planned. Continue radiaplex.

## 2014-03-01 ENCOUNTER — Ambulatory Visit
Admission: RE | Admit: 2014-03-01 | Discharge: 2014-03-01 | Disposition: A | Discharge status: Home / Self Care | Payer: BLUE CROSS/BLUE SHIELD | Source: Ambulatory Visit | Attending: Radiation Oncology | Admitting: Radiation Oncology

## 2014-03-01 DIAGNOSIS — C50412 Malignant neoplasm of upper-outer quadrant of left female breast: Secondary | ICD-10-CM | POA: Diagnosis not present

## 2014-03-02 ENCOUNTER — Ambulatory Visit
Admission: RE | Admit: 2014-03-02 | Discharge: 2014-03-02 | Disposition: A | Discharge status: Home / Self Care | Payer: BLUE CROSS/BLUE SHIELD | Source: Ambulatory Visit | Attending: Radiation Oncology | Admitting: Radiation Oncology

## 2014-03-02 DIAGNOSIS — C50412 Malignant neoplasm of upper-outer quadrant of left female breast: Secondary | ICD-10-CM | POA: Diagnosis not present

## 2014-03-03 ENCOUNTER — Ambulatory Visit
Admission: RE | Admit: 2014-03-03 | Discharge: 2014-03-03 | Disposition: A | Discharge status: Home / Self Care | Payer: BLUE CROSS/BLUE SHIELD | Source: Ambulatory Visit | Attending: Radiation Oncology | Admitting: Radiation Oncology

## 2014-03-03 DIAGNOSIS — C50412 Malignant neoplasm of upper-outer quadrant of left female breast: Secondary | ICD-10-CM | POA: Diagnosis not present

## 2014-03-06 ENCOUNTER — Ambulatory Visit
Admission: RE | Admit: 2014-03-06 | Discharge: 2014-03-06 | Disposition: A | Discharge status: Home / Self Care | Payer: BLUE CROSS/BLUE SHIELD | Source: Ambulatory Visit | Attending: Radiation Oncology | Admitting: Radiation Oncology

## 2014-03-06 DIAGNOSIS — C50412 Malignant neoplasm of upper-outer quadrant of left female breast: Secondary | ICD-10-CM | POA: Diagnosis not present

## 2014-03-07 ENCOUNTER — Ambulatory Visit: Payer: BLUE CROSS/BLUE SHIELD

## 2014-03-07 ENCOUNTER — Ambulatory Visit
Admission: RE | Admit: 2014-03-07 | Discharge: 2014-03-07 | Disposition: A | Discharge status: Home / Self Care | Payer: BLUE CROSS/BLUE SHIELD | Source: Ambulatory Visit | Attending: Radiation Oncology | Admitting: Radiation Oncology

## 2014-03-07 DIAGNOSIS — C50412 Malignant neoplasm of upper-outer quadrant of left female breast: Secondary | ICD-10-CM | POA: Diagnosis not present

## 2014-03-08 ENCOUNTER — Ambulatory Visit: Payer: BLUE CROSS/BLUE SHIELD

## 2014-03-08 ENCOUNTER — Ambulatory Visit
Admission: RE | Admit: 2014-03-08 | Discharge: 2014-03-08 | Disposition: A | Discharge status: Home / Self Care | Payer: BLUE CROSS/BLUE SHIELD | Source: Ambulatory Visit | Attending: Radiation Oncology | Admitting: Radiation Oncology

## 2014-03-08 ENCOUNTER — Encounter: Payer: Self-pay | Admitting: Radiation Oncology

## 2014-03-08 VITALS — BP 143/102 | HR 79 | Temp 98.0°F | Resp 12 | Ht 66.0 in | Wt 196.0 lb

## 2014-03-08 DIAGNOSIS — C50412 Malignant neoplasm of upper-outer quadrant of left female breast: Secondary | ICD-10-CM

## 2014-03-08 NOTE — Progress Notes (Addendum)
Jean [name] has completed 32 fractions to her left breast.  She denies pain.  She reports fatigue.  The skin on her left breast has hyperpigmentation with healing areas under left arm and under her left breast.  She is using radiaplex twice a day.  She has been given a one month follow up card.  Her bp was elevated at 145/105 and 143/102.  She did take HCTZ today.

## 2014-03-08 NOTE — Progress Notes (Signed)
Weekly Management Note Current Dose: 59  Gy  Projected Dose: 61 Gy   Narrative:  The patient presents for routine under treatment assessment.  CBCT/MVCT images/Port film x-rays were reviewed.  The chart was checked. Doing well. Skin feeling better. Tired. Sees med onc next week for Herceptin.   Physical Findings: Weight: 196 lb (88.905 kg). Hyperpigmentation and dry desquamation over left breast.   Impression:  The patient is tolerating radiation.  Plan:  Continue treatment as planned. Continue radiaplex. Discussed post RT skin care as well as LiveSTrong and FYNN.

## 2014-03-09 ENCOUNTER — Ambulatory Visit: Payer: BLUE CROSS/BLUE SHIELD

## 2014-03-09 ENCOUNTER — Ambulatory Visit
Admission: RE | Admit: 2014-03-09 | Discharge: 2014-03-09 | Disposition: A | Discharge status: Home / Self Care | Payer: BLUE CROSS/BLUE SHIELD | Source: Ambulatory Visit | Attending: Radiation Oncology | Admitting: Radiation Oncology

## 2014-03-09 ENCOUNTER — Encounter: Payer: Self-pay | Admitting: Radiation Oncology

## 2014-03-09 DIAGNOSIS — C50412 Malignant neoplasm of upper-outer quadrant of left female breast: Secondary | ICD-10-CM | POA: Diagnosis not present

## 2014-03-13 NOTE — Progress Notes (Signed)
  Radiation Oncology         [phone] ________________________________  Name: Jean [name] MRN: [id number]  Date: 03/09/2014  DOB: 1960/07/27  End of Treatment Note  Diagnosis:   Invasive Ductal Carcinoma of the Left Breast  Indication for treatment:  Curative       Radiation treatment dates:   01/18/2014-03/09/2014  Site/dose:   Left breast with breath hold technique/ 45 Gy at 1.8 Gy per fraction x 25 fractions.  Left breast boost/ 16 Gy at 2 Gy per fraction x 8 fractions  Beams/energy:  Opposed tangents with reduced fields / 6 MV photons En Face/ 15 MeV electrons   Narrative: The patient tolerated radiation treatment relatively well.   She has hyperpigmentation and dry desquamation over the left breast.   Plan: The patient has completed radiation treatment. The patient will return to radiation oncology clinic for routine followup in one month. I advised them to call or return sooner if they have any questions or concerns related to their recovery or treatment.  ------------------------------------------------  Thea Silversmith, MD

## 2014-03-15 ENCOUNTER — Other Ambulatory Visit: Payer: Self-pay

## 2014-03-15 DIAGNOSIS — C50412 Malignant neoplasm of upper-outer quadrant of left female breast: Secondary | ICD-10-CM

## 2014-03-16 ENCOUNTER — Ambulatory Visit (HOSPITAL_BASED_OUTPATIENT_CLINIC_OR_DEPARTMENT_OTHER): Payer: BLUE CROSS/BLUE SHIELD

## 2014-03-16 ENCOUNTER — Other Ambulatory Visit: Payer: BC Managed Care – PPO

## 2014-03-16 ENCOUNTER — Ambulatory Visit (HOSPITAL_BASED_OUTPATIENT_CLINIC_OR_DEPARTMENT_OTHER): Payer: BLUE CROSS/BLUE SHIELD | Admitting: Hematology and Oncology

## 2014-03-16 ENCOUNTER — Other Ambulatory Visit (HOSPITAL_BASED_OUTPATIENT_CLINIC_OR_DEPARTMENT_OTHER): Payer: BLUE CROSS/BLUE SHIELD

## 2014-03-16 ENCOUNTER — Telehealth: Payer: Self-pay | Admitting: Hematology and Oncology

## 2014-03-16 VITALS — BP 147/106 | HR 85 | Temp 97.9°F | Resp 19 | Ht 66.0 in | Wt 194.5 lb

## 2014-03-16 DIAGNOSIS — C50412 Malignant neoplasm of upper-outer quadrant of left female breast: Secondary | ICD-10-CM

## 2014-03-16 DIAGNOSIS — Z5112 Encounter for antineoplastic immunotherapy: Secondary | ICD-10-CM

## 2014-03-16 DIAGNOSIS — C50812 Malignant neoplasm of overlapping sites of left female breast: Secondary | ICD-10-CM

## 2014-03-16 DIAGNOSIS — D6481 Anemia due to antineoplastic chemotherapy: Secondary | ICD-10-CM

## 2014-03-16 DIAGNOSIS — D72818 Other decreased white blood cell count: Secondary | ICD-10-CM

## 2014-03-16 LAB — CBC WITH DIFFERENTIAL/PLATELET
BASO%: 0.9 % (ref 0.0–2.0)
BASOS ABS: 0 10*3/uL (ref 0.0–0.1)
EOS%: 1.6 % (ref 0.0–7.0)
Eosinophils Absolute: 0 10*3/uL (ref 0.0–0.5)
HCT: 33.3 % — ABNORMAL LOW (ref 34.8–46.6)
HGB: 10.5 g/dL — ABNORMAL LOW (ref 11.6–15.9)
LYMPH#: 0.8 10*3/uL — AB (ref 0.9–3.3)
LYMPH%: 28.4 % (ref 14.0–49.7)
MCH: 26 pg (ref 25.1–34.0)
MCHC: 31.6 g/dL (ref 31.5–36.0)
MCV: 82.4 fL (ref 79.5–101.0)
MONO#: 0.3 10*3/uL (ref 0.1–0.9)
MONO%: 10.5 % (ref 0.0–14.0)
NEUT#: 1.7 10*3/uL (ref 1.5–6.5)
NEUT%: 58.6 % (ref 38.4–76.8)
Platelets: 377 10*3/uL (ref 145–400)
RBC: 4.04 10*6/uL (ref 3.70–5.45)
RDW: 15.2 % — ABNORMAL HIGH (ref 11.2–14.5)
WBC: 2.9 10*3/uL — AB (ref 3.9–10.3)

## 2014-03-16 LAB — COMPREHENSIVE METABOLIC PANEL (CC13)
ALK PHOS: 74 U/L (ref 40–150)
ALT: 18 U/L (ref 0–55)
ANION GAP: 10 meq/L (ref 3–11)
AST: 21 U/L (ref 5–34)
Albumin: 3.8 g/dL (ref 3.5–5.0)
BILIRUBIN TOTAL: 0.2 mg/dL (ref 0.20–1.20)
BUN: 11.5 mg/dL (ref 7.0–26.0)
CHLORIDE: 104 meq/L (ref 98–109)
CO2: 27 mEq/L (ref 22–29)
CREATININE: 0.8 mg/dL (ref 0.6–1.1)
Calcium: 9.6 mg/dL (ref 8.4–10.4)
EGFR: >90 mL/min/{1.73_m2} (ref >90)
Glucose: 97 mg/dl (ref 70–140)
POTASSIUM: 3.7 meq/L (ref 3.5–5.1)
SODIUM: 141 meq/L (ref 136–145)
TOTAL PROTEIN: 7.4 g/dL (ref 6.4–8.3)

## 2014-03-16 MED ORDER — ANASTROZOLE 1 MG PO TABS: 1.0000 mg | ORAL_TABLET | Freq: Every day | ORAL | Status: DC

## 2014-03-16 MED ORDER — SODIUM CHLORIDE 0.9 % IV SOLN
Freq: Once | INTRAVENOUS | Status: AC
  Administered 2014-03-16: 15:00:00 via INTRAVENOUS

## 2014-03-16 MED ORDER — ACETAMINOPHEN 325 MG PO TABS
650.0000 mg | ORAL_TABLET | Freq: Once | ORAL | Status: AC
  Administered 2014-03-16: 650 mg via ORAL

## 2014-03-16 MED ORDER — TRASTUZUMAB CHEMO INJECTION 440 MG
6.0000 mg/kg | Freq: Once | INTRAVENOUS | Status: AC
  Administered 2014-03-16: 525 mg via INTRAVENOUS
  Filled 2014-03-16: qty 25

## 2014-03-16 MED ORDER — ACETAMINOPHEN 325 MG PO TABS
ORAL_TABLET | ORAL | Status: AC
  Filled 2014-03-16: qty 2

## 2014-03-16 MED ORDER — DIPHENHYDRAMINE HCL 25 MG PO CAPS
ORAL_CAPSULE | ORAL | Status: AC
  Filled 2014-03-16: qty 2

## 2014-03-16 MED ORDER — GABAPENTIN 300 MG PO CAPS: 300.0000 mg | ORAL_CAPSULE | Freq: Every day | ORAL | Status: DC

## 2014-03-16 MED ORDER — HEPARIN SOD (PORK) LOCK FLUSH 100 UNIT/ML IV SOLN
500.0000 [IU] | Freq: Once | INTRAVENOUS | Status: AC | PRN
  Administered 2014-03-16: 500 [IU]
  Filled 2014-03-16: qty 5

## 2014-03-16 MED ORDER — DIPHENHYDRAMINE HCL 25 MG PO CAPS
50.0000 mg | ORAL_CAPSULE | Freq: Once | ORAL | Status: AC
  Administered 2014-03-16: 50 mg via ORAL

## 2014-03-16 MED ORDER — SODIUM CHLORIDE 0.9 % IJ SOLN
10.0000 mL | INTRAMUSCULAR | Status: DC | PRN
  Administered 2014-03-16: 10 mL
  Filled 2014-03-16: qty 10

## 2014-03-16 NOTE — Progress Notes (Signed)
Patient Care Team: Gavin Pound, MD as PCP - General (Family Medicine) [name], MD as Consulting Physician (General Surgery) Thea Silversmith, MD as Consulting Physician (Radiation Oncology) Gavin Pound, MD as Consulting Physician (Family Medicine) Rulon Eisenmenger, MD as Consulting Physician (Hematology and Oncology)  DIAGNOSIS: Breast cancer of upper-outer quadrant of left female breast   Staging form: Breast, AJCC 7th Edition     Clinical: Stage IIA (T2, N0, cM0) - Signed by Rulon Eisenmenger, MD on 09/20/2013       Staging comments: Staged at breast conference 08/17/13.      Pathologic: T1c, N0, cM0 - Unsigned   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  The cancer was grade 2 and ER positive at 100%, PR+ at 91% and HER2- with a Ki67 of 47%   08/12/2013 Breast MRI 2.1 x 1.2 x 1.2 cm biopsy-proven invasive ductal carcinoma and ductal carcinoma in situ in the 9 o'clock position of the left breast   08/30/2013 Surgery Left Lumpectomy IDC with infiltrating lobular features. Pos for LVSI; 1 mm from nearest margin, DCIS 1 SLN Neg; ONCOTYPE Dx Rec score 17, 10 yr risk of recurrance is 11% with Tamoxifen alone   10/04/2013 - 12/27/2013 Chemotherapy Weekly Taxol/Herceptin x 12; Herceptin maintenance q. 3 weeks until end of August 2016   01/17/2014 - 03/09/2014 Radiation Therapy Adjuvant radiation therapy   03/16/2014 -  Anti-estrogen oral therapy anastrozole 1 mg daily to start 03/30/2014 plan is for 5 years    CHIEF COMPLIANT: Herceptin maintenance  INTERVAL HISTORY: Jean [name] is a 54 year old lady with above-mentioned history of left breast cancer treated with left lumpectomy  followed by chemotherapy with Taxol Herceptin. She is currently on Herceptin maintenance and appears to be tolerating it very well without any major problems. She underwent radiation therapy and feels fairly tired.but she started go back to work part-time. Possible back to full-time job in the next week. She still feels tired and fatigued.  REVIEW OF SYSTEMS:   Constitutional: Denies fevers, chills or abnormal weight loss Eyes: Denies blurriness of vision Ears, nose, mouth, throat, and face: Denies mucositis or sore throat Respiratory: Denies cough, dyspnea or wheezes Cardiovascular: Denies palpitation, chest discomfort or lower extremity swelling Gastrointestinal:  Denies nausea, heartburn or change in bowel habits Skin: Denies abnormal skin rashes Lymphatics: Denies new lymphadenopathy or easy bruising Neurological:Denies numbness, tingling or new weaknesses Behavioral/Psych: Mood is stable, no new changes  Breast:  denies any pain or lumps or nodules in either breasts All other systems were reviewed with the patient and are negative.  I have reviewed the past medical history, past surgical history, social history and family history with the patient and they are unchanged from previous note.  ALLERGIES:  is allergic to hydrocodone.  MEDICATIONS:  Current Outpatient Prescriptions  Medication Sig Dispense Refill  . cholecalciferol (VITAMIN D) 1000 UNITS tablet Take 1,000 Units by mouth daily.    . Cyanocobalamin (VITAMIN B 12 PO) Take 1 tablet by mouth daily.    Marland Kitchen gabapentin (NEURONTIN) 300 MG capsule Take 1 capsule (300 mg total) by mouth at bedtime. 90 capsule 3  . hydrochlorothiazide (HYDRODIURIL) 12.5 MG tablet   0  .  lidocaine-prilocaine (EMLA) cream Apply 1 application topically as needed. 30 g 3  . lisinopril-hydrochlorothiazide (PRINZIDE,ZESTORETIC) 10-12.5 MG per tablet Take 1 tablet by mouth daily.  0  . Multiple Vitamin (MULTIVITAMIN) tablet Take 1 tablet by mouth daily.    Marland Kitchen  omeprazole (PRILOSEC) 40 MG capsule Take 1 capsule (40 mg total) by mouth daily. 30 capsule 3  . venlafaxine XR (EFFEXOR-XR) 37.5 MG 24 hr capsule TAKE ONE CAPSULE BY MOUTH EVERY DAY WITH BREAKFAST 30 capsule 0  . Wound Cleansers (RADIAPLEX EX) Apply topically.    Marland Kitchen zolpidem (AMBIEN) 5 MG tablet Take 5 mg by mouth at bedtime as needed for sleep.    Marland Kitchen anastrozole (ARIMIDEX) 1 MG tablet Take 1 tablet (1 mg total) by mouth daily. 90 tablet 3   No current facility-administered medications for this visit.    PHYSICAL EXAMINATION: ECOG PERFORMANCE STATUS: 1 - Symptomatic but completely ambulatory  Filed Vitals:   03/16/14 1358  BP: 147/106  Pulse: 85  Temp: 97.9 F (36.6 C)  Resp: 19   Filed Weights   03/16/14 1358  Weight: 194 lb 8 oz (88.225 kg)    GENERAL:alert, no distress and comfortable SKIN: skin color, texture, turgor are normal, no rashes or significant lesions EYES: normal, Conjunctiva are pink and non-injected, sclera clear OROPHARYNX:no exudate, no erythema and lips, buccal mucosa, and tongue normal  NECK: supple, thyroid normal size, non-tender, without nodularity LYMPH:  no palpable lymphadenopathy in the cervical, axillary or inguinal LUNGS: clear to auscultation and percussion with normal breathing effort HEART: regular rate & rhythm and no murmurs and no lower extremity edema ABDOMEN:abdomen soft, non-tender and normal bowel sounds Musculoskeletal:no cyanosis of digits and no clubbing  NEURO: alert & oriented x 3 with fluent speech, no focal motor/sensory deficits  LABORATORY DATA:  I have reviewed the data as listed   Chemistry      Component Value Date/Time   NA 141 03/16/2014 1348   NA 142 01/10/2014 1639   K 3.7 03/16/2014 1348   K 3.8 01/10/2014 1639   CL 101 01/10/2014 1639   CO2 27 03/16/2014 1348   CO2 28 01/10/2014 1639   BUN 11.5 03/16/2014 1348   BUN 12 01/10/2014 1639   CREATININE 0.8 03/16/2014 1348   CREATININE 0.80 01/10/2014 1639       Component Value Date/Time   CALCIUM 9.6 03/16/2014 1348   CALCIUM 10.0 01/10/2014 1639   ALKPHOS 74 03/16/2014 1348   ALKPHOS 73 01/10/2014 1639   AST 21 03/16/2014 1348   AST 19 01/10/2014 1639   ALT 18 03/16/2014 1348   ALT 18 01/10/2014 1639   BILITOT 0.20 03/16/2014 1348   BILITOT <0.2* 01/10/2014 1639       Lab Results  Component Value Date   WBC 2.9* 03/16/2014   HGB 10.5* 03/16/2014   HCT 33.3* 03/16/2014   MCV 82.4 03/16/2014   PLT 377 03/16/2014   NEUTROABS 1.7 03/16/2014   ASSESSMENT & PLAN:  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 Lucia Gaskins, received Taxol Herceptin on 10/04/2013 weekly x12, now on every 3 week Herceptin maintenance, completed adjuvant radiation, starting tamoxifen 20 mg daily 03/16/2014  Current treatment: Herceptin maintenance until August 2016, since the patient is postmenopausal, she will start anastrozole 1 mg daily from 03/30/2014  plan is for 5 years.  Anastrozole counseling:We discussed the risks and benefits of anti-estrogen therapy with aromatase inhibitors. These include but not limited to insomnia, hot flashes, mood changes, vaginal dryness, bone density loss, and weight gain. Although rare, serious side effects including endometrial cancer, risk of blood clots were also discussed. We strongly believe that the benefits far outweigh the risks. Patient understands these risks and consented to starting treatment. Planned treatment duration is 5 years.  Anemia due to chemotherapy:hemoglobin is 10.5 today.normochromic normocytic improved from 10.1. We will continue to watch and monitor her. Shortness of breath:related to anemia. The symptoms are improving. 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 Leukopenia: WBC count 2.9,ANC 1700. Related to prior chemotherapy and recent radiation. We will continue to watch and monitor her.  Return to clinic in 6 weeks for follow-up to assess tolerability to Arimidex. No orders of the defined types were placed in this encounter.   The patient has a good understanding of the overall plan. she agrees with it. She will call with any problems that may develop before her next visit here.   Rulon Eisenmenger, MD

## 2014-03-16 NOTE — Patient Instructions (Signed)
[facility] Discharge Instructions for Patients Receiving Chemotherapy  Today you received the following chemotherapy agents Herceptin  To help prevent nausea and vomiting after your treatment, we encourage you to take your nausea medication     If you develop nausea and vomiting that is not controlled by your nausea medication, call the clinic.   BELOW ARE SYMPTOMS THAT SHOULD BE REPORTED IMMEDIATELY:  *FEVER GREATER THAN 100.5 F  *CHILLS WITH OR WITHOUT FEVER  NAUSEA AND VOMITING THAT IS NOT CONTROLLED WITH YOUR NAUSEA MEDICATION  *UNUSUAL SHORTNESS OF BREATH  *UNUSUAL BRUISING OR BLEEDING  TENDERNESS IN MOUTH AND THROAT WITH OR WITHOUT PRESENCE OF ULCERS  *URINARY PROBLEMS  *BOWEL PROBLEMS  UNUSUAL RASH Items with * indicate a potential emergency and should be followed up as soon as possible.  Feel free to call the clinic you have any questions or concerns. The clinic phone number is [phone].    

## 2014-03-16 NOTE — Assessment & Plan Note (Addendum)
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 Lucia Gaskins, received Taxol Herceptin on 10/04/2013 weekly x12, now on every 3 week Herceptin maintenance, completed adjuvant radiation, starting tamoxifen 20 mg daily 03/16/2014  Current treatment: Herceptin maintenance until August 2016, tamoxifen 20 mg daily started 03/16/2014 plan is for 10 years  Anemia due to chemotherapy: Shortness of breath:  Return to clinic in 6 weeks for follow-up to assess tolerability to tamoxifen.

## 2014-03-16 NOTE — Telephone Encounter (Signed)
, °

## 2014-04-05 ENCOUNTER — Other Ambulatory Visit: Payer: Self-pay

## 2014-04-05 DIAGNOSIS — C50412 Malignant neoplasm of upper-outer quadrant of left female breast: Secondary | ICD-10-CM

## 2014-04-06 ENCOUNTER — Encounter: Payer: Self-pay | Admitting: Radiation Oncology

## 2014-04-06 ENCOUNTER — Other Ambulatory Visit (HOSPITAL_BASED_OUTPATIENT_CLINIC_OR_DEPARTMENT_OTHER): Payer: BLUE CROSS/BLUE SHIELD

## 2014-04-06 ENCOUNTER — Other Ambulatory Visit: Payer: Self-pay | Admitting: *Deleted

## 2014-04-06 ENCOUNTER — Other Ambulatory Visit: Payer: BC Managed Care – PPO

## 2014-04-06 ENCOUNTER — Ambulatory Visit
Admission: RE | Admit: 2014-04-06 | Discharge: 2014-04-06 | Disposition: A | Discharge status: Home / Self Care | Payer: BLUE CROSS/BLUE SHIELD | Source: Ambulatory Visit | Attending: Radiation Oncology | Admitting: Radiation Oncology

## 2014-04-06 VITALS — BP 125/85 | HR 86 | Temp 97.7°F | Resp 20 | Ht 66.0 in | Wt 196.2 lb

## 2014-04-06 DIAGNOSIS — C50812 Malignant neoplasm of overlapping sites of left female breast: Secondary | ICD-10-CM

## 2014-04-06 DIAGNOSIS — C50412 Malignant neoplasm of upper-outer quadrant of left female breast: Secondary | ICD-10-CM

## 2014-04-06 DIAGNOSIS — D6481 Anemia due to antineoplastic chemotherapy: Secondary | ICD-10-CM

## 2014-04-06 DIAGNOSIS — D72818 Other decreased white blood cell count: Secondary | ICD-10-CM

## 2014-04-06 LAB — COMPREHENSIVE METABOLIC PANEL (CC13)
ALT: 14 U/L (ref 0–55)
ANION GAP: 10 meq/L (ref 3–11)
AST: 21 U/L (ref 5–34)
Albumin: 3.9 g/dL (ref 3.5–5.0)
Alkaline Phosphatase: 75 U/L (ref 40–150)
BUN: 15.4 mg/dL (ref 7.0–26.0)
CALCIUM: 9.7 mg/dL (ref 8.4–10.4)
CHLORIDE: 102 meq/L (ref 98–109)
CO2: 27 mEq/L (ref 22–29)
Creatinine: 0.9 mg/dL (ref 0.6–1.1)
Glucose: 110 mg/dl (ref 70–140)
Potassium: 3.5 mEq/L (ref 3.5–5.1)
Sodium: 139 mEq/L (ref 136–145)
Total Bilirubin: 0.22 mg/dL (ref 0.20–1.20)
Total Protein: 7.4 g/dL (ref 6.4–8.3)

## 2014-04-06 LAB — CBC WITH DIFFERENTIAL/PLATELET
BASO%: 0.7 % (ref 0.0–2.0)
BASOS ABS: 0 10*3/uL (ref 0.0–0.1)
EOS%: 2.6 % (ref 0.0–7.0)
Eosinophils Absolute: 0.1 10*3/uL (ref 0.0–0.5)
HEMATOCRIT: 34.1 % — AB (ref 34.8–46.6)
HGB: 11.1 g/dL — ABNORMAL LOW (ref 11.6–15.9)
LYMPH#: 0.9 10*3/uL (ref 0.9–3.3)
LYMPH%: 29.8 % (ref 14.0–49.7)
MCH: 26.5 pg (ref 25.1–34.0)
MCHC: 32.6 g/dL (ref 31.5–36.0)
MCV: 81.4 fL (ref 79.5–101.0)
MONO#: 0.2 10*3/uL (ref 0.1–0.9)
MONO%: 6.6 % (ref 0.0–14.0)
NEUT#: 1.8 10*3/uL (ref 1.5–6.5)
NEUT%: 60.3 % (ref 38.4–76.8)
PLATELETS: 334 10*3/uL (ref 145–400)
RBC: 4.19 10*6/uL (ref 3.70–5.45)
RDW: 14.2 % (ref 11.2–14.5)
WBC: 3 10*3/uL — AB (ref 3.9–10.3)

## 2014-04-06 NOTE — Progress Notes (Signed)
   Department of Radiation Oncology  Phone:  [phone] Fax:        [phone]   Name: Monik Lins MRN: [id number]  DOB: 05-13-1960  Date: 04/06/2014  Follow Up Visit Note  Diagnosis: Breast cancer of upper-outer quadrant of left female breast   Staging form: Breast, AJCC 7th Edition     Clinical: Stage IIA (T2, N0, cM0) - Signed by Rulon Eisenmenger, MD on 09/20/2013       Staging comments: Staged at breast conference 08/17/13.      Pathologic: T1c, N0, cM0 - Unsigned  Summary and Interval since last radiation: 1 month from 85 gy to the left breast completed 03/09/14  Interval History: Shan presents today for routine followup.  She is doing well. She gets herceptin today. She is pleased with her cosmetic result. She has gone back to work. She is on effexor and gabapentin for hot flashes which has worked well. She started arimidex. She is seeing Dr. Lindi Adie on 3/24.   Physical Exam:  Filed Vitals:   04/06/14 1310  BP: 125/85  Pulse: 86  Temp: 97.7 F (36.5 C)  TempSrc: Oral  Resp: 20  Height: 5\' 6"  (1.676 m)  Weight: 196 lb 3.2 oz (88.996 kg)   Skin is very well healed with minimal to no hyperpigmentation. She does have some volume loss.  IMPRESSION: Jean [name] is a 54 y.o. female s/p radiation with rsolving effects of treatment  PLAN:  She is doing well. We discussed the need for follow up every 4-6 months which she has scheduled.  We discussed the need for yearly mammograms which she can schedule with her OBGYN or with medical oncology. We discussed the need for sun protection in the treated area.  She can always call me with questions.  I will follow up with her on an as needed basis.   We discussed referrla to plastic surgery in the fall if she is concerned about asymmetry. She will discuss with Dr. Lindi Adie.     Thea Silversmith, MD

## 2014-04-06 NOTE — Progress Notes (Signed)
Follow up s/p left breast radiation 12/16-15-03/09/14, well healed breast,slight tanning, but has diminished stated patient, c/o slight itchiness at times, no pain, has started Arimidex 1mg  po daily, no hot flashes is on effoxor and neurotin, ,appetite good, slight tired, back working full time though, will get Herceptin infusion today, and q 3 weeks through 09/2014, f/u appt with Dr./ Lindi Adie 04/27/14 1:14 PM

## 2014-04-17 ENCOUNTER — Other Ambulatory Visit: Payer: Self-pay | Admitting: *Deleted

## 2014-04-17 DIAGNOSIS — G629 Polyneuropathy, unspecified: Secondary | ICD-10-CM

## 2014-04-17 MED ORDER — VENLAFAXINE HCL ER 37.5 MG PO CP24: ORAL_CAPSULE | ORAL | Status: DC

## 2014-04-27 ENCOUNTER — Ambulatory Visit (HOSPITAL_BASED_OUTPATIENT_CLINIC_OR_DEPARTMENT_OTHER): Payer: BLUE CROSS/BLUE SHIELD | Admitting: Hematology and Oncology

## 2014-04-27 ENCOUNTER — Ambulatory Visit (HOSPITAL_BASED_OUTPATIENT_CLINIC_OR_DEPARTMENT_OTHER): Payer: BLUE CROSS/BLUE SHIELD

## 2014-04-27 ENCOUNTER — Telehealth: Payer: Self-pay | Admitting: *Deleted

## 2014-04-27 ENCOUNTER — Telehealth: Payer: Self-pay | Admitting: Hematology and Oncology

## 2014-04-27 DIAGNOSIS — C50412 Malignant neoplasm of upper-outer quadrant of left female breast: Secondary | ICD-10-CM

## 2014-04-27 DIAGNOSIS — C50112 Malignant neoplasm of central portion of left female breast: Secondary | ICD-10-CM

## 2014-04-27 DIAGNOSIS — D6481 Anemia due to antineoplastic chemotherapy: Secondary | ICD-10-CM | POA: Diagnosis not present

## 2014-04-27 DIAGNOSIS — Z5112 Encounter for antineoplastic immunotherapy: Secondary | ICD-10-CM | POA: Diagnosis not present

## 2014-04-27 DIAGNOSIS — R5383 Other fatigue: Secondary | ICD-10-CM

## 2014-04-27 MED ORDER — ACETAMINOPHEN 325 MG PO TABS
ORAL_TABLET | ORAL | Status: AC
  Filled 2014-04-27: qty 2

## 2014-04-27 MED ORDER — HEPARIN SOD (PORK) LOCK FLUSH 100 UNIT/ML IV SOLN
500.0000 [IU] | Freq: Once | INTRAVENOUS | Status: AC | PRN
  Administered 2014-04-27: 500 [IU]
  Filled 2014-04-27: qty 5

## 2014-04-27 MED ORDER — SODIUM CHLORIDE 0.9 % IJ SOLN
10.0000 mL | INTRAMUSCULAR | Status: DC | PRN
  Administered 2014-04-27: 10 mL
  Filled 2014-04-27: qty 10

## 2014-04-27 MED ORDER — DIPHENHYDRAMINE HCL 25 MG PO CAPS
50.0000 mg | ORAL_CAPSULE | Freq: Once | ORAL | Status: AC
  Administered 2014-04-27: 50 mg via ORAL

## 2014-04-27 MED ORDER — SODIUM CHLORIDE 0.9 % IV SOLN: Freq: Once | INTRAVENOUS | Status: DC

## 2014-04-27 MED ORDER — DIPHENHYDRAMINE HCL 25 MG PO CAPS
ORAL_CAPSULE | ORAL | Status: AC
  Filled 2014-04-27: qty 2

## 2014-04-27 MED ORDER — TRASTUZUMAB CHEMO INJECTION 440 MG
6.0000 mg/kg | Freq: Once | INTRAVENOUS | Status: AC
  Administered 2014-04-27: 525 mg via INTRAVENOUS
  Filled 2014-04-27: qty 25

## 2014-04-27 MED ORDER — ACETAMINOPHEN 325 MG PO TABS
650.0000 mg | ORAL_TABLET | Freq: Once | ORAL | Status: AC
  Administered 2014-04-27: 650 mg via ORAL

## 2014-04-27 NOTE — Patient Instructions (Signed)
[facility] Discharge Instructions for Patients Receiving Chemotherapy  Today you received the following chemotherapy agents Herceptin   To help prevent nausea and vomiting after your treatment, we encourage you to take your nausea medication as directed/prescribed   If you develop nausea and vomiting that is not controlled by your nausea medication, call the clinic.   BELOW ARE SYMPTOMS THAT SHOULD BE REPORTED IMMEDIATELY:  *FEVER GREATER THAN 100.5 F  *CHILLS WITH OR WITHOUT FEVER  NAUSEA AND VOMITING THAT IS NOT CONTROLLED WITH YOUR NAUSEA MEDICATION  *UNUSUAL SHORTNESS OF BREATH  *UNUSUAL BRUISING OR BLEEDING  TENDERNESS IN MOUTH AND THROAT WITH OR WITHOUT PRESENCE OF ULCERS  *URINARY PROBLEMS  *BOWEL PROBLEMS  UNUSUAL RASH Items with * indicate a potential emergency and should be followed up as soon as possible.  Feel free to call the clinic you have any questions or concerns. The clinic phone number is [phone].  Please show the [facility] at check-in to the Emergency Department and triage nurse.

## 2014-04-27 NOTE — Telephone Encounter (Signed)
Per staff message and POF I have scheduled appts. Advised scheduler of appts. JMW  

## 2014-04-27 NOTE — Assessment & Plan Note (Signed)
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 Lucia Gaskins, received Taxol Herceptin on 10/04/2013 weekly x12, now on every 3 week Herceptin maintenance, completed adjuvant radiation, starting tamoxifen 20 mg daily 03/16/2014  Current treatment: Herceptin maintenance until August 2016,  Anastrozole 1 mg daily started 03/30/2014 plan is for 5 years.  Anastrozole toxicities: denies any hot flashes or myalgias. Anemia due to chemotherapy: hemoglobin has normalized Fatigue improving:  Patient is able to work full-time but  His started the end of the day.  continue Herceptin every 3 weeks area I will see her back in 6 weeks for follow-up.

## 2014-04-27 NOTE — Telephone Encounter (Signed)
per pof top sch pt appt-sent MW email to sch trmt-pt aware of appts-adv will call once reply

## 2014-04-27 NOTE — Progress Notes (Signed)
Patient Care Team: Gavin Pound, MD as PCP - General (Family Medicine) [name], MD as Consulting Physician (General Surgery) Thea Silversmith, MD as Consulting Physician (Radiation Oncology) Gavin Pound, MD as Consulting Physician (Family Medicine) Nicholas Lose, MD as Consulting Physician (Hematology and Oncology)  DIAGNOSIS: Breast cancer of upper-outer quadrant of left female breast   Staging form: Breast, AJCC 7th Edition     Clinical: Stage IIA (T2, N0, cM0) - Signed by Rulon Eisenmenger, MD on 09/20/2013       Staging comments: Staged at breast conference 08/17/13.      Pathologic: T1c, N0, cM0 - Unsigned   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  The cancer was grade 2 and ER positive at 100%, PR+ at 91% and HER2- with a Ki67 of 47%   08/12/2013 Breast MRI 2.1 x 1.2 x 1.2 cm biopsy-proven invasive ductal carcinoma and ductal carcinoma in situ in the 9 o'clock position of the left breast   08/30/2013 Surgery Left Lumpectomy IDC with infiltrating lobular features. Pos for LVSI; 1 mm from nearest margin, DCIS 1 SLN Neg; ONCOTYPE Dx Rec score 17, 10 yr risk of recurrance is 11% with Tamoxifen alone   10/04/2013 - 12/27/2013 Chemotherapy Weekly Taxol/Herceptin x 12; Herceptin maintenance q. 3 weeks until end of August 2016   01/17/2014 - 03/09/2014 Radiation Therapy Adjuvant radiation therapy   03/16/2014 -  Anti-estrogen oral therapy anastrozole 1 mg daily to start 03/30/2014 plan is for 5 years    CHIEF COMPLIANT:  Follow-up on Herceptin and anastrozole  INTERVAL HISTORY: Jean [name] is a  54 year old lady with above-mentioned history of left breast cancer treated with  lumpectomy followed by adjuvant chemotherapy and radiation. She is currently on anastrozole 1 mg daily along with maintenance Herceptin. She is tolerating this treatment fairly well. She is back to work and is working full-time. Does complain of profound tiredness at the end of the day. She is beginning to walk for short distances and exercise.  REVIEW OF SYSTEMS:   Constitutional: Denies fevers, chills or abnormal weight loss Eyes: Denies blurriness of vision Ears, nose, mouth, throat, and face: Denies mucositis or sore throat Respiratory: Denies cough, dyspnea or wheezes Cardiovascular: Denies palpitation, chest discomfort or lower extremity swelling Gastrointestinal:  Denies nausea, heartburn or change in bowel habits Skin: Denies abnormal skin rashes Lymphatics: Denies new lymphadenopathy or easy bruising Neurological:Denies numbness, tingling or new weaknesses Behavioral/Psych: Mood is stable, no new changes  Breast:  denies any pain or lumps or nodules in either breasts;  Complains of itching sensation towards the back that she scratches constantly advised Neosporin ointment to it. There is no maculopapular changes to those areas. All other systems were reviewed with the patient and are negative.  I have reviewed the past medical history, past surgical history, social history and family history with the patient and they are unchanged from previous note.  ALLERGIES:  is allergic to hydrocodone.  MEDICATIONS:  Current Outpatient Prescriptions  Medication Sig Dispense Refill  . anastrozole (ARIMIDEX) 1 MG tablet Take 1 tablet (1 mg total) by mouth daily. 90 tablet 3  . cholecalciferol (VITAMIN D) 1000 UNITS tablet Take 1,000 Units by mouth daily.    Marland Kitchen  Cyanocobalamin (VITAMIN B 12 PO) Take 1 tablet by mouth daily.    Marland Kitchen gabapentin (NEURONTIN) 300 MG capsule Take 1 capsule (300 mg total) by mouth at bedtime. 90 capsule 3  . lidocaine-prilocaine (EMLA) cream Apply 1 application topically as  needed. 30 g 3  . lisinopril-hydrochlorothiazide (PRINZIDE,ZESTORETIC) 10-12.5 MG per tablet Take 1 tablet by mouth daily.  0  . Multiple Vitamin (MULTIVITAMIN) tablet Take 1 tablet by mouth daily.    Marland Kitchen omeprazole (PRILOSEC) 40 MG capsule Take 1 capsule (40 mg total) by mouth daily. 30 capsule 3  . venlafaxine XR (EFFEXOR-XR) 37.5 MG 24 hr capsule TAKE ONE CAPSULE BY MOUTH EVERY DAY WITH BREAKFAST 30 capsule 0  . Wound Cleansers (RADIAPLEX EX) Apply topically.    Marland Kitchen zolpidem (AMBIEN) 5 MG tablet Take 5 mg by mouth at bedtime as needed for sleep.     No current facility-administered medications for this visit.    PHYSICAL EXAMINATION: ECOG PERFORMANCE STATUS: 1 - Symptomatic but completely ambulatory  Filed Vitals:   04/27/14 1438  BP: 111/77  Pulse: 92  Temp: 98.1 F (36.7 C)  Resp: 20   Filed Weights   04/27/14 1438  Weight: 192 lb 1.6 oz (87.136 kg)    GENERAL:alert, no distress and comfortable SKIN: skin color, texture, turgor are normal, no rashes or significant lesions EYES: normal, Conjunctiva are pink and non-injected, sclera clear OROPHARYNX:no exudate, no erythema and lips, buccal mucosa, and tongue normal  NECK: supple, thyroid normal size, non-tender, without nodularity LYMPH:  no palpable lymphadenopathy in the cervical, axillary or inguinal LUNGS: clear to auscultation and percussion with normal breathing effort HEART: regular rate & rhythm and no murmurs and no lower extremity edema ABDOMEN:abdomen soft, non-tender and normal bowel sounds Musculoskeletal:no cyanosis of digits and no clubbing  NEURO: alert & oriented x 3 with fluent speech, no focal motor/sensory deficits  LABORATORY DATA:  I have reviewed the data as listed   Chemistry      Component Value Date/Time   NA 139 04/06/2014 1342   NA 142 01/10/2014 1639   K 3.5 04/06/2014 1342   K 3.8 01/10/2014 1639   CL 101 01/10/2014 1639   CO2 27 04/06/2014 1342   CO2 28 01/10/2014 1639   BUN 15.4  04/06/2014 1342   BUN 12 01/10/2014 1639   CREATININE 0.9 04/06/2014 1342   CREATININE 0.80 01/10/2014 1639      Component Value Date/Time   CALCIUM 9.7 04/06/2014 1342   CALCIUM 10.0 01/10/2014 1639   ALKPHOS 75 04/06/2014 1342   ALKPHOS 73 01/10/2014 1639   AST 21 04/06/2014 1342   AST 19 01/10/2014 1639   ALT 14 04/06/2014 1342   ALT 18 01/10/2014 1639   BILITOT 0.22 04/06/2014 1342   BILITOT <0.2* 01/10/2014 1639       Lab Results  Component Value Date   WBC 3.0* 04/06/2014   HGB 11.1* 04/06/2014   HCT 34.1* 04/06/2014   MCV 81.4 04/06/2014   PLT 334 04/06/2014   NEUTROABS 1.8 04/06/2014   ASSESSMENT & PLAN:  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 Lucia Gaskins, received Taxol Herceptin on 10/04/2013 weekly x12, now on every 3 week Herceptin maintenance, completed adjuvant radiation, starting tamoxifen 20 mg daily 03/16/2014  Current treatment: Herceptin maintenance until  August 2016,  Anastrozole 1 mg daily started 03/30/2014 plan is for 5 years.  Anastrozole toxicities: denies any hot flashes or myalgias. Anemia due to chemotherapy: hemoglobin  Improved to 11.1 Herceptin toxicities: we will need to obtain an echocardiogram to evaluate her heart function.  Fatigue improving:  Patient is able to work full-time but  His started the end of the day.  continue Herceptin every 3 weeks area I will see her back in 6 weeks for follow-up.    Orders Placed This Encounter  Procedures  . 2D Echocardiogram without contrast    Standing Status: Future     Number of Occurrences:      Standing Expiration Date: 04/27/2015    Scheduling Instructions:     PROVIDERS If this is NOT for EF then please REASON that  needs COMPLETE STUDY    Order Specific  Question:  Type of Echo    Answer:  Complete    Order Specific Question:  Reason for Exam    Answer:  On herceptin therapy    Order Specific Question:  Where should this test be performed    Answer:  [facility]   The patient has a good understanding of the overall plan. she agrees with it. She will call with any problems that may develop before her next visit here.   Rulon Eisenmenger, MD

## 2014-05-09 ENCOUNTER — Telehealth: Payer: Self-pay

## 2014-05-09 NOTE — Telephone Encounter (Signed)
Gave pt appt d/t for echo.  She requested number to reschedule - pt will call to change appt d/t.

## 2014-05-12 ENCOUNTER — Other Ambulatory Visit (HOSPITAL_COMMUNITY): Payer: BLUE CROSS/BLUE SHIELD

## 2014-05-16 ENCOUNTER — Ambulatory Visit (HOSPITAL_COMMUNITY): Payer: BLUE CROSS/BLUE SHIELD

## 2014-05-17 ENCOUNTER — Other Ambulatory Visit: Payer: Self-pay | Admitting: *Deleted

## 2014-05-17 DIAGNOSIS — C50412 Malignant neoplasm of upper-outer quadrant of left female breast: Secondary | ICD-10-CM

## 2014-05-18 ENCOUNTER — Encounter: Payer: Self-pay | Admitting: Nurse Practitioner

## 2014-05-18 ENCOUNTER — Ambulatory Visit (HOSPITAL_BASED_OUTPATIENT_CLINIC_OR_DEPARTMENT_OTHER): Payer: BLUE CROSS/BLUE SHIELD

## 2014-05-18 ENCOUNTER — Other Ambulatory Visit (HOSPITAL_BASED_OUTPATIENT_CLINIC_OR_DEPARTMENT_OTHER): Payer: BLUE CROSS/BLUE SHIELD

## 2014-05-18 ENCOUNTER — Ambulatory Visit (HOSPITAL_BASED_OUTPATIENT_CLINIC_OR_DEPARTMENT_OTHER): Payer: BLUE CROSS/BLUE SHIELD | Admitting: Nurse Practitioner

## 2014-05-18 VITALS — BP 97/65 | HR 77 | Temp 97.1°F | Resp 20

## 2014-05-18 DIAGNOSIS — C50412 Malignant neoplasm of upper-outer quadrant of left female breast: Secondary | ICD-10-CM

## 2014-05-18 DIAGNOSIS — Z5112 Encounter for antineoplastic immunotherapy: Secondary | ICD-10-CM | POA: Diagnosis not present

## 2014-05-18 DIAGNOSIS — R21 Rash and other nonspecific skin eruption: Secondary | ICD-10-CM | POA: Diagnosis not present

## 2014-05-18 LAB — CBC WITH DIFFERENTIAL/PLATELET
BASO%: 0.6 % (ref 0.0–2.0)
Basophils Absolute: 0 10*3/uL (ref 0.0–0.1)
EOS%: 5.5 % (ref 0.0–7.0)
Eosinophils Absolute: 0.2 10*3/uL (ref 0.0–0.5)
HCT: 32.9 % — ABNORMAL LOW (ref 34.8–46.6)
HGB: 10.7 g/dL — ABNORMAL LOW (ref 11.6–15.9)
LYMPH%: 35.7 % (ref 14.0–49.7)
MCH: 25.7 pg (ref 25.1–34.0)
MCHC: 32.4 g/dL (ref 31.5–36.0)
MCV: 79.5 fL (ref 79.5–101.0)
MONO#: 0.3 10*3/uL (ref 0.1–0.9)
MONO%: 9.9 % (ref 0.0–14.0)
NEUT#: 1.6 10*3/uL (ref 1.5–6.5)
NEUT%: 48.3 % (ref 38.4–76.8)
Platelets: 398 10*3/uL (ref 145–400)
RBC: 4.14 10*6/uL (ref 3.70–5.45)
RDW: 15.9 % — AB (ref 11.2–14.5)
WBC: 3.3 10*3/uL — ABNORMAL LOW (ref 3.9–10.3)
lymph#: 1.2 10*3/uL (ref 0.9–3.3)

## 2014-05-18 LAB — COMPREHENSIVE METABOLIC PANEL (CC13)
ALBUMIN: 3.9 g/dL (ref 3.5–5.0)
ALT: 20 U/L (ref 0–55)
AST: 23 U/L (ref 5–34)
Alkaline Phosphatase: 77 U/L (ref 40–150)
Anion Gap: 9 mEq/L (ref 3–11)
BUN: 18.6 mg/dL (ref 7.0–26.0)
CHLORIDE: 102 meq/L (ref 98–109)
CO2: 31 mEq/L — ABNORMAL HIGH (ref 22–29)
Calcium: 9.5 mg/dL (ref 8.4–10.4)
Creatinine: 1 mg/dL (ref 0.6–1.1)
EGFR: 75 mL/min/{1.73_m2} — ABNORMAL LOW (ref >90)
Glucose: 97 mg/dl (ref 70–140)
POTASSIUM: 4 meq/L (ref 3.5–5.1)
Sodium: 142 mEq/L (ref 136–145)
TOTAL PROTEIN: 7.4 g/dL (ref 6.4–8.3)
Total Bilirubin: <0.2 mg/dL (ref 0.20–1.20)

## 2014-05-18 MED ORDER — DIPHENHYDRAMINE HCL 25 MG PO CAPS
ORAL_CAPSULE | ORAL | Status: AC
  Filled 2014-05-18: qty 2

## 2014-05-18 MED ORDER — ACETAMINOPHEN 325 MG PO TABS
650.0000 mg | ORAL_TABLET | Freq: Once | ORAL | Status: AC
  Administered 2014-05-18: 650 mg via ORAL

## 2014-05-18 MED ORDER — HEPARIN SOD (PORK) LOCK FLUSH 100 UNIT/ML IV SOLN
500.0000 [IU] | Freq: Once | INTRAVENOUS | Status: AC | PRN
  Administered 2014-05-18: 500 [IU]
  Filled 2014-05-18: qty 5

## 2014-05-18 MED ORDER — DIPHENHYDRAMINE HCL 25 MG PO CAPS
50.0000 mg | ORAL_CAPSULE | Freq: Once | ORAL | Status: AC
  Administered 2014-05-18: 50 mg via ORAL

## 2014-05-18 MED ORDER — SODIUM CHLORIDE 0.9 % IJ SOLN
10.0000 mL | INTRAMUSCULAR | Status: DC | PRN
  Administered 2014-05-18: 10 mL
  Filled 2014-05-18: qty 10

## 2014-05-18 MED ORDER — ACETAMINOPHEN 325 MG PO TABS
ORAL_TABLET | ORAL | Status: AC
  Filled 2014-05-18: qty 2

## 2014-05-18 MED ORDER — TRASTUZUMAB CHEMO INJECTION 440 MG
6.0000 mg/kg | Freq: Once | INTRAVENOUS | Status: AC
  Administered 2014-05-18: 525 mg via INTRAVENOUS
  Filled 2014-05-18: qty 25

## 2014-05-18 MED ORDER — SODIUM CHLORIDE 0.9 % IV SOLN
Freq: Once | INTRAVENOUS | Status: AC
  Administered 2014-05-18: 15:00:00 via INTRAVENOUS

## 2014-05-18 NOTE — Assessment & Plan Note (Signed)
Patient completed her last chemotherapy in November 2015.  She completed her radiation therapy on 03/09/2014.  She continues to take anastrozole on a daily basis; as well as Herceptin infusions on an every three-week basis.  She is scheduled for an echo tomorrow 05/19/2014.  She is scheduled to return for her next labs, office visit, and Herceptin infusion on 06/08/2014.

## 2014-05-18 NOTE — Patient Instructions (Signed)
[facility] Discharge Instructions for Patients Receiving Chemotherapy  Today you received the following chemotherapy agents: herceptin  To help prevent nausea and vomiting after your treatment, we encourage you to take your nausea medication.  Take it as often as prescribed.     If you develop nausea and vomiting that is not controlled by your nausea medication, call the clinic. If it is after clinic hours your family physician or the after hours number for the clinic or go to the Emergency Department.   BELOW ARE SYMPTOMS THAT SHOULD BE REPORTED IMMEDIATELY:  *FEVER GREATER THAN 100.5 F  *CHILLS WITH OR WITHOUT FEVER  NAUSEA AND VOMITING THAT IS NOT CONTROLLED WITH YOUR NAUSEA MEDICATION  *UNUSUAL SHORTNESS OF BREATH  *UNUSUAL BRUISING OR BLEEDING  TENDERNESS IN MOUTH AND THROAT WITH OR WITHOUT PRESENCE OF ULCERS  *URINARY PROBLEMS  *BOWEL PROBLEMS  UNUSUAL RASH Items with * indicate a potential emergency and should be followed up as soon as possible.  Feel free to call the clinic you have any questions or concerns. The clinic phone number is [phone].   I have been informed and understand all the instructions given to me. I know to contact the clinic, my physician, or go to the Emergency Department if any problems should occur. I do not have any questions at this time, but understand that I may call the clinic during office hours   should I have any questions or need assistance in obtaining follow up care.    __________________________________________  _____________  __________ Signature of Patient or Authorized Representative            Date                   Time    __________________________________________ Nurse's Signature    

## 2014-05-18 NOTE — Progress Notes (Signed)
SYMPTOM MANAGEMENT CLINIC   HPI: Jean [name] 54 y.o. female diagnosed with breast cancer.  Patient is status post lumpectomy, chemotherapy, and radiation therapy.  Currently undergoing anastrozole therapy; as well as Herceptin infusions.  Patient complaining of pruritic rash to her mid back region for 1-2 weeks.  She denies any new medications, lotions, shampoos, or new foods.  She denies any other allergic-type symptoms whatsoever.  She denies any recent fevers or chills.   HPI  ROS  Past Medical History  Diagnosis Date  . GERD (gastroesophageal reflux disease)     no current med.  . Dental crown present   . Arthritis     back  . Breast cancer 08/2013    left  . Radiation 01/18/14-03/09/14    Left breast    Past Surgical History  Procedure Laterality Date  . Foot surgery Right   . Abdominal hysterectomy  12/16/2007  . Lumpectory left  August 30, 2013  . Portacath placement Right 09/26/2013    Procedure: INSERTION PORT-A-CATH;  Surgeon: Shann Medal, MD;  Location: [facility];  Service: General;  Laterality: Right;    has Breast cancer of upper-outer quadrant of left female breast; Chest pain of uncertain etiology; Anemia; Symptomatic anemia; and Rash on her problem list.    is allergic to hydrocodone.    Medication List       This list is accurate as of: 05/18/14  7:14 PM.  Always use your most recent med list.               anastrozole 1 MG tablet  Commonly known as:  ARIMIDEX  Take 1 tablet (1 mg total) by mouth daily.     cholecalciferol 1000 UNITS tablet  Commonly known as:  VITAMIN D  Take 1,000 Units by mouth daily.     gabapentin 300 MG capsule  Commonly known as:  NEURONTIN  Take 1 capsule (300 mg total) by mouth at bedtime.     lidocaine-prilocaine cream  Commonly known as:  EMLA  Apply 1 application topically as needed.     lisinopril-hydrochlorothiazide 10-12.5 MG per tablet  Commonly known as:  PRINZIDE,ZESTORETIC  Take 1 tablet by mouth  daily.     multivitamin tablet  Take 1 tablet by mouth daily.     omeprazole 40 MG capsule  Commonly known as:  PRILOSEC  Take 1 capsule (40 mg total) by mouth daily.     RADIAPLEX EX  Apply topically.     venlafaxine XR 37.5 MG 24 hr capsule  Commonly known as:  EFFEXOR-XR  TAKE ONE CAPSULE BY MOUTH EVERY DAY WITH BREAKFAST     VITAMIN B 12 PO  Take 1 tablet by mouth daily.     zolpidem 5 MG tablet  Commonly known as:  AMBIEN  Take 5 mg by mouth at bedtime as needed for sleep.         PHYSICAL EXAMINATION  Oncology Vitals 05/18/2014 04/27/2014 04/06/2014 03/16/2014 03/08/2014 03/08/2014 02/28/2014  Height - 168 cm 168 cm 168 cm - 168 cm -  Weight - 87.136 kg 88.996 kg 88.225 kg - 88.905 kg 89.268 kg  Weight (lbs) - 192 lbs 2 oz 196 lbs 3 oz 194 lbs 8 oz - 196 lbs 196 lbs 13 oz  BMI (kg/m2) - 31.01 kg/m2 31.67 kg/m2 31.39 kg/m2 - 31.64 kg/m2 -  Temp 97.1 98.1 97.7 97.9 - 98 98.2  Pulse 77 92 86 85 79 85 87  Resp 20 20 20 19  -  12 -  SpO2 99 97 - 98 - - -  BSA (m2) - 2.01 m2 2.04 m2 2.03 m2 - 2.03 m2 -   BP Readings from Last 3 Encounters:  05/18/14 97/65  04/27/14 111/77  04/06/14 125/85    Physical Exam  Constitutional: She is oriented to person, place, and time and well-developed, well-nourished, and in no distress.  HENT:  Head: Normocephalic and atraumatic.  Eyes: Conjunctivae and EOM are normal. Pupils are equal, round, and reactive to light. Right eye exhibits no discharge. Left eye exhibits no discharge. No scleral icterus.  Neck: Normal range of motion.  Pulmonary/Chest: Effort normal. No stridor. No respiratory distress.  Musculoskeletal: Normal range of motion.  Neurological: She is alert and oriented to person, place, and time.  Skin: Skin is warm. No rash noted. No erythema.  No visible rash on exam; but obvious areas of scratching with some scabbed areas.  No obvious infection noted on exam.  Psychiatric: Affect normal.  Nursing note and vitals  reviewed.   LABORATORY DATA:. Appointment on 05/18/2014  Component Date Value Ref Range Status  . WBC 05/18/2014 3.3* 3.9 - 10.3 10e3/uL Final  . NEUT# 05/18/2014 1.6  1.5 - 6.5 10e3/uL Final  . HGB 05/18/2014 10.7* 11.6 - 15.9 g/dL Final  . HCT 05/18/2014 32.9* 34.8 - 46.6 % Final  . Platelets 05/18/2014 398  145 - 400 10e3/uL Final  . MCV 05/18/2014 79.5  79.5 - 101.0 fL Final  . MCH 05/18/2014 25.7  25.1 - 34.0 pg Final  . MCHC 05/18/2014 32.4  31.5 - 36.0 g/dL Final  . RBC 05/18/2014 4.14  3.70 - 5.45 10e6/uL Final  . RDW 05/18/2014 15.9* 11.2 - 14.5 % Final  . lymph# 05/18/2014 1.2  0.9 - 3.3 10e3/uL Final  . MONO# 05/18/2014 0.3  0.1 - 0.9 10e3/uL Final  . Eosinophils Absolute 05/18/2014 0.2  0.0 - 0.5 10e3/uL Final  . Basophils Absolute 05/18/2014 0.0  0.0 - 0.1 10e3/uL Final  . NEUT% 05/18/2014 48.3  38.4 - 76.8 % Final  . LYMPH% 05/18/2014 35.7  14.0 - 49.7 % Final  . MONO% 05/18/2014 9.9  0.0 - 14.0 % Final  . EOS% 05/18/2014 5.5  0.0 - 7.0 % Final  . BASO% 05/18/2014 0.6  0.0 - 2.0 % Final  . Sodium 05/18/2014 142  136 - 145 mEq/L Final  . Potassium 05/18/2014 4.0  3.5 - 5.1 mEq/L Final  . Chloride 05/18/2014 102  98 - 109 mEq/L Final  . CO2 05/18/2014 31* 22 - 29 mEq/L Final  . Glucose 05/18/2014 97  70 - 140 mg/dl Final  . BUN 05/18/2014 18.6  7.0 - 26.0 mg/dL Final  . Creatinine 05/18/2014 1.0  0.6 - 1.1 mg/dL Final  . Total Bilirubin 05/18/2014 <0.20  0.20 - 1.20 mg/dL Final  . Alkaline Phosphatase 05/18/2014 77  40 - 150 U/L Final  . AST 05/18/2014 23  5 - 34 U/L Final  . ALT 05/18/2014 20  0 - 55 U/L Final  . Total Protein 05/18/2014 7.4  6.4 - 8.3 g/dL Final  . Albumin 05/18/2014 3.9  3.5 - 5.0 g/dL Final  . Calcium 05/18/2014 9.5  8.4 - 10.4 mg/dL Final  . Anion Gap 05/18/2014 9  3 - 11 mEq/L Final  . EGFR 05/18/2014 75* >90 ml/min/1.73 m2 Final   eGFR is calculated using the CKD-EPI Creatinine Equation (2009)     RADIOGRAPHIC STUDIES: No results  found.  ASSESSMENT/PLAN:    Breast  cancer of upper-outer quadrant of left female breast Patient completed her last chemotherapy in November 2015.  She completed her radiation therapy on 03/09/2014.  She continues to take anastrozole on a daily basis; as well as Herceptin infusions on an every three-week basis.  She is scheduled for an echo tomorrow 05/19/2014.  She is scheduled to return for her next labs, office visit, and Herceptin infusion on 06/08/2014.   Rash Patient complaining of rash to her mid back which is very pruritic for the past week or so.  She denies any new medications, lotions, shampoos, or new foods recently.  On exam-there is no rash visible; but there are RVS scratch marks and scabbing areas were patient has been scratching.  No evidence of infection.  Advised patient unable to determine what she may have specifically had a reaction to; but advised the use of Benadryl and Pepcid for any further rash or itching.  Also, advised patient to try antifungal cream if the Benadryl/Pepcid combo does not improve her symptoms.  Advised patient to call/return gradually to the emergency department for any worsening symptoms whatsoever.      Patient stated understanding of all instructions; and was in agreement with this plan of care. The patient knows to call the clinic with any problems, questions or concerns.   Review/collaboration with Dr. Lindi Adie regarding all aspects of patient's visit today.   Total time spent with patient was 15 minutes;  with greater than 75 percent of that time spent in face to face counseling regarding patient's symptoms,  and coordination of care and follow up.  Disclaimer: This note was dictated with voice recognition software. Similar sounding words can inadvertently be transcribed and may not be corrected upon review.   Drue Second, NP 05/18/2014

## 2014-05-18 NOTE — Assessment & Plan Note (Signed)
Patient complaining of rash to her mid back which is very pruritic for the past week or so.  She denies any new medications, lotions, shampoos, or new foods recently.  On exam-there is no rash visible; but there are RVS scratch marks and scabbing areas were patient has been scratching.  No evidence of infection.  Advised patient unable to determine what she may have specifically had a reaction to; but advised the use of Benadryl and Pepcid for any further rash or itching.  Also, advised patient to try antifungal cream if the Benadryl/Pepcid combo does not improve her symptoms.  Advised patient to call/return gradually to the emergency department for any worsening symptoms whatsoever.

## 2014-05-19 ENCOUNTER — Telehealth: Payer: Self-pay | Admitting: Nurse Practitioner

## 2014-05-19 ENCOUNTER — Ambulatory Visit (HOSPITAL_COMMUNITY): Payer: BLUE CROSS/BLUE SHIELD

## 2014-05-19 ENCOUNTER — Other Ambulatory Visit: Payer: Self-pay | Admitting: Hematology and Oncology

## 2014-05-19 NOTE — Telephone Encounter (Signed)
Called pt c/o follow up visit on 05/18/14, pt stated her rash was not doing any better also stated she was using benadryl cream on rash to L/R breast. Spoke with Selena Lesser, NP, she stated pt should be using Benadryl po 25mg ,she was advised to start using po benadryl. Pt verbalized understanding.

## 2014-05-22 NOTE — Telephone Encounter (Signed)
Last OV 06/5814.  Next OV 04/27/14.  Chart reviewed

## 2014-05-25 ENCOUNTER — Other Ambulatory Visit: Payer: Self-pay | Admitting: Hematology and Oncology

## 2014-06-07 ENCOUNTER — Other Ambulatory Visit: Payer: Self-pay

## 2014-06-07 DIAGNOSIS — C50412 Malignant neoplasm of upper-outer quadrant of left female breast: Secondary | ICD-10-CM

## 2014-06-07 NOTE — Assessment & Plan Note (Signed)
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 Lucia Gaskins, received Taxol Herceptin on 10/04/2013 weekly x12, now on every 3 week Herceptin maintenance, completed adjuvant radiation, starting tamoxifen 20 mg daily 03/16/2014  Current treatment: Herceptin maintenance until August 2016,  Anastrozole 1 mg daily started 03/30/2014 plan is for 5 years.  Anastrozole toxicities: denies any hot flashes or myalgias. Anemia due to chemotherapy: hemoglobin has normalized Fatigue improving:  Patient is able to work full-time but  His started the end of the day.  continue Herceptin every 3 weeks area I will see her back in 6 weeks for follow-up.

## 2014-06-08 ENCOUNTER — Telehealth: Payer: Self-pay | Admitting: Hematology and Oncology

## 2014-06-08 ENCOUNTER — Other Ambulatory Visit (HOSPITAL_BASED_OUTPATIENT_CLINIC_OR_DEPARTMENT_OTHER): Payer: BLUE CROSS/BLUE SHIELD

## 2014-06-08 ENCOUNTER — Ambulatory Visit (HOSPITAL_BASED_OUTPATIENT_CLINIC_OR_DEPARTMENT_OTHER): Payer: BLUE CROSS/BLUE SHIELD | Admitting: Hematology and Oncology

## 2014-06-08 ENCOUNTER — Ambulatory Visit (HOSPITAL_BASED_OUTPATIENT_CLINIC_OR_DEPARTMENT_OTHER): Payer: BLUE CROSS/BLUE SHIELD

## 2014-06-08 VITALS — BP 119/78 | HR 70 | Temp 97.9°F | Resp 18 | Ht 66.0 in | Wt 191.7 lb

## 2014-06-08 DIAGNOSIS — D6481 Anemia due to antineoplastic chemotherapy: Secondary | ICD-10-CM

## 2014-06-08 DIAGNOSIS — C50412 Malignant neoplasm of upper-outer quadrant of left female breast: Secondary | ICD-10-CM

## 2014-06-08 DIAGNOSIS — Z170 Estrogen receptor positive status [ER+]: Secondary | ICD-10-CM | POA: Diagnosis not present

## 2014-06-08 DIAGNOSIS — Z5112 Encounter for antineoplastic immunotherapy: Secondary | ICD-10-CM

## 2014-06-08 LAB — COMPREHENSIVE METABOLIC PANEL (CC13)
ALT: 14 U/L (ref 0–55)
AST: 17 U/L (ref 5–34)
Albumin: 3.7 g/dL (ref 3.5–5.0)
Alkaline Phosphatase: 72 U/L (ref 40–150)
Anion Gap: 13 mEq/L — ABNORMAL HIGH (ref 3–11)
BILIRUBIN TOTAL: 0.3 mg/dL (ref 0.20–1.20)
BUN: 20.5 mg/dL (ref 7.0–26.0)
CO2: 24 mEq/L (ref 22–29)
CREATININE: 0.8 mg/dL (ref 0.6–1.1)
Calcium: 9.2 mg/dL (ref 8.4–10.4)
Chloride: 106 mEq/L (ref 98–109)
GLUCOSE: 104 mg/dL (ref 70–140)
Potassium: 3.8 mEq/L (ref 3.5–5.1)
Sodium: 143 mEq/L (ref 136–145)
TOTAL PROTEIN: 7.2 g/dL (ref 6.4–8.3)

## 2014-06-08 LAB — CBC WITH DIFFERENTIAL/PLATELET
BASO%: 0.5 % (ref 0.0–2.0)
BASOS ABS: 0 10*3/uL (ref 0.0–0.1)
EOS%: 4.1 % (ref 0.0–7.0)
Eosinophils Absolute: 0.2 10*3/uL (ref 0.0–0.5)
HCT: 32 % — ABNORMAL LOW (ref 34.8–46.6)
HEMOGLOBIN: 10.4 g/dL — AB (ref 11.6–15.9)
LYMPH%: 31.5 % (ref 14.0–49.7)
MCH: 26.1 pg (ref 25.1–34.0)
MCHC: 32.5 g/dL (ref 31.5–36.0)
MCV: 80.4 fL (ref 79.5–101.0)
MONO#: 0.4 10*3/uL (ref 0.1–0.9)
MONO%: 9.6 % (ref 0.0–14.0)
NEUT%: 54.3 % (ref 38.4–76.8)
NEUTROS ABS: 2 10*3/uL (ref 1.5–6.5)
Platelets: 331 10*3/uL (ref 145–400)
RBC: 3.98 10*6/uL (ref 3.70–5.45)
RDW: 15.7 % — ABNORMAL HIGH (ref 11.2–14.5)
WBC: 3.7 10*3/uL — ABNORMAL LOW (ref 3.9–10.3)
lymph#: 1.2 10*3/uL (ref 0.9–3.3)

## 2014-06-08 MED ORDER — SODIUM CHLORIDE 0.9 % IV SOLN
Freq: Once | INTRAVENOUS | Status: AC
  Administered 2014-06-08: 12:00:00 via INTRAVENOUS

## 2014-06-08 MED ORDER — DIPHENHYDRAMINE HCL 25 MG PO CAPS
50.0000 mg | ORAL_CAPSULE | Freq: Once | ORAL | Status: AC
  Administered 2014-06-08: 50 mg via ORAL

## 2014-06-08 MED ORDER — ACETAMINOPHEN 325 MG PO TABS
650.0000 mg | ORAL_TABLET | Freq: Once | ORAL | Status: AC
  Administered 2014-06-08: 650 mg via ORAL

## 2014-06-08 MED ORDER — HEPARIN SOD (PORK) LOCK FLUSH 100 UNIT/ML IV SOLN
500.0000 [IU] | Freq: Once | INTRAVENOUS | Status: AC | PRN
  Administered 2014-06-08: 500 [IU]
  Filled 2014-06-08: qty 5

## 2014-06-08 MED ORDER — DIPHENHYDRAMINE HCL 25 MG PO CAPS
ORAL_CAPSULE | ORAL | Status: AC
  Filled 2014-06-08: qty 2

## 2014-06-08 MED ORDER — SODIUM CHLORIDE 0.9 % IJ SOLN
10.0000 mL | INTRAMUSCULAR | Status: DC | PRN
  Administered 2014-06-08: 10 mL
  Filled 2014-06-08: qty 10

## 2014-06-08 MED ORDER — ACETAMINOPHEN 325 MG PO TABS
ORAL_TABLET | ORAL | Status: AC
  Filled 2014-06-08: qty 2

## 2014-06-08 MED ORDER — TRASTUZUMAB CHEMO INJECTION 440 MG
6.0000 mg/kg | Freq: Once | INTRAVENOUS | Status: AC
  Administered 2014-06-08: 525 mg via INTRAVENOUS
  Filled 2014-06-08: qty 25

## 2014-06-08 NOTE — Progress Notes (Signed)
Patient Care Team: Gavin Pound, MD as PCP - General (Family Medicine) [name], MD as Consulting Physician (General Surgery) Thea Silversmith, MD as Consulting Physician (Radiation Oncology) Gavin Pound, MD as Consulting Physician (Family Medicine) Nicholas Lose, MD as Consulting Physician (Hematology and Oncology)  DIAGNOSIS: Breast cancer of upper-outer quadrant of left female breast   Staging form: Breast, AJCC 7th Edition     Clinical: Stage IIA (T2, N0, cM0) - Signed by Rulon Eisenmenger, MD on 09/20/2013       Staging comments: Staged at breast conference 08/17/13.      Pathologic: T1c, N0, cM0 - Unsigned   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  The cancer was grade 2 and ER positive at 100%, PR+ at 91% and HER2- with a Ki67 of 47%   08/12/2013 Breast MRI 2.1 x 1.2 x 1.2 cm biopsy-proven invasive ductal carcinoma and ductal carcinoma in situ in the 9 o'clock position of the left breast   08/30/2013 Surgery Left Lumpectomy IDC with infiltrating lobular features. Pos for LVSI; 1 mm from nearest margin, DCIS 1 SLN Neg; ONCOTYPE Dx Rec score 17, 10 yr risk of recurrance is 11% with Tamoxifen alone   10/04/2013 - 12/27/2013 Chemotherapy Weekly Taxol/Herceptin x 12; Herceptin maintenance q. 3 weeks until end of August 2016   01/17/2014 - 03/09/2014 Radiation Therapy Adjuvant radiation therapy   03/16/2014 -  Anti-estrogen oral therapy anastrozole 1 mg daily to start 03/30/2014 plan is for 5 years    CHIEF COMPLIANT: Follow-up of Herceptin and anastrozole  INTERVAL HISTORY: Jean [name] is a 53 year old lady with above-mentioned history of left breast cancer treated with  lumpectomy and adjuvant chemotherapy and radiation and is currently on anastrozole with Herceptin. She is tolerating anastrozole fairly well except for hot flashes. Since he started taking it at night her hot flashes during the day have improved significantly. She continues to feel fatigue but it is because she has been working overtime. She has not done her echocardiogram.  REVIEW OF SYSTEMS:   Constitutional: Denies fevers, chills or abnormal weight loss Eyes: Denies blurriness of vision Ears, nose, mouth, throat, and face: Denies mucositis or sore throat Respiratory: Denies cough, dyspnea or wheezes Cardiovascular: Denies palpitation, chest discomfort or lower extremity swelling Gastrointestinal:  Denies nausea, heartburn or change in bowel habits Skin: Denies abnormal skin rashes Lymphatics: Denies new lymphadenopathy or easy bruising Neurological:Denies numbness, tingling or new weaknesses Behavioral/Psych: Mood is stable, no new changes  Breast:  denies any pain or lumps or nodules in either breasts All other systems were reviewed with the patient and are negative.  I have reviewed the past medical history, past surgical history, social history and family history with the patient and they are unchanged from previous note.  ALLERGIES:  is allergic to hydrocodone.  MEDICATIONS:  Current Outpatient Prescriptions  Medication Sig Dispense Refill  . anastrozole (ARIMIDEX) 1 MG tablet Take 1 tablet (1 mg total) by mouth daily. 90 tablet 3  . cholecalciferol (VITAMIN D) 1000 UNITS tablet Take 1,000 Units by mouth daily.    . Cyanocobalamin (VITAMIN B 12 PO) Take 1 tablet by mouth daily.    Marland Kitchen gabapentin (NEURONTIN) 300 MG capsule Take  1 capsule (300 mg total) by mouth at bedtime. 90 capsule 3  . hydrochlorothiazide (HYDRODIURIL) 12.5 MG tablet   4  . lidocaine-prilocaine (EMLA) cream Apply 1 application topically as needed. 30 g 3  . lisinopril-hydrochlorothiazide (PRINZIDE,ZESTORETIC)  10-12.5 MG per tablet Take 1 tablet by mouth daily.  0  . Multiple Vitamin (MULTIVITAMIN) tablet Take 1 tablet by mouth daily.    Marland Kitchen omeprazole (PRILOSEC) 40 MG capsule Take 1 capsule (40 mg total) by mouth daily. 30 capsule 3  . venlafaxine XR (EFFEXOR-XR) 37.5 MG 24 hr capsule TAKE ONE CAPSULE BY MOUTH EVERY DAY WITH BREAKFAST 30 capsule 0  . Wound Cleansers (RADIAPLEX EX) Apply topically.    Marland Kitchen zolpidem (AMBIEN) 5 MG tablet Take 5 mg by mouth at bedtime as needed for sleep.     No current facility-administered medications for this visit.   Facility-Administered Medications Ordered in Other Visits  Medication Dose Route Frequency Provider Last Rate Last Dose  . heparin lock flush 100 unit/mL  500 Units Intracatheter Once PRN Nicholas Lose, MD      . sodium chloride 0.9 % injection 10 mL  10 mL Intracatheter PRN Nicholas Lose, MD      . trastuzumab (HERCEPTIN) 525 mg in sodium chloride 0.9 % 250 mL chemo infusion  6 mg/kg (Treatment Plan Actual) Intravenous Once Nicholas Lose, MD        PHYSICAL EXAMINATION: ECOG PERFORMANCE STATUS: 1 - Symptomatic but completely ambulatory  Filed Vitals:   06/08/14 1121  BP: 119/78  Pulse: 70  Temp: 97.9 F (36.6 C)  Resp: 18   Filed Weights   06/08/14 1121  Weight: 191 lb 11.2 oz (86.955 kg)    GENERAL:alert, no distress and comfortable SKIN: skin color, texture, turgor are normal, no rashes or significant lesions EYES: normal, Conjunctiva are pink and non-injected, sclera clear OROPHARYNX:no exudate, no erythema and lips, buccal mucosa, and tongue normal  NECK: supple, thyroid normal size, non-tender, without nodularity LYMPH:  no palpable lymphadenopathy in the cervical, axillary or inguinal LUNGS: clear to auscultation and percussion with normal breathing effort HEART: regular rate & rhythm and no murmurs and no lower extremity edema ABDOMEN:abdomen soft, non-tender and normal bowel sounds Musculoskeletal:no cyanosis of digits and no  clubbing  NEURO: alert & oriented x 3 with fluent speech, no focal motor/sensory deficits  LABORATORY DATA:  I have reviewed the data as listed   Chemistry      Component Value Date/Time   NA 143 06/08/2014 1101   NA 142 01/10/2014 1639   K 3.8 06/08/2014 1101   K 3.8 01/10/2014 1639   CL 101 01/10/2014 1639   CO2 24 06/08/2014 1101   CO2 28 01/10/2014 1639   BUN 20.5 06/08/2014 1101   BUN 12 01/10/2014 1639   CREATININE 0.8 06/08/2014 1101   CREATININE 0.80 01/10/2014 1639      Component Value Date/Time   CALCIUM 9.2 06/08/2014 1101   CALCIUM 10.0 01/10/2014 1639   ALKPHOS 72 06/08/2014 1101   ALKPHOS 73 01/10/2014 1639   AST 17 06/08/2014 1101   AST 19 01/10/2014 1639   ALT 14 06/08/2014 1101   ALT 18 01/10/2014 1639   BILITOT 0.30 06/08/2014 1101   BILITOT <0.2* 01/10/2014 1639       Lab Results  Component Value Date   WBC 3.7* 06/08/2014   HGB 10.4* 06/08/2014   HCT 32.0* 06/08/2014   MCV 80.4 06/08/2014   PLT 331 06/08/2014   NEUTROABS 2.0 06/08/2014  ASSESSMENT & PLAN:  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 Lucia Gaskins, received Taxol Herceptin on 10/04/2013 weekly x12, now on every 3 week Herceptin maintenance, completed adjuvant radiation, starting tamoxifen 20 mg daily 03/16/2014  Current treatment: Herceptin maintenance until August 2016,  Anastrozole 1 mg daily started 03/30/2014 plan is for 5 years.  Anastrozole toxicities:  1. Hot flashes 2. fatigue    Anemia due to chemotherapy:  Hemoglobin is 10.4 today. Fatigue:  Patient is able to work full-time and she is working overtime.  Plan: continue Herceptin every 3 weeks area I will see her back in 6 weeks for follow-up. Patient has not done echocardiogram in spite of  rescheduling it 3 times so far. I discussed with her the importance of getting an echocardiogram. She showed me that she will get it done very soon.   No orders of the defined types were placed in this encounter.   The patient has a good understanding of the overall plan. she agrees with it. she will call with any problems that may develop before the next visit here.   Rulon Eisenmenger, MD

## 2014-06-08 NOTE — Telephone Encounter (Signed)
Gave avs & calendar for May/JUne. Sent message to schedule treatment.

## 2014-06-08 NOTE — Telephone Encounter (Signed)
Appointments made per staff message  anne

## 2014-06-08 NOTE — Progress Notes (Signed)
OK to treat today with herceptin per Dr. Lindi Adie.

## 2014-06-13 ENCOUNTER — Telehealth: Payer: Self-pay | Admitting: *Deleted

## 2014-06-13 NOTE — Telephone Encounter (Signed)
Patient has developed a rash over this past month. Patient states that it does itch and painful due to irritation. Patient has tried different remedies to diminish rash and symptoms but it did help. Please advise. Message sent to MD and RN.   [phone] Jerilynn Mages)

## 2014-06-13 NOTE — Telephone Encounter (Signed)
Spoke with Dr. Lindi Adie. Left VMM for patient that she may use Hydrocortisone cream topically and take Benadryl 25-50 mg every 6 hours. Do this for about a week and if symptoms do not improve then call us back and she can be seen by Selena Lesser.

## 2014-06-14 ENCOUNTER — Telehealth: Payer: Self-pay | Admitting: *Deleted

## 2014-06-14 NOTE — Telephone Encounter (Signed)
VM message from patient regarding her rash. Call back to patient and spoke with her. She states that the rash has not gotten any better and that she has been using the hydrocortisone cream and benadryl for a little over a week without any improvement. Scheduled her to see Selena Lesser, NP tomorrow (06/15/14) @ 11am. No labs needed. Pt. verbalized understandingdi

## 2014-06-15 ENCOUNTER — Encounter: Payer: Self-pay | Admitting: Nurse Practitioner

## 2014-06-15 ENCOUNTER — Ambulatory Visit (HOSPITAL_BASED_OUTPATIENT_CLINIC_OR_DEPARTMENT_OTHER): Payer: BLUE CROSS/BLUE SHIELD | Admitting: Nurse Practitioner

## 2014-06-15 VITALS — BP 114/84 | HR 91 | Temp 98.3°F | Resp 18 | Wt 191.9 lb

## 2014-06-15 DIAGNOSIS — L853 Xerosis cutis: Secondary | ICD-10-CM | POA: Insufficient documentation

## 2014-06-15 DIAGNOSIS — L298 Other pruritus: Secondary | ICD-10-CM | POA: Diagnosis not present

## 2014-06-15 DIAGNOSIS — Z170 Estrogen receptor positive status [ER+]: Secondary | ICD-10-CM

## 2014-06-15 DIAGNOSIS — C50412 Malignant neoplasm of upper-outer quadrant of left female breast: Secondary | ICD-10-CM

## 2014-06-15 NOTE — Progress Notes (Signed)
SYMPTOM MANAGEMENT CLINIC   HPI: Jean [name] 54 y.o. female diagnosed with breast cancer.  Patient is status post left breast lumpectomy, chemotherapy, and radiation therapy.  Currently undergoing Herceptin infusions and anastrozole oral therapy.   Patient is status post left breast lumpectomy, chemotherapy, completed in November 2015, and radiation therapy completed in February 2016.  She continues to undergo Herceptin infusions on an every three-week basis.  She also continues to take anastrozole on a daily basis.  Patient is complaining of extremely dry, pruritic skin to her bilateral mid to upper back directly underneath her bra for approximately one month.  Patient states that she has been using Benadryl cream topically to the sites; with minimal effectiveness.  HPI  ROS  Past Medical History  Diagnosis Date  . GERD (gastroesophageal reflux disease)     no current med.  . Dental crown present   . Arthritis     back  . Breast cancer 08/2013    left  . Radiation 01/18/14-03/09/14    Left breast    Past Surgical History  Procedure Laterality Date  . Foot surgery Right   . Abdominal hysterectomy  12/16/2007  . Lumpectory left  August 30, 2013  . Portacath placement Right 09/26/2013    Procedure: INSERTION PORT-A-CATH;  Surgeon: Shann Medal, MD;  Location: [facility];  Service: General;  Laterality: Right;    has Breast cancer of upper-outer quadrant of left female breast; Chest pain of uncertain etiology; Anemia; Symptomatic anemia; Rash; and Dry skin dermatitis on her problem list.    is allergic to hydrocodone.    Medication List       This list is accurate as of: 06/15/14  5:24 PM.  Always use your most recent med list.               anastrozole 1 MG tablet  Commonly known as:  ARIMIDEX  Take 1 tablet (1 mg total) by mouth daily.     cholecalciferol 1000 UNITS tablet  Commonly known as:  VITAMIN D  Take 1,000 Units by mouth daily.     gabapentin 300 MG  capsule  Commonly known as:  NEURONTIN  Take 1 capsule (300 mg total) by mouth at bedtime.     lidocaine-prilocaine cream  Commonly known as:  EMLA  Apply 1 application topically as needed.     lisinopril-hydrochlorothiazide 10-12.5 MG per tablet  Commonly known as:  PRINZIDE,ZESTORETIC  Take 1 tablet by mouth daily.     multivitamin tablet  Take 1 tablet by mouth daily.     omeprazole 40 MG capsule  Commonly known as:  PRILOSEC  Take 1 capsule (40 mg total) by mouth daily.     venlafaxine XR 37.5 MG 24 hr capsule  Commonly known as:  EFFEXOR-XR  TAKE ONE CAPSULE BY MOUTH EVERY DAY WITH BREAKFAST     VITAMIN B 12 PO  Take 1 tablet by mouth daily.     zolpidem 5 MG tablet  Commonly known as:  AMBIEN  Take 5 mg by mouth at bedtime as needed for sleep.         PHYSICAL EXAMINATION  Oncology Vitals 06/15/2014 06/08/2014 05/18/2014 04/27/2014 04/06/2014 03/16/2014 03/08/2014  Height - 168 cm - 168 cm 168 cm 168 cm -  Weight 87.045 kg 86.955 kg - 87.136 kg 88.996 kg 88.225 kg -  Weight (lbs) 191 lbs 14 oz 191 lbs 11 oz - 192 lbs 2 oz 196 lbs 3 oz 194 lbs 8  oz -  BMI (kg/m2) - 30.94 kg/m2 - 31.01 kg/m2 31.67 kg/m2 31.39 kg/m2 -  Temp 98.3 97.9 97.1 98.1 97.7 97.9 -  Pulse 91 70 77 92 86 85 79  Resp _0 -  SpO2 100 100 99 97 - 98 -  BSA (m2) - 2.01 m2 - 2.01 m2 2.04 m2 2.03 m2 -   BP Readings from Last 3 Encounters:  06/15/14 114/84  06/08/14 119/78  05/18/14 97/65    Physical Exam  Constitutional: She is oriented to person, place, and time and well-developed, well-nourished, and in no distress.  HENT:  Head: Normocephalic and atraumatic.  Mouth/Throat: Oropharynx is clear and moist.  Eyes: Conjunctivae and EOM are normal. Pupils are equal, round, and reactive to light. Right eye exhibits no discharge. Left eye exhibits no discharge. No scleral icterus.  Neck: Normal range of motion.  Pulmonary/Chest: No respiratory distress.  Musculoskeletal: Normal range of  motion. She exhibits no edema or tenderness.  Neurological: She is alert and oriented to person, place, and time. Gait normal.  Skin: Skin is warm and dry. No rash noted. No erythema. No pallor.  Patient has some obvious radiation changes to her skin with some discoloration to the left breast and chest wall area.  There is no obvious rash to her chest wall or back.  She has some extremely dry skin to her bilateral upper/mid back region.  Directly underneath where her bra lies.  No evidence of infection.  Psychiatric: Affect normal.  Nursing note and vitals reviewed.   LABORATORY DATA:. No visits with results within 3 Day(s) from this visit. Latest known visit with results is:  Appointment on 06/08/2014  Component Date Value Ref Range Status  . WBC 06/08/2014 3.7* 3.9 - 10.3 10e3/uL Final  . NEUT# 06/08/2014 2.0  1.5 - 6.5 10e3/uL Final  . HGB 06/08/2014 10.4* 11.6 - 15.9 g/dL Final  . HCT 06/08/2014 32.0* 34.8 - 46.6 % Final  . Platelets 06/08/2014 331  145 - 400 10e3/uL Final  . MCV 06/08/2014 80.4  79.5 - 101.0 fL Final  . MCH 06/08/2014 26.1  25.1 - 34.0 pg Final  . MCHC 06/08/2014 32.5  31.5 - 36.0 g/dL Final  . RBC 06/08/2014 3.98  3.70 - 5.45 10e6/uL Final  . RDW 06/08/2014 15.7* 11.2 - 14.5 % Final  . lymph# 06/08/2014 1.2  0.9 - 3.3 10e3/uL Final  . MONO# 06/08/2014 0.4  0.1 - 0.9 10e3/uL Final  . Eosinophils Absolute 06/08/2014 0.2  0.0 - 0.5 10e3/uL Final  . Basophils Absolute 06/08/2014 0.0  0.0 - 0.1 10e3/uL Final  . NEUT% 06/08/2014 54.3  38.4 - 76.8 % Final  . LYMPH% 06/08/2014 31.5  14.0 - 49.7 % Final  . MONO% 06/08/2014 9.6  0.0 - 14.0 % Final  . EOS% 06/08/2014 4.1  0.0 - 7.0 % Final  . BASO% 06/08/2014 0.5  0.0 - 2.0 % Final  . Sodium 06/08/2014 143  136 - 145 mEq/L Final  . Potassium 06/08/2014 3.8  3.5 - 5.1 mEq/L Final  . Chloride 06/08/2014 106  98 - 109 mEq/L Final  . CO2 06/08/2014 24  22 - 29 mEq/L Final  . Glucose 06/08/2014 104  70 - 140 mg/dl Final    . BUN 06/08/2014 20.5  7.0 - 26.0 mg/dL Final  . Creatinine 06/08/2014 0.8  0.6 - 1.1 mg/dL Final  . Total Bilirubin 06/08/2014 0.30  0.20 - 1.20 mg/dL Final  . Alkaline Phosphatase  06/08/2014 72  40 - 150 U/L Final  . AST 06/08/2014 17  5 - 34 U/L Final  . ALT 06/08/2014 14  0 - 55 U/L Final  . Total Protein 06/08/2014 7.2  6.4 - 8.3 g/dL Final  . Albumin 06/08/2014 3.7  3.5 - 5.0 g/dL Final  . Calcium 06/08/2014 9.2  8.4 - 10.4 mg/dL Final  . Anion Gap 06/08/2014 13* 3 - 11 mEq/L Final  . EGFR 06/08/2014 >90  >90 ml/min/1.73 m2 Final   eGFR is calculated using the CKD-EPI Creatinine Equation (2009)     RADIOGRAPHIC STUDIES: No results found.  ASSESSMENT/PLAN:    Breast cancer of upper-outer quadrant of left female breast Patient is status post left breast lumpectomy, chemotherapy, completed in November 2015, and radiation therapy completed in February 2016.  She continues to undergo Herceptin infusions on an every three-week basis.  She also continues to take anastrozole on a daily basis.  Patient is scheduled to return for her next cycle of Herceptin on 06/29/2014.  She is scheduled to return for labs, follow up visit, and Herceptin again on 07/26/2014.   Dry skin dermatitis Patient is complaining of extremely dry, pruritic skin to her bilateral mid to upper back directly underneath her bra for approximately one month.  Patient states that she has been using Benadryl cream topically to the sites; with minimal effectiveness.  On exam.-Patient has obvious radiation changes to her skin with some discoloration to the left breast and chest wall.  However, there is no obvious rash to her chest wall or back.  She does have some extremely dry skin to her bilateral upper/mid back region directly underneath where her bra lies.  There is no evidence of infection.  Advised patient to try the Aquaphor samples I gave her; as well as some hydrocortisone cream to the sites.  Also advised that  patient try wearing a cotton bra; or no bra at all.  Advised patient to call/return if she develops any worsening symptoms whatsoever.   Patient stated understanding of all instructions; and was in agreement with this plan of care. The patient knows to call the clinic with any problems, questions or concerns.   Review/collaboration with Dr. Lindi Adie regarding all aspects of patient's visit today.   Total time spent with patient was 25 minutes;  with greater than 75 percent of that time spent in face to face counseling regarding patient's symptoms,  and coordination of care and follow up.  Disclaimer: This note was dictated with voice recognition software. Similar sounding words can inadvertently be transcribed and may not be corrected upon review.   Drue Second, NP 06/15/2014

## 2014-06-15 NOTE — Assessment & Plan Note (Signed)
Patient is status post left breast lumpectomy, chemotherapy, completed in November 2015, and radiation therapy completed in February 2016.  She continues to undergo Herceptin infusions on an every three-week basis.  She also continues to take anastrozole on a daily basis.  Patient is scheduled to return for her next cycle of Herceptin on 06/29/2014.  She is scheduled to return for labs, follow up visit, and Herceptin again on 07/26/2014.

## 2014-06-15 NOTE — Assessment & Plan Note (Signed)
Patient is complaining of extremely dry, pruritic skin to her bilateral mid to upper back directly underneath her bra for approximately one month.  Patient states that she has been using Benadryl cream topically to the sites; with minimal effectiveness.  On exam.-Patient has obvious radiation changes to her skin with some discoloration to the left breast and chest wall.  However, there is no obvious rash to her chest wall or back.  She does have some extremely dry skin to her bilateral upper/mid back region directly underneath where her bra lies.  There is no evidence of infection.  Advised patient to try the Aquaphor samples I gave her; as well as some hydrocortisone cream to the sites.  Also advised that patient try wearing a cotton bra; or no bra at all.  Advised patient to call/return if she develops any worsening symptoms whatsoever.

## 2014-06-21 ENCOUNTER — Other Ambulatory Visit: Payer: Self-pay | Admitting: Obstetrics and Gynecology

## 2014-06-21 DIAGNOSIS — Z1231 Encounter for screening mammogram for malignant neoplasm of breast: Secondary | ICD-10-CM

## 2014-06-21 DIAGNOSIS — Z853 Personal history of malignant neoplasm of breast: Secondary | ICD-10-CM

## 2014-06-23 ENCOUNTER — Other Ambulatory Visit: Payer: Self-pay | Admitting: Hematology and Oncology

## 2014-06-26 ENCOUNTER — Ambulatory Visit (HOSPITAL_COMMUNITY)
Admission: RE | Admit: 2014-06-26 | Discharge: 2014-06-26 | Disposition: A | Discharge status: Home / Self Care | Payer: BLUE CROSS/BLUE SHIELD | Source: Ambulatory Visit | Attending: Hematology and Oncology | Admitting: Hematology and Oncology

## 2014-06-26 ENCOUNTER — Other Ambulatory Visit (HOSPITAL_COMMUNITY): Payer: Self-pay | Admitting: Hematology and Oncology

## 2014-06-26 ENCOUNTER — Other Ambulatory Visit: Payer: Self-pay | Admitting: Hematology and Oncology

## 2014-06-26 DIAGNOSIS — Z09 Encounter for follow-up examination after completed treatment for conditions other than malignant neoplasm: Secondary | ICD-10-CM

## 2014-06-26 DIAGNOSIS — D649 Anemia, unspecified: Secondary | ICD-10-CM | POA: Insufficient documentation

## 2014-06-26 DIAGNOSIS — Z87891 Personal history of nicotine dependence: Secondary | ICD-10-CM | POA: Diagnosis not present

## 2014-06-26 DIAGNOSIS — C50512 Malignant neoplasm of lower-outer quadrant of left female breast: Secondary | ICD-10-CM | POA: Diagnosis not present

## 2014-06-26 DIAGNOSIS — C50412 Malignant neoplasm of upper-outer quadrant of left female breast: Secondary | ICD-10-CM | POA: Diagnosis present

## 2014-06-26 NOTE — Progress Notes (Signed)
*  PRELIMINARY RESULTS* Echocardiogram 2D Echocardiogram has been performed.  Leavy Cella 06/26/2014, 10:39 AM

## 2014-06-29 ENCOUNTER — Ambulatory Visit (HOSPITAL_BASED_OUTPATIENT_CLINIC_OR_DEPARTMENT_OTHER): Payer: BLUE CROSS/BLUE SHIELD

## 2014-06-29 VITALS — BP 100/72 | HR 70 | Temp 97.8°F | Resp 18

## 2014-06-29 DIAGNOSIS — Z5112 Encounter for antineoplastic immunotherapy: Secondary | ICD-10-CM | POA: Diagnosis not present

## 2014-06-29 DIAGNOSIS — C50412 Malignant neoplasm of upper-outer quadrant of left female breast: Secondary | ICD-10-CM | POA: Diagnosis not present

## 2014-06-29 MED ORDER — TRASTUZUMAB CHEMO INJECTION 440 MG
6.0000 mg/kg | Freq: Once | INTRAVENOUS | Status: AC
  Administered 2014-06-29: 525 mg via INTRAVENOUS
  Filled 2014-06-29: qty 25

## 2014-06-29 MED ORDER — SODIUM CHLORIDE 0.9 % IJ SOLN
10.0000 mL | INTRAMUSCULAR | Status: DC | PRN
  Administered 2014-06-29: 10 mL
  Filled 2014-06-29: qty 10

## 2014-06-29 MED ORDER — ACETAMINOPHEN 325 MG PO TABS
ORAL_TABLET | ORAL | Status: AC
  Filled 2014-06-29: qty 2

## 2014-06-29 MED ORDER — ACETAMINOPHEN 325 MG PO TABS
650.0000 mg | ORAL_TABLET | Freq: Once | ORAL | Status: AC
  Administered 2014-06-29: 650 mg via ORAL

## 2014-06-29 MED ORDER — HEPARIN SOD (PORK) LOCK FLUSH 100 UNIT/ML IV SOLN
500.0000 [IU] | Freq: Once | INTRAVENOUS | Status: AC | PRN
  Administered 2014-06-29: 500 [IU]
  Filled 2014-06-29: qty 5

## 2014-06-29 MED ORDER — DIPHENHYDRAMINE HCL 25 MG PO CAPS
ORAL_CAPSULE | ORAL | Status: AC
  Filled 2014-06-29: qty 2

## 2014-06-29 MED ORDER — DIPHENHYDRAMINE HCL 25 MG PO CAPS
50.0000 mg | ORAL_CAPSULE | Freq: Once | ORAL | Status: AC
  Administered 2014-06-29: 50 mg via ORAL

## 2014-06-29 MED ORDER — SODIUM CHLORIDE 0.9 % IV SOLN
Freq: Once | INTRAVENOUS | Status: AC
  Administered 2014-06-29: 11:00:00 via INTRAVENOUS

## 2014-06-29 NOTE — Patient Instructions (Signed)
[facility] Discharge Instructions for Patients Receiving Chemotherapy  Today you received the following chemotherapy agents: herceptin  To help prevent nausea and vomiting after your treatment, we encourage you to take your nausea medication.  Take it as often as prescribed.     If you develop nausea and vomiting that is not controlled by your nausea medication, call the clinic. If it is after clinic hours your family physician or the after hours number for the clinic or go to the Emergency Department.   BELOW ARE SYMPTOMS THAT SHOULD BE REPORTED IMMEDIATELY:  *FEVER GREATER THAN 100.5 F  *CHILLS WITH OR WITHOUT FEVER  NAUSEA AND VOMITING THAT IS NOT CONTROLLED WITH YOUR NAUSEA MEDICATION  *UNUSUAL SHORTNESS OF BREATH  *UNUSUAL BRUISING OR BLEEDING  TENDERNESS IN MOUTH AND THROAT WITH OR WITHOUT PRESENCE OF ULCERS  *URINARY PROBLEMS  *BOWEL PROBLEMS  UNUSUAL RASH Items with * indicate a potential emergency and should be followed up as soon as possible.  Feel free to call the clinic you have any questions or concerns. The clinic phone number is [phone].   I have been informed and understand all the instructions given to me. I know to contact the clinic, my physician, or go to the Emergency Department if any problems should occur. I do not have any questions at this time, but understand that I may call the clinic during office hours   should I have any questions or need assistance in obtaining follow up care.    __________________________________________  _____________  __________ Signature of Patient or Authorized Representative            Date                   Time    __________________________________________ Nurse's Signature    

## 2014-07-04 ENCOUNTER — Telehealth: Payer: Self-pay

## 2014-07-04 NOTE — Telephone Encounter (Signed)
Lab results dtd 06/28/14 rcvd from [facility].  Reviewed by Dr. Lindi Adie.  Sent to scan.

## 2014-07-25 NOTE — Assessment & Plan Note (Signed)
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 Lucia Gaskins, received Taxol Herceptin on 10/04/2013 weekly x12, now on every 3 week Herceptin maintenance, completed adjuvant radiation, started tamoxifen 20 mg daily 03/16/2014  Current treatment: Herceptin maintenance until August 2016, Anastrozole 1 mg daily started 03/30/2014 plan is for 5 years.  Anastrozole toxicities:  1. Hot flashes 2. fatigue   Anemia due to chemotherapy: Hemoglobin is 10.4 today. Fatigue: Patient is now able to work full-time and she is working overtime.  Plan: continue Herceptin every 3 weeks area I will see her back in 6 weeks for follow-up. Continue anastrozole

## 2014-07-26 ENCOUNTER — Telehealth: Payer: Self-pay | Admitting: Hematology and Oncology

## 2014-07-26 ENCOUNTER — Other Ambulatory Visit: Payer: Self-pay | Admitting: *Deleted

## 2014-07-26 ENCOUNTER — Ambulatory Visit (HOSPITAL_BASED_OUTPATIENT_CLINIC_OR_DEPARTMENT_OTHER): Payer: BLUE CROSS/BLUE SHIELD

## 2014-07-26 ENCOUNTER — Encounter: Payer: Self-pay | Admitting: Hematology and Oncology

## 2014-07-26 ENCOUNTER — Ambulatory Visit (HOSPITAL_BASED_OUTPATIENT_CLINIC_OR_DEPARTMENT_OTHER): Payer: BLUE CROSS/BLUE SHIELD | Admitting: Hematology and Oncology

## 2014-07-26 ENCOUNTER — Other Ambulatory Visit (HOSPITAL_BASED_OUTPATIENT_CLINIC_OR_DEPARTMENT_OTHER): Payer: BLUE CROSS/BLUE SHIELD

## 2014-07-26 VITALS — BP 108/77 | HR 75 | Temp 97.6°F | Resp 18 | Ht 66.0 in | Wt 189.9 lb

## 2014-07-26 DIAGNOSIS — N951 Menopausal and female climacteric states: Secondary | ICD-10-CM

## 2014-07-26 DIAGNOSIS — R5383 Other fatigue: Secondary | ICD-10-CM | POA: Diagnosis not present

## 2014-07-26 DIAGNOSIS — C50412 Malignant neoplasm of upper-outer quadrant of left female breast: Secondary | ICD-10-CM | POA: Diagnosis not present

## 2014-07-26 DIAGNOSIS — R110 Nausea: Secondary | ICD-10-CM

## 2014-07-26 DIAGNOSIS — Z5112 Encounter for antineoplastic immunotherapy: Secondary | ICD-10-CM

## 2014-07-26 DIAGNOSIS — Z170 Estrogen receptor positive status [ER+]: Secondary | ICD-10-CM

## 2014-07-26 LAB — CBC WITH DIFFERENTIAL/PLATELET
BASO%: 0.3 % (ref 0.0–2.0)
Basophils Absolute: 0 10*3/uL (ref 0.0–0.1)
EOS ABS: 0.1 10*3/uL (ref 0.0–0.5)
EOS%: 3.1 % (ref 0.0–7.0)
HCT: 31.2 % — ABNORMAL LOW (ref 34.8–46.6)
HGB: 10.3 g/dL — ABNORMAL LOW (ref 11.6–15.9)
LYMPH%: 31.1 % (ref 14.0–49.7)
MCH: 27.8 pg (ref 25.1–34.0)
MCHC: 33 g/dL (ref 31.5–36.0)
MCV: 84.1 fL (ref 79.5–101.0)
MONO#: 0.2 10*3/uL (ref 0.1–0.9)
MONO%: 5.6 % (ref 0.0–14.0)
NEUT#: 2.1 10*3/uL (ref 1.5–6.5)
NEUT%: 59.9 % (ref 38.4–76.8)
PLATELETS: 359 10*3/uL (ref 145–400)
RBC: 3.71 10*6/uL (ref 3.70–5.45)
RDW: 15.2 % — ABNORMAL HIGH (ref 11.2–14.5)
WBC: 3.5 10*3/uL — AB (ref 3.9–10.3)
lymph#: 1.1 10*3/uL (ref 0.9–3.3)

## 2014-07-26 LAB — COMPREHENSIVE METABOLIC PANEL (CC13)
ALT: 21 U/L (ref 0–55)
ANION GAP: 10 meq/L (ref 3–11)
AST: 20 U/L (ref 5–34)
Albumin: 3.7 g/dL (ref 3.5–5.0)
Alkaline Phosphatase: 86 U/L (ref 40–150)
BILIRUBIN TOTAL: 0.22 mg/dL (ref 0.20–1.20)
BUN: 21 mg/dL (ref 7.0–26.0)
CO2: 26 mEq/L (ref 22–29)
CREATININE: 1 mg/dL (ref 0.6–1.1)
Calcium: 9.2 mg/dL (ref 8.4–10.4)
Chloride: 105 mEq/L (ref 98–109)
EGFR: 72 mL/min/{1.73_m2} — AB (ref >90)
Glucose: 162 mg/dl — ABNORMAL HIGH (ref 70–140)
Potassium: 3.3 mEq/L — ABNORMAL LOW (ref 3.5–5.1)
SODIUM: 141 meq/L (ref 136–145)
TOTAL PROTEIN: 7.3 g/dL (ref 6.4–8.3)

## 2014-07-26 MED ORDER — ACETAMINOPHEN 325 MG PO TABS
650.0000 mg | ORAL_TABLET | Freq: Once | ORAL | Status: AC
  Administered 2014-07-26: 650 mg via ORAL

## 2014-07-26 MED ORDER — ONDANSETRON HCL 8 MG PO TABS: 8.0000 mg | ORAL_TABLET | Freq: Three times a day (TID) | ORAL | Status: DC | PRN

## 2014-07-26 MED ORDER — HEPARIN SOD (PORK) LOCK FLUSH 100 UNIT/ML IV SOLN
500.0000 [IU] | Freq: Once | INTRAVENOUS | Status: AC | PRN
  Administered 2014-07-26: 500 [IU]
  Filled 2014-07-26: qty 5

## 2014-07-26 MED ORDER — DIPHENHYDRAMINE HCL 25 MG PO CAPS
ORAL_CAPSULE | ORAL | Status: AC
  Filled 2014-07-26: qty 2

## 2014-07-26 MED ORDER — ACETAMINOPHEN 325 MG PO TABS
ORAL_TABLET | ORAL | Status: AC
  Filled 2014-07-26: qty 2

## 2014-07-26 MED ORDER — SODIUM CHLORIDE 0.9 % IV SOLN
Freq: Once | INTRAVENOUS | Status: AC
  Administered 2014-07-26: 11:00:00 via INTRAVENOUS

## 2014-07-26 MED ORDER — DIPHENHYDRAMINE HCL 25 MG PO CAPS
50.0000 mg | ORAL_CAPSULE | Freq: Once | ORAL | Status: AC
  Administered 2014-07-26: 50 mg via ORAL

## 2014-07-26 MED ORDER — SODIUM CHLORIDE 0.9 % IJ SOLN
10.0000 mL | INTRAMUSCULAR | Status: DC | PRN
  Administered 2014-07-26: 10 mL
  Filled 2014-07-26: qty 10

## 2014-07-26 MED ORDER — TRASTUZUMAB CHEMO INJECTION 440 MG
6.0000 mg/kg | Freq: Once | INTRAVENOUS | Status: AC
  Administered 2014-07-26: 525 mg via INTRAVENOUS
  Filled 2014-07-26: qty 25

## 2014-07-26 NOTE — Patient Instructions (Signed)

## 2014-07-26 NOTE — Telephone Encounter (Signed)
Gave avs & calendar for August °

## 2014-07-26 NOTE — Progress Notes (Signed)
Patient Care Team: Gavin Pound, MD as PCP - General (Family Medicine) [name], MD as Consulting Physician (General Surgery) Thea Silversmith, MD as Consulting Physician (Radiation Oncology) Gavin Pound, MD as Consulting Physician (Family Medicine) Nicholas Lose, MD as Consulting Physician (Hematology and Oncology)  DIAGNOSIS: Breast cancer of upper-outer quadrant of left female breast   Staging form: Breast, AJCC 7th Edition     Clinical: Stage IIA (T2, N0, cM0) - Signed by Rulon Eisenmenger, MD on 09/20/2013       Staging comments: Staged at breast conference 08/17/13.      Pathologic: T1c, N0, cM0 - Unsigned   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  The cancer was grade 2 and ER positive at 100%, PR+ at 91% and HER2- with a Ki67 of 47%   08/12/2013 Breast MRI 2.1 x 1.2 x 1.2 cm biopsy-proven invasive ductal carcinoma and ductal carcinoma in situ in the 9 o'clock position of the left breast   08/30/2013 Surgery Left Lumpectomy IDC with infiltrating lobular features. Pos for LVSI; 1 mm from nearest margin, DCIS 1 SLN Neg; ONCOTYPE Dx Rec score 17, 10 yr risk of recurrance is 11% with Tamoxifen alone   10/04/2013 - 12/27/2013 Chemotherapy Weekly Taxol/Herceptin x 12; Herceptin maintenance q. 3 weeks until end of August 2016   01/17/2014 - 03/09/2014 Radiation Therapy Adjuvant radiation therapy   03/16/2014 -  Anti-estrogen oral therapy anastrozole 1 mg daily to start 03/30/2014 plan is for 5 years    CHIEF COMPLIANT: Follow-up on anastrozole and Herceptin  INTERVAL HISTORY: Jean [name] is a 54 year old with above-mentioned history of left breast cancer currently receiving  adjuvant Herceptin along with adjuvant anastrozole. She is tolerating it very well without any major problems or concerns. Last echocardiogram 06/26/2014 showed an ejection fraction 55-60%. Complains of occasional nausea, decreased appetite, fatigue, neuropathy.  REVIEW OF SYSTEMS:   Constitutional: Denies fevers, chills or abnormal weight loss Eyes: Denies blurriness of vision Ears, nose, mouth, throat, and face: Denies mucositis or sore throat Respiratory: Denies cough, dyspnea or wheezes Cardiovascular: Denies palpitation, chest discomfort or lower extremity swelling Gastrointestinal:  Nausea Skin: Denies abnormal skin rashes Lymphatics: Denies new lymphadenopathy or easy bruising Neurological: Tingling numbness in hands and feet Behavioral/Psych: Mood is stable, no new changes  Breast:  denies any pain or lumps or nodules in either breasts All other systems were reviewed with the patient and are negative.  I have reviewed the past medical history, past surgical history, social history and family history with the patient and they are unchanged from previous note.  ALLERGIES:  is allergic to hydrocodone.  MEDICATIONS:  Current Outpatient Prescriptions  Medication Sig Dispense Refill  . anastrozole (ARIMIDEX) 1 MG tablet Take 1 tablet (1 mg total) by mouth daily. 90 tablet 3  . cholecalciferol (VITAMIN D) 1000 UNITS tablet Take 1,000 Units by mouth daily.    . Cyanocobalamin (VITAMIN B 12 PO) Take 1 tablet by mouth daily.    Marland Kitchen gabapentin (NEURONTIN) 300 MG capsule Take 1 capsule (300 mg total) by mouth at bedtime. 90 capsule 3  . lidocaine-prilocaine (EMLA) cream Apply 1 application topically as needed. 30 g 3  . lisinopril-hydrochlorothiazide (PRINZIDE,ZESTORETIC) 10-12.5 MG  per tablet Take 1 tablet by mouth daily.  0  . Multiple Vitamin (MULTIVITAMIN) tablet Take 1 tablet by mouth daily.    Marland Kitchen omeprazole (PRILOSEC) 40 MG capsule Take 1 capsule (40 mg total) by mouth daily. 30 capsule  3  . venlafaxine XR (EFFEXOR-XR) 37.5 MG 24 hr capsule TAKE ONE CAPSULE BY MOUTH EVERY DAY WITH BREAKFAST 30 capsule 3  . zolpidem (AMBIEN) 5 MG tablet Take 5 mg by mouth at bedtime as needed for sleep.     No current facility-administered medications for this visit.    PHYSICAL EXAMINATION: ECOG PERFORMANCE STATUS: 1 - Symptomatic but completely ambulatory  Filed Vitals:   07/26/14 1001  BP: 108/77  Pulse: 75  Temp: 97.6 F (36.4 C)  Resp: 18   Filed Weights   07/26/14 1001  Weight: 189 lb 14.4 oz (86.138 kg)    GENERAL:alert, no distress and comfortable SKIN: skin color, texture, turgor are normal, no rashes or significant lesions EYES: normal, Conjunctiva are pink and non-injected, sclera clear OROPHARYNX:no exudate, no erythema and lips, buccal mucosa, and tongue normal  NECK: supple, thyroid normal size, non-tender, without nodularity LYMPH:  no palpable lymphadenopathy in the cervical, axillary or inguinal LUNGS: clear to auscultation and percussion with normal breathing effort HEART: regular rate & rhythm and no murmurs and no lower extremity edema ABDOMEN:abdomen soft, non-tender and normal bowel sounds Musculoskeletal:no cyanosis of digits and no clubbing  NEURO: alert & oriented x 3 with fluent speech, grade 1 neuropathy Rash underneath axilla  LABORATORY DATA:  I have reviewed the data as listed   Chemistry      Component Value Date/Time   NA 141 07/26/2014 0942   NA 142 01/10/2014 1639   K 3.3* 07/26/2014 0942   K 3.8 01/10/2014 1639   CL 101 01/10/2014 1639   CO2 26 07/26/2014 0942   CO2 28 01/10/2014 1639   BUN 21.0 07/26/2014 0942   BUN 12 01/10/2014 1639   CREATININE 1.0 07/26/2014 0942   CREATININE 0.80 01/10/2014 1639      Component Value Date/Time   CALCIUM 9.2 07/26/2014 0942   CALCIUM 10.0 01/10/2014 1639   ALKPHOS 86 07/26/2014 0942   ALKPHOS 73 01/10/2014 1639   AST 20 07/26/2014 0942   AST 19 01/10/2014 1639   ALT 21 07/26/2014 0942    ALT 18 01/10/2014 1639   BILITOT 0.22 07/26/2014 0942   BILITOT <0.2* 01/10/2014 1639       Lab Results  Component Value Date   WBC 3.5* 07/26/2014   HGB 10.3* 07/26/2014   HCT 31.2* 07/26/2014   MCV 84.1 07/26/2014   PLT 359 07/26/2014   NEUTROABS 2.1 07/26/2014     ASSESSMENT & PLAN:  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 Lucia Gaskins, received Taxol Herceptin on 10/04/2013 weekly x12, now on every 3 week Herceptin maintenance, completed adjuvant radiation, started tamoxifen 20 mg daily 03/16/2014  Current treatment: Herceptin maintenance until August 2016, Anastrozole 1 mg daily started 03/30/2014 plan is for 5 years.  Anastrozole toxicities:  1. Hot flashes: Currently on Effexor, Neurontin 2. fatigue   Anemia due to chemotherapy: Hemoglobin is 10.3 today. Fatigue: Patient is now able to work full-time and she is working overtime. Nausea: Renewed Zofran Neuropathy grade 1: Takes Neurontin  Plan:  continue Herceptin every 3 weeks area I will see her back in 6 weeks for follow-up. Continue anastrozole  Return to clinic in 9 weeks for follow-up with labs, which will be her last Herceptin treatment  The patient has a good understanding of the overall plan. she agrees with it. she will call with any problems that may develop before the next visit here.   Rulon Eisenmenger, MD

## 2014-08-03 ENCOUNTER — Ambulatory Visit
Admission: RE | Admit: 2014-08-03 | Discharge: 2014-08-03 | Disposition: A | Discharge status: Home / Self Care | Payer: BLUE CROSS/BLUE SHIELD | Source: Ambulatory Visit | Attending: Obstetrics and Gynecology | Admitting: Obstetrics and Gynecology

## 2014-08-03 DIAGNOSIS — Z853 Personal history of malignant neoplasm of breast: Secondary | ICD-10-CM

## 2014-08-14 ENCOUNTER — Telehealth: Payer: Self-pay | Admitting: *Deleted

## 2014-08-14 NOTE — Telephone Encounter (Signed)
Per below, pt only needs to be seen every other treatment, or every 6 weeks.  Jean [name]

## 2014-08-14 NOTE — Telephone Encounter (Signed)
PT. SAW [facility] ON 07/26/14 AND RECEIVED HERCEPTIN. SHE IS SCHEDULED TO SEE DR.GUDENA AND RECEIVE HERCEPTIN ON 09/27/14. SHOULD PT. HAVE AN APPOINTMENT FOR HERCEPTIN IN July? PT.'S HAS A GENERIC VOICE MAIL. LEFT A MESSAGE FOR PT. TO RETURN THIS CALL SO SHE WILL KNOW HER 12:47PM VOICE MAIL WAS RECEIVED AND A MESSAGE WAS SENT TO THE PHYSICIAN.

## 2014-08-15 ENCOUNTER — Other Ambulatory Visit: Payer: Self-pay

## 2014-08-15 ENCOUNTER — Telehealth: Payer: Self-pay | Admitting: Hematology and Oncology

## 2014-08-15 NOTE — Telephone Encounter (Signed)
s.w. pt and advised on JULy 15...Marland Kitchenpt ok and aware

## 2014-08-18 ENCOUNTER — Ambulatory Visit (HOSPITAL_BASED_OUTPATIENT_CLINIC_OR_DEPARTMENT_OTHER): Payer: BLUE CROSS/BLUE SHIELD

## 2014-08-18 ENCOUNTER — Other Ambulatory Visit (HOSPITAL_BASED_OUTPATIENT_CLINIC_OR_DEPARTMENT_OTHER): Payer: BLUE CROSS/BLUE SHIELD

## 2014-08-18 VITALS — BP 125/83 | HR 64 | Temp 98.0°F | Resp 18

## 2014-08-18 DIAGNOSIS — Z452 Encounter for adjustment and management of vascular access device: Secondary | ICD-10-CM | POA: Diagnosis not present

## 2014-08-18 DIAGNOSIS — C50412 Malignant neoplasm of upper-outer quadrant of left female breast: Secondary | ICD-10-CM

## 2014-08-18 DIAGNOSIS — Z5112 Encounter for antineoplastic immunotherapy: Secondary | ICD-10-CM | POA: Diagnosis not present

## 2014-08-18 LAB — CBC WITH DIFFERENTIAL/PLATELET
BASO%: 0.3 % (ref 0.0–2.0)
Basophils Absolute: 0 10*3/uL (ref 0.0–0.1)
EOS%: 6.5 % (ref 0.0–7.0)
Eosinophils Absolute: 0.2 10*3/uL (ref 0.0–0.5)
HCT: 31.4 % — ABNORMAL LOW (ref 34.8–46.6)
HGB: 10.2 g/dL — ABNORMAL LOW (ref 11.6–15.9)
LYMPH#: 1.2 10*3/uL (ref 0.9–3.3)
LYMPH%: 36.7 % (ref 14.0–49.7)
MCH: 27.1 pg (ref 25.1–34.0)
MCHC: 32.5 g/dL (ref 31.5–36.0)
MCV: 83.5 fL (ref 79.5–101.0)
MONO#: 0.4 10*3/uL (ref 0.1–0.9)
MONO%: 12.1 % (ref 0.0–14.0)
NEUT%: 44.4 % (ref 38.4–76.8)
NEUTROS ABS: 1.5 10*3/uL (ref 1.5–6.5)
PLATELETS: 335 10*3/uL (ref 145–400)
RBC: 3.76 10*6/uL (ref 3.70–5.45)
RDW: 14.4 % (ref 11.2–14.5)
WBC: 3.4 10*3/uL — ABNORMAL LOW (ref 3.9–10.3)
nRBC: 0 % (ref 0–0)

## 2014-08-18 LAB — COMPREHENSIVE METABOLIC PANEL (CC13)
ALK PHOS: 81 U/L (ref 40–150)
ALT: 15 U/L (ref 0–55)
ANION GAP: 8 meq/L (ref 3–11)
AST: 21 U/L (ref 5–34)
Albumin: 3.7 g/dL (ref 3.5–5.0)
BUN: 15.6 mg/dL (ref 7.0–26.0)
CALCIUM: 9.4 mg/dL (ref 8.4–10.4)
CHLORIDE: 106 meq/L (ref 98–109)
CO2: 27 meq/L (ref 22–29)
Creatinine: 0.9 mg/dL (ref 0.6–1.1)
EGFR: 85 mL/min/{1.73_m2} — AB (ref >90)
Glucose: 92 mg/dl (ref 70–140)
Potassium: 3.7 mEq/L (ref 3.5–5.1)
Sodium: 140 mEq/L (ref 136–145)
Total Bilirubin: 0.21 mg/dL (ref 0.20–1.20)
Total Protein: 6.9 g/dL (ref 6.4–8.3)

## 2014-08-18 MED ORDER — ACETAMINOPHEN 325 MG PO TABS
650.0000 mg | ORAL_TABLET | Freq: Once | ORAL | Status: AC
  Administered 2014-08-18: 650 mg via ORAL

## 2014-08-18 MED ORDER — ACETAMINOPHEN 325 MG PO TABS
ORAL_TABLET | ORAL | Status: AC
  Filled 2014-08-18: qty 2

## 2014-08-18 MED ORDER — ALTEPLASE 2 MG IJ SOLR
2.0000 mg | Freq: Once | INTRAMUSCULAR | Status: AC | PRN
  Administered 2014-08-18: 2 mg
  Filled 2014-08-18: qty 2

## 2014-08-18 MED ORDER — HEPARIN SOD (PORK) LOCK FLUSH 100 UNIT/ML IV SOLN
500.0000 [IU] | Freq: Once | INTRAVENOUS | Status: AC | PRN
  Administered 2014-08-18: 500 [IU]
  Filled 2014-08-18: qty 5

## 2014-08-18 MED ORDER — TRASTUZUMAB CHEMO INJECTION 440 MG
6.0000 mg/kg | Freq: Once | INTRAVENOUS | Status: AC
  Administered 2014-08-18: 525 mg via INTRAVENOUS
  Filled 2014-08-18: qty 25

## 2014-08-18 MED ORDER — DIPHENHYDRAMINE HCL 25 MG PO CAPS
50.0000 mg | ORAL_CAPSULE | Freq: Once | ORAL | Status: AC
  Administered 2014-08-18: 50 mg via ORAL

## 2014-08-18 MED ORDER — SODIUM CHLORIDE 0.9 % IJ SOLN
10.0000 mL | INTRAMUSCULAR | Status: DC | PRN
  Administered 2014-08-18: 10 mL
  Filled 2014-08-18: qty 10

## 2014-08-18 MED ORDER — SODIUM CHLORIDE 0.9 % IV SOLN
Freq: Once | INTRAVENOUS | Status: AC
  Administered 2014-08-18: 10:00:00 via INTRAVENOUS

## 2014-08-18 MED ORDER — DIPHENHYDRAMINE HCL 25 MG PO CAPS
ORAL_CAPSULE | ORAL | Status: AC
  Filled 2014-08-18: qty 2

## 2014-08-18 NOTE — Patient Instructions (Signed)
[facility] Discharge Instructions for Patients Receiving Chemotherapy  Today you received the following chemotherapy agents Herceptin.  To help prevent nausea and vomiting after your treatment, we encourage you to take your nausea medication as prescribed by your physician. If you develop nausea and vomiting that is not controlled by your nausea medication, call the clinic.   BELOW ARE SYMPTOMS THAT SHOULD BE REPORTED IMMEDIATELY:  *FEVER GREATER THAN 100.5 F  *CHILLS WITH OR WITHOUT FEVER  NAUSEA AND VOMITING THAT IS NOT CONTROLLED WITH YOUR NAUSEA MEDICATION  *UNUSUAL SHORTNESS OF BREATH  *UNUSUAL BRUISING OR BLEEDING  TENDERNESS IN MOUTH AND THROAT WITH OR WITHOUT PRESENCE OF ULCERS  *URINARY PROBLEMS  *BOWEL PROBLEMS  UNUSUAL RASH Items with * indicate a potential emergency and should be followed up as soon as possible.  Feel free to call the clinic you have any questions or concerns. The clinic phone number is [phone].  Please show the CHEMO ALERT CARD at check-in to the Emergency Department and triage nurse.   

## 2014-09-27 ENCOUNTER — Encounter: Payer: Self-pay | Admitting: Hematology and Oncology

## 2014-09-27 ENCOUNTER — Telehealth: Payer: Self-pay | Admitting: Hematology and Oncology

## 2014-09-27 ENCOUNTER — Ambulatory Visit (HOSPITAL_BASED_OUTPATIENT_CLINIC_OR_DEPARTMENT_OTHER): Payer: BLUE CROSS/BLUE SHIELD

## 2014-09-27 ENCOUNTER — Ambulatory Visit (HOSPITAL_BASED_OUTPATIENT_CLINIC_OR_DEPARTMENT_OTHER): Payer: BLUE CROSS/BLUE SHIELD | Admitting: Hematology and Oncology

## 2014-09-27 ENCOUNTER — Other Ambulatory Visit (HOSPITAL_BASED_OUTPATIENT_CLINIC_OR_DEPARTMENT_OTHER): Payer: BLUE CROSS/BLUE SHIELD

## 2014-09-27 VITALS — BP 113/80 | HR 67 | Temp 98.2°F | Resp 18 | Ht 66.0 in | Wt 192.0 lb

## 2014-09-27 DIAGNOSIS — C50412 Malignant neoplasm of upper-outer quadrant of left female breast: Secondary | ICD-10-CM | POA: Diagnosis not present

## 2014-09-27 DIAGNOSIS — Z170 Estrogen receptor positive status [ER+]: Secondary | ICD-10-CM | POA: Diagnosis not present

## 2014-09-27 DIAGNOSIS — Z79811 Long term (current) use of aromatase inhibitors: Secondary | ICD-10-CM

## 2014-09-27 DIAGNOSIS — D6481 Anemia due to antineoplastic chemotherapy: Secondary | ICD-10-CM

## 2014-09-27 DIAGNOSIS — Z5112 Encounter for antineoplastic immunotherapy: Secondary | ICD-10-CM | POA: Diagnosis not present

## 2014-09-27 LAB — CBC WITH DIFFERENTIAL/PLATELET
BASO%: 0.3 % (ref 0.0–2.0)
Basophils Absolute: 0 10*3/uL (ref 0.0–0.1)
EOS%: 3.9 % (ref 0.0–7.0)
Eosinophils Absolute: 0.1 10*3/uL (ref 0.0–0.5)
HCT: 31.9 % — ABNORMAL LOW (ref 34.8–46.6)
HEMOGLOBIN: 10.4 g/dL — AB (ref 11.6–15.9)
LYMPH%: 42.6 % (ref 14.0–49.7)
MCH: 27 pg (ref 25.1–34.0)
MCHC: 32.6 g/dL (ref 31.5–36.0)
MCV: 82.9 fL (ref 79.5–101.0)
MONO#: 0.3 10*3/uL (ref 0.1–0.9)
MONO%: 8.4 % (ref 0.0–14.0)
NEUT#: 1.4 10*3/uL — ABNORMAL LOW (ref 1.5–6.5)
NEUT%: 44.8 % (ref 38.4–76.8)
Platelets: 303 10*3/uL (ref 145–400)
RBC: 3.85 10*6/uL (ref 3.70–5.45)
RDW: 14.1 % (ref 11.2–14.5)
WBC: 3.1 10*3/uL — AB (ref 3.9–10.3)
lymph#: 1.3 10*3/uL (ref 0.9–3.3)

## 2014-09-27 LAB — COMPREHENSIVE METABOLIC PANEL (CC13)
ALBUMIN: 3.7 g/dL (ref 3.5–5.0)
ALK PHOS: 80 U/L (ref 40–150)
ALT: 20 U/L (ref 0–55)
AST: 26 U/L (ref 5–34)
Anion Gap: 9 mEq/L (ref 3–11)
BILIRUBIN TOTAL: 0.21 mg/dL (ref 0.20–1.20)
BUN: 22 mg/dL (ref 7.0–26.0)
CO2: 26 mEq/L (ref 22–29)
Calcium: 9.5 mg/dL (ref 8.4–10.4)
Chloride: 106 mEq/L (ref 98–109)
Creatinine: 0.9 mg/dL (ref 0.6–1.1)
EGFR: 87 mL/min/{1.73_m2} — ABNORMAL LOW (ref >90)
GLUCOSE: 109 mg/dL (ref 70–140)
Potassium: 3.8 mEq/L (ref 3.5–5.1)
SODIUM: 141 meq/L (ref 136–145)
TOTAL PROTEIN: 7.1 g/dL (ref 6.4–8.3)

## 2014-09-27 MED ORDER — SODIUM CHLORIDE 0.9 % IJ SOLN
10.0000 mL | INTRAMUSCULAR | Status: DC | PRN
  Administered 2014-09-27: 10 mL
  Filled 2014-09-27: qty 10

## 2014-09-27 MED ORDER — SODIUM CHLORIDE 0.9 % IV SOLN
Freq: Once | INTRAVENOUS | Status: AC
  Administered 2014-09-27: 11:00:00 via INTRAVENOUS

## 2014-09-27 MED ORDER — DIPHENHYDRAMINE HCL 25 MG PO CAPS
50.0000 mg | ORAL_CAPSULE | Freq: Once | ORAL | Status: AC
  Administered 2014-09-27: 50 mg via ORAL

## 2014-09-27 MED ORDER — DIPHENHYDRAMINE HCL 25 MG PO CAPS
ORAL_CAPSULE | ORAL | Status: AC
  Filled 2014-09-27: qty 1

## 2014-09-27 MED ORDER — ACETAMINOPHEN 325 MG PO TABS
650.0000 mg | ORAL_TABLET | Freq: Once | ORAL | Status: AC
  Administered 2014-09-27: 650 mg via ORAL

## 2014-09-27 MED ORDER — TRASTUZUMAB CHEMO INJECTION 440 MG
6.0000 mg/kg | Freq: Once | INTRAVENOUS | Status: AC
  Administered 2014-09-27: 525 mg via INTRAVENOUS
  Filled 2014-09-27: qty 25

## 2014-09-27 MED ORDER — ACETAMINOPHEN 325 MG PO TABS
ORAL_TABLET | ORAL | Status: AC
  Filled 2014-09-27: qty 2

## 2014-09-27 MED ORDER — HEPARIN SOD (PORK) LOCK FLUSH 100 UNIT/ML IV SOLN
500.0000 [IU] | Freq: Once | INTRAVENOUS | Status: AC | PRN
  Administered 2014-09-27: 500 [IU]
  Filled 2014-09-27: qty 5

## 2014-09-27 NOTE — Assessment & Plan Note (Signed)
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 Lucia Gaskins, received Taxol Herceptin on 10/04/2013 weekly x12, now on every 3 week Herceptin maintenance, completed adjuvant radiation, started tamoxifen 20 mg daily 03/16/2014, Herceptin maintenance completed August 2016  Current treatment: Anastrozole 1 mg daily started 03/30/2014 plan is for 5 years.  Anastrozole toxicities:  1. Hot flashes: Currently on Effexor, Neurontin 2. fatigue   Anemia due to chemotherapy: Hemoglobin is 10.3 today. Fatigue: Patient is now able to work full-time and she is working overtime. Nausea: Renewed Zofran Neuropathy grade 1: Takes Neurontin  Return to clinic in 3 months for follow-up

## 2014-09-27 NOTE — Progress Notes (Signed)
Checked in pt to assist lobby. Pt has my card for any financial questions or concerns.

## 2014-09-27 NOTE — Progress Notes (Signed)
Okay to treat with ANC 1.4 per Dr. Gudena. 

## 2014-09-27 NOTE — Progress Notes (Signed)
Patient Care Team: Leighton Ruff, MD as PCP - General (Family Medicine) [name], MD as Consulting Physician (General Surgery) Thea Silversmith, MD as Consulting Physician (Radiation Oncology) Gavin Pound, MD as Consulting Physician (Family Medicine) Nicholas Lose, MD as Consulting Physician (Hematology and Oncology)  DIAGNOSIS: Breast cancer of upper-outer quadrant of left female breast   Staging form: Breast, AJCC 7th Edition     Clinical: Stage IIA (T2, N0, cM0) - Signed by Rulon Eisenmenger, MD on 09/20/2013       Staging comments: Staged at breast conference 08/17/13.      Pathologic: T1c, N0, cM0 - Unsigned   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  The cancer was grade 2 and ER positive at 100%, PR+ at 91% and HER2- with a Ki67 of 47%   08/12/2013 Breast MRI 2.1 x 1.2 x 1.2 cm biopsy-proven invasive ductal carcinoma and ductal carcinoma in situ in the 9 o'clock position of the left breast   08/30/2013 Surgery Left Lumpectomy IDC with infiltrating lobular features. Pos for LVSI; 1 mm from nearest margin, DCIS 1 SLN Neg; ONCOTYPE Dx Rec score 17, 10 yr risk of recurrance is 11% with Tamoxifen alone   10/04/2013 - 12/27/2013 Chemotherapy Weekly Taxol/Herceptin x 12; Herceptin maintenance q. 3 weeks until end of August 2016   01/17/2014 - 03/09/2014 Radiation Therapy Adjuvant radiation therapy   03/16/2014 -  Anti-estrogen oral therapy anastrozole 1 mg daily to start 03/30/2014 plan is for 5 years    CHIEF COMPLIANT: last Herceptin, muscle aches and hot flashes related to aromatase inhibitor  INTERVAL HISTORY: Jean [name] is a 54 year old with above-mentioned history  of breast cancer currently receiving adjuvant Herceptin and today is her last Herceptin treatment. She complains of hot flashes and muscle cramps related to anastrozole. She has been working full-time and her activity has been slowly increasing. She continues to have occasional mild nausea.  REVIEW OF SYSTEMS:   Constitutional: Denies fevers, chills or abnormal weight loss Eyes: Denies blurriness of vision Ears, nose, mouth, throat, and face: Denies mucositis or sore throat Respiratory: Denies cough, dyspnea or wheezes Cardiovascular: Denies palpitation, chest discomfort or lower extremity swelling Gastrointestinal:  Occasional nausea Skin: Denies abnormal skin rashes Lymphatics: Denies new lymphadenopathy or easy bruising Neurological:mild peripheral neuropathy Behavioral/Psych: Mood is stable, no new changes  All other systems were reviewed with the patient and are negative.  I have reviewed the past medical history, past surgical history, social history and family history with the patient and they are unchanged from previous note.  ALLERGIES:  is allergic to hydrocodone.  MEDICATIONS:  Current Outpatient Prescriptions  Medication Sig Dispense Refill  . anastrozole (ARIMIDEX) 1 MG tablet Take 1 tablet (1 mg total) by mouth daily. 90 tablet 3  . cholecalciferol (VITAMIN D) 1000 UNITS tablet Take 1,000 Units by mouth daily.    . Cyanocobalamin (VITAMIN B 12 PO) Take 1 tablet by mouth daily.    Marland Kitchen gabapentin (NEURONTIN) 300 MG capsule Take 1 capsule (300 mg total) by mouth at bedtime. 90 capsule 3  . lidocaine-prilocaine (EMLA) cream Apply 1 application topically as needed. 30 g 3  . lisinopril-hydrochlorothiazide (PRINZIDE,ZESTORETIC) 10-12.5 MG per tablet Take 1 tablet  by mouth daily.  0  . Multiple Vitamin (MULTIVITAMIN) tablet Take 1 tablet by mouth daily.    Marland Kitchen omeprazole (PRILOSEC) 40 MG capsule Take 1 capsule (40 mg total) by mouth daily. 30 capsule 3  . ondansetron (ZOFRAN) 8 MG  tablet Take 1 tablet (8 mg total) by mouth every 8 (eight) hours as needed for nausea or vomiting. 30 tablet 0  . venlafaxine XR (EFFEXOR-XR) 37.5 MG 24 hr capsule TAKE ONE CAPSULE BY MOUTH EVERY DAY WITH BREAKFAST 30 capsule 3  . zolpidem (AMBIEN) 5 MG tablet Take 5 mg by mouth at bedtime as needed for sleep.     No current facility-administered medications for this visit.    PHYSICAL EXAMINATION: ECOG PERFORMANCE STATUS: 1 - Symptomatic but completely ambulatory  Filed Vitals:   09/27/14 0942  BP: 113/80  Pulse: 67  Temp: 98.2 F (36.8 C)  Resp: 18   Filed Weights   09/27/14 0942  Weight: 192 lb (87.091 kg)    GENERAL:alert, no distress and comfortable SKIN: skin color, texture, turgor are normal, no rashes or significant lesions EYES: normal, Conjunctiva are pink and non-injected, sclera clear OROPHARYNX:no exudate, no erythema and lips, buccal mucosa, and tongue normal  NECK: supple, thyroid normal size, non-tender, without nodularity LYMPH:  no palpable lymphadenopathy in the cervical, axillary or inguinal LUNGS: clear to auscultation and percussion with normal breathing effort HEART: regular rate & rhythm and no murmurs and no lower extremity edema ABDOMEN:abdomen soft, non-tender and normal bowel sounds Musculoskeletal:muscle cramps and aches  NEURO: alert & oriented x 3 with fluent speech, no focal motor/sensory deficits  LABORATORY DATA:  I have reviewed the data as listed   Chemistry      Component Value Date/Time   NA 140 08/18/2014 0937   NA 142 01/10/2014 1639   K 3.7 08/18/2014 0937   K 3.8 01/10/2014 1639   CL 101 01/10/2014 1639   CO2 27 08/18/2014 0937   CO2 28 01/10/2014 1639   BUN 15.6 08/18/2014 0937   BUN 12 01/10/2014 1639   CREATININE 0.9 08/18/2014 0937   CREATININE 0.80 01/10/2014 1639      Component Value Date/Time   CALCIUM 9.4 08/18/2014 0937   CALCIUM 10.0 01/10/2014 1639   ALKPHOS 81 08/18/2014 0937   ALKPHOS 73 01/10/2014 1639    AST 21 08/18/2014 0937   AST 19 01/10/2014 1639   ALT 15 08/18/2014 0937   ALT 18 01/10/2014 1639   BILITOT 0.21 08/18/2014 0937   BILITOT <0.2* 01/10/2014 1639       Lab Results  Component Value Date   WBC 3.1* 09/27/2014   HGB 10.4* 09/27/2014   HCT 31.9* 09/27/2014   MCV 82.9 09/27/2014   PLT 303 09/27/2014   NEUTROABS 1.4* 09/27/2014   ASSESSMENT & PLAN:  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 Lucia Gaskins, received Taxol Herceptin on 10/04/2013 weekly x12, now on every 3 week Herceptin maintenance, completed adjuvant radiation, started tamoxifen 20 mg daily 03/16/2014, Herceptin maintenance completed August 24th  2016  Current treatment: Anastrozole 1 mg daily started 03/30/2014 plan is for 5 years.  Anastrozole toxicities:  1. Hot flashes: Currently on Effexor, Neurontin 2. fatigue   Anemia due to chemotherapy: Hemoglobin is 10.3 today. Fatigue: Patient is now able to work  full-time and she is working overtime. Nausea: Renewed Zofran Neuropathy grade 1: Takes Neurontin Muscle cramps: Recommended that she take tonic water in the evening to help with this.  Return to clinic in 6 months for follow-up   No orders of the defined types were placed in this encounter.   The patient has a good understanding of the overall plan. she agrees with it. she will call with any problems that may develop before the next visit here.   Rulon Eisenmenger, MD     He'll be reevaluated have a great memory is

## 2014-09-27 NOTE — Telephone Encounter (Signed)
Appointments made and avs printed for patient °

## 2014-09-27 NOTE — Patient Instructions (Addendum)

## 2014-11-05 ENCOUNTER — Other Ambulatory Visit: Payer: Self-pay | Admitting: Hematology and Oncology

## 2014-11-06 NOTE — Telephone Encounter (Signed)
Lst OV 09/27/14.  Next OV 03/10/15.  Chart reviewed.

## 2015-01-15 ENCOUNTER — Other Ambulatory Visit: Payer: Self-pay | Admitting: Surgery

## 2015-01-18 ENCOUNTER — Encounter (HOSPITAL_BASED_OUTPATIENT_CLINIC_OR_DEPARTMENT_OTHER): Payer: Self-pay | Admitting: *Deleted

## 2015-01-18 NOTE — H&P (Signed)
Jean [name]  Location: [facility] Patient #: [id number] DOB: 12-15-1960 Divorced / Language: English / Race: Black or African American Female  History of Present Illness    The patient is a 54 year old female who presents with breast cancer.   Her PCP is Dr. Doreene Burke [name].  She sees Dr. Nicholas [name] and Dr. Lance [name] for oncology. He was originally seen in the breast multidisciplinary clinic.  The patient comes by herself.   She is doing well. She had her last mammogram in 08/03/2014. She is on anastrazole and tolerating this. She has no issues. She came in here in part for follow up and in part to discuss getting her power port out.  The event in her life recently has been the sudden death of her father over Thanksgiving.  History of her breast cancer: Ms. Jean [name] had a left breast lumpectomy and left axillary sentinel lymph node on 08/30/2013 ([id number]) - invasive ductal carcinoma with lobular features, 1 mm from the inferior margin, 0 of 1 lymph node, Her2Neu - positive. ER - 100%, PR - 91%, Ki67 - 47%, HER-2/neu positive She completed one year of Herceptin. She had radiation tx from 01/18/2014 - 03/09/2014.  Past Medical History: 1. Her gynecologist is Dr. Cheri [name]. She had a hysterectomy for fibroids in 2010. 2. Chronic back pain 3. Power port placed - 09/26/2013 - Jean [name] and Family history: She has a Building surveyor, [employer] She works in Psychologist, educational at [employer]. She has 2 boys, ages 37 and 6. Her father died unexpectantly of a MI around Thanksgivinig 2016.  Other Problems Jean Medal, MD; 01/15/2015 3:59 PM) Anxiety Disorder Back Pain Depression Gastroesophageal Reflux Disease  Past Surgical History Jean Medal, MD; 01/15/2015 3:59 PM) Breast Biopsy Left. Breast Mass; Local Excision Left. Foot Surgery Right. Hysterectomy (not due to cancer) - Partial Sentinel Lymph  Node Biopsy  Diagnostic Studies History Jean Medal, MD; 01/15/2015 3:59 PM) Colonoscopy 1-5 years ago Mammogram within last year Pap Smear 1-5 years ago  Allergies (Jean Eversole, LPN; 55/73/2202 5:42 PM) Hydrocodone-Ibuprofen *ANALGESICS - OPIOID*  Medication History (Jean Eversole, LPN; 70/62/3762 8:31 PM) Arimidex ('1MG'$  Tablet, Oral) Active. Vitamin D (Cholecalciferol) (1000UNIT Tablet, Oral) Active. Vitamin B12 TR (1000MCG Tablet ER, Oral) Active. Neurontin ('300MG'$  Capsule, Oral) Active. Lisinopril-Hydrochlorothiazide (10-12.'5MG'$  Tablet, Oral) Active. Multi-Day (Oral) Active. PriLOSEC ('40MG'$  Capsule DR, Oral) Active. Effexor XR (37.'5MG'$  Capsule ER 24HR, Oral) Active. Ambien ('5MG'$  Tablet, Oral daily) Active. Medications Reconciled  Vitals (Jean Eversole LPN; 51/76/1607 3:71 PM) 01/15/2015 3:55 PM Weight: 183.6 lb Height: 67in Body Surface Area: 1.95 m Body Mass Index: 28.76 kg/m  Temp.: 98.32F(Oral)  Pulse: 73 (Regular)  BP: 110/68 (Sitting, Left Arm, Standard)   Physical Exam  General: WN AA F who is doing well.  HEENT: Pupils equal.  NECK: Supple. No thyroid mass.  LYMPH NODES: No cervical, supraclavicular, or axillary adenopathy.  BREASTS - RIGHT: No palpable mass or nodule. No nipple discharge. Power port in upper right chest is okay. LEFT: No palpable mass or nodule. No nipple discharge. Scar at 9 o'clock and left axilla look okay.  UPPER EXTREMITIES: No evidence of lymphedema.  Assessment & Plan  1.  BREAST CANCER (C50.919)  Story: She has a T1, N0 left breast cancer.   Left lumpectomy/SLNBx - 08/30/2013 ([id number]) - IDC with lobular features, 1 mm from inferior margin, 0/1 node, Her2Neu - positive grade 2/3, ER - 100%, PR - 91%, Ki67 - 47%,  The tumor  is HER-2 Neu positive.  Impression: Doing well.   Follow up in 6 months  2.  PORTACATH IN PLACE [id number])  Story: Power port placed 09/26/2013 by Dr.  Lucia [name] Impression: For removal.  3. Her gynecologist is Dr. Cheri [name]. She had a hysterectomy for fibroids in 2010. 4. Chronic back pain  Jean Overall, MD, [facility] Surgery Pager: [phone] Office phone:  [phone]

## 2015-01-19 ENCOUNTER — Encounter (HOSPITAL_BASED_OUTPATIENT_CLINIC_OR_DEPARTMENT_OTHER)
Admission: RE | Disposition: A | Discharge status: Home / Self Care | Payer: Self-pay | Source: Ambulatory Visit | Attending: Surgery

## 2015-01-19 ENCOUNTER — Ambulatory Visit (HOSPITAL_BASED_OUTPATIENT_CLINIC_OR_DEPARTMENT_OTHER)
Admission: RE | Admit: 2015-01-19 | Discharge: 2015-01-19 | Disposition: A | Discharge status: Home / Self Care | Payer: BLUE CROSS/BLUE SHIELD | Source: Ambulatory Visit | Attending: Surgery | Admitting: Surgery

## 2015-01-19 DIAGNOSIS — C50912 Malignant neoplasm of unspecified site of left female breast: Secondary | ICD-10-CM | POA: Diagnosis not present

## 2015-01-19 DIAGNOSIS — F419 Anxiety disorder, unspecified: Secondary | ICD-10-CM | POA: Diagnosis not present

## 2015-01-19 DIAGNOSIS — Z452 Encounter for adjustment and management of vascular access device: Secondary | ICD-10-CM | POA: Insufficient documentation

## 2015-01-19 DIAGNOSIS — F329 Major depressive disorder, single episode, unspecified: Secondary | ICD-10-CM | POA: Insufficient documentation

## 2015-01-19 DIAGNOSIS — Z90710 Acquired absence of both cervix and uterus: Secondary | ICD-10-CM | POA: Insufficient documentation

## 2015-01-19 DIAGNOSIS — K219 Gastro-esophageal reflux disease without esophagitis: Secondary | ICD-10-CM | POA: Diagnosis not present

## 2015-01-19 DIAGNOSIS — Z9221 Personal history of antineoplastic chemotherapy: Secondary | ICD-10-CM | POA: Insufficient documentation

## 2015-01-19 DIAGNOSIS — Z79899 Other long term (current) drug therapy: Secondary | ICD-10-CM | POA: Diagnosis not present

## 2015-01-19 HISTORY — PX: PORT-A-CATH REMOVAL: SHX5289

## 2015-01-19 SURGERY — MINOR REMOVAL PORT-A-CATH
Anesthesia: LOCAL | Site: Chest | Laterality: Left

## 2015-01-19 MED ORDER — CHLORHEXIDINE GLUCONATE 4 % EX LIQD: 1.0000 "application " | Freq: Once | CUTANEOUS | Status: DC

## 2015-01-19 MED ORDER — LIDOCAINE-EPINEPHRINE 1 %-1:100000 IJ SOLN
INTRAMUSCULAR | Status: DC | PRN
  Administered 2015-01-19: 18 mL

## 2015-01-19 MED ORDER — LIDOCAINE-EPINEPHRINE 1 %-1:100000 IJ SOLN
INTRAMUSCULAR | Status: AC
  Filled 2015-01-19: qty 1

## 2015-01-19 SURGICAL SUPPLY — 29 items
BANDAGE ADH SHEER 1  50/CT (GAUZE/BANDAGES/DRESSINGS) IMPLANT
BENZOIN TINCTURE PRP APPL 2/3 (GAUZE/BANDAGES/DRESSINGS) IMPLANT
BLADE SURG 15 STRL LF DISP TIS (BLADE) ×1 IMPLANT
BLADE SURG 15 STRL SS (BLADE) ×1
COVER SURGICAL LIGHT HANDLE (MISCELLANEOUS) ×2 IMPLANT
DRAPE UTILITY XL STRL (DRAPES) ×2 IMPLANT
GAUZE SPONGE 4X4 16PLY XRAY LF (GAUZE/BANDAGES/DRESSINGS) ×2 IMPLANT
GLOVE SURG SIGNA 7.5 PF LTX (GLOVE) ×2 IMPLANT
GLOVE SURG SS PI 7.0 STRL IVOR (GLOVE) ×4 IMPLANT
LIQUID BAND (GAUZE/BANDAGES/DRESSINGS) ×2 IMPLANT
MARKER SKIN DUAL TIP RULER LAB (MISCELLANEOUS) IMPLANT
NDL SAFETY ECLIPSE 18X1.5 (NEEDLE) ×1 IMPLANT
NEEDLE HYPO 18GX1.5 SHARP (NEEDLE) ×1
NEEDLE HYPO 25X1 1.5 SAFETY (NEEDLE) IMPLANT
NEEDLE HYPO 25X5/8 SAFETYGLIDE (NEEDLE) ×2 IMPLANT
NS IRRIG 1000ML POUR BTL (IV SOLUTION) IMPLANT
SPONGE GAUZE 4X4 12PLY STER LF (GAUZE/BANDAGES/DRESSINGS) IMPLANT
STRIP CLOSURE SKIN 1/2X4 (GAUZE/BANDAGES/DRESSINGS) IMPLANT
STRIP CLOSURE SKIN 1/4X4 (GAUZE/BANDAGES/DRESSINGS) IMPLANT
SUT MNCRL AB 4-0 PS2 18 (SUTURE) ×2 IMPLANT
SUT VIC AB 3-0 SH 27 (SUTURE) ×1
SUT VIC AB 3-0 SH 27X BRD (SUTURE) ×1 IMPLANT
SUT VIC AB 5-0 PS2 18 (SUTURE) IMPLANT
SUT VICRYL 3-0 CR8 SH (SUTURE) IMPLANT
SUT VICRYL 4-0 PS2 18IN ABS (SUTURE) IMPLANT
SWABSTICK POVIDONE IODINE SNGL (MISCELLANEOUS) ×4 IMPLANT
SYR CONTROL 10ML LL (SYRINGE) ×2 IMPLANT
TOWEL OR NON WOVEN STRL DISP B (DISPOSABLE) ×2 IMPLANT
UNDERPAD 30X30 (UNDERPADS AND DIAPERS) IMPLANT

## 2015-01-19 NOTE — Op Note (Signed)
01/19/2015  4:18 PM  PATIENT:  Jean [name], 54 y.o., female, MRN: [id number]  PREOP DIAGNOSIS:  Completion of chemotherapy, left breast cancer  POSTOP DIAGNOSIS:   Completion of chemotherapy, left breast cancer  PROCEDURE:   Procedure(s):  MINOR REMOVAL PORT-A-CATH  SURGEON:   [name], M.D.   ANESTHESIA: Local  EBL:  minimal  ml  LOCAL MEDICATIONS USED:   17 cc 1% xylocaine with epi  SPECIMEN:   None  COUNTS CORRECT:  YES  INDICATIONS FOR PROCEDURE:  [name] is a 54 y.o. (DOB: 02-26-1960) AA  female whose primary care physician is Gerrit Heck, MD and comes for power port removal.   She has completed her chemotherapy supervised by Dr. Lindi Adie.   The indications and risks of the surgery were explained to the patient.  The risks include, but are not limited to, infection, bleeding, and nerve injury.  PROCEDURE:  The patient was taken to room #1 and her right upper chest was prepped with betadine.   A time out was held and the surgical check list run.  The skin was infiltrated with 17 cc of 1 % xylocaine. An incision made over the port and the port removed in its entirety. The subcutaneous tissues were closed with 3-0 Vicryl. The skin was closed with 4-0 Monocryl. The skin was painted with LiquidBand.  She tolerated the procedure well. She will see me in 2 to 4 weeks, if necessary. Otherwise I will see her back in 6 months.  [name], MD, [facility] Surgery Pager: [phone] Office phone:  [phone]

## 2015-01-19 NOTE — Interval H&P Note (Signed)
History and Physical Interval Note:  01/19/2015 3:46 PM  Jean [name]  has presented today for surgery, with the diagnosis of Completion of chemotherapy, left breast cancer  The various methods of treatment have been discussed with the patient and family.   [name] is here by herself.  After consideration of risks, benefits and other options for treatment, the patient has consented to  Procedure(s): MINOR REMOVAL PORT-A-CATH (N/A) as a surgical intervention .  The patient's history has been reviewed, patient examined, no change in status, stable for surgery.  I have reviewed the patient's chart and labs.  Questions were answered to the patient's satisfaction.     Amritpal Shropshire H

## 2015-01-19 NOTE — Discharge Instructions (Signed)
[facility] - DISCHARGE INSTRUCTIONS TO PATIENT  Activity:  Driving - May drive   Lifting - No limit  Wound Care:   Leave dry for 24 hours  Diet:  As tolerated  Follow up appointment:  Call Dr. Pollie Friar office [facility]) at [phone] for an appointment in 6 months.  Call earlier if there is a problem.  Medications and dosages:  Resume your home medications.  Call Dr. Lucia Gaskins or his office  [phone]) if you have:  Redness, tenderness, or signs of infection (pain, swelling, redness, odor or green/yellow discharge around the site),  Any other questions or concerns you may have after discharge.  In an emergency, call 911 or go to an Emergency Department at a nearby hospital.

## 2015-01-22 ENCOUNTER — Encounter (HOSPITAL_BASED_OUTPATIENT_CLINIC_OR_DEPARTMENT_OTHER): Payer: Self-pay | Admitting: Surgery

## 2015-03-08 IMAGING — US US BREAST LTD UNI LEFT INC AXILLA
1 series · 13 of 13 positions shown · non-contrast
Comparison: Previous examinations, including the screening
mammogram dated 07/20/2013.

CLINICAL DATA: Possible masses in both breasts at recent screening
mammography.

EXAM:
DIGITAL DIAGNOSTIC  BILATERAL MAMMOGRAM WITH CAD
ULTRASOUND LEFT BREAST

[Series 1: superficial breast · 13 of 13 slices shown]
[im 1/13]
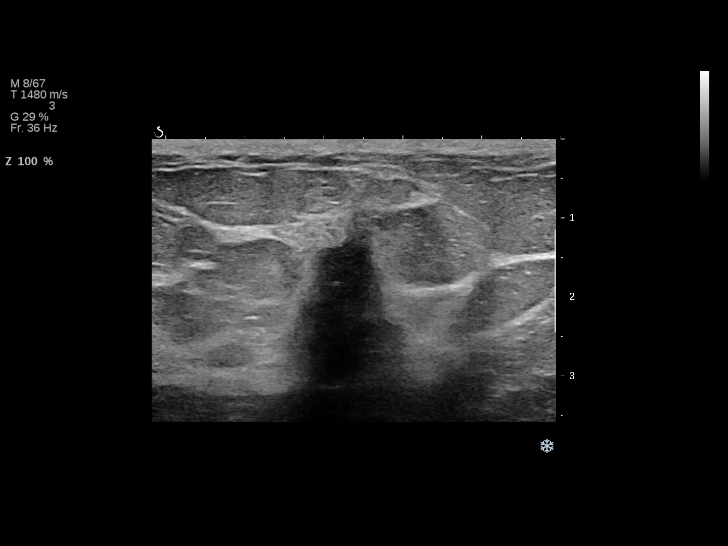
[im 2/13]
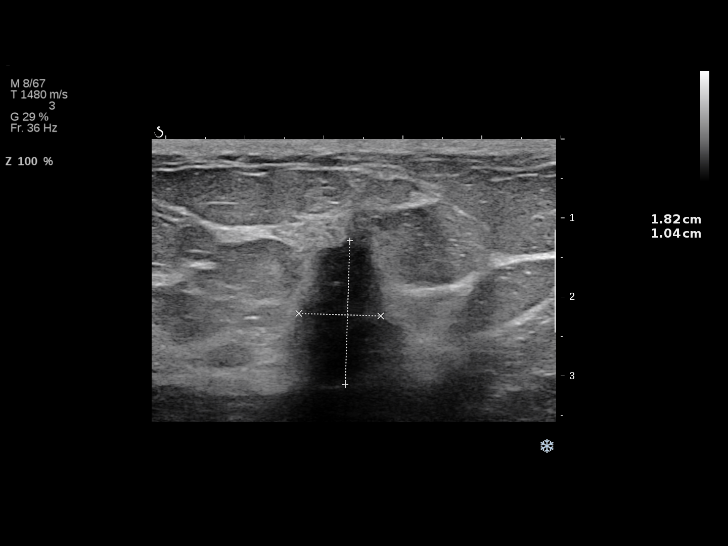
[im 3/13]
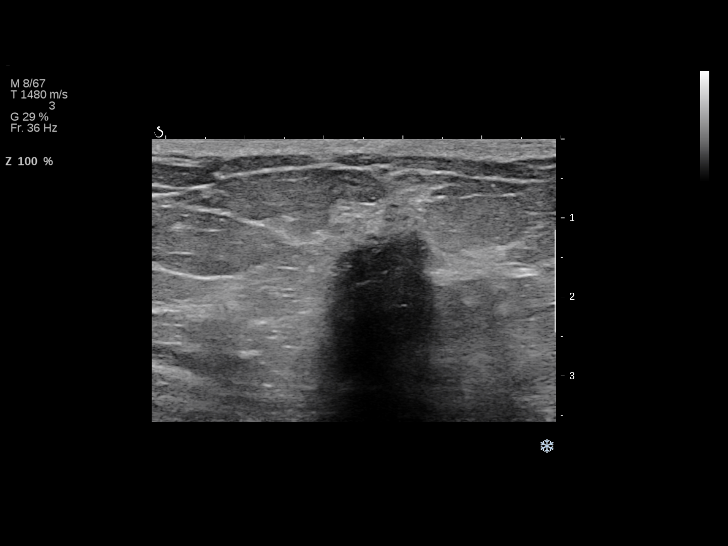
[im 4/13]
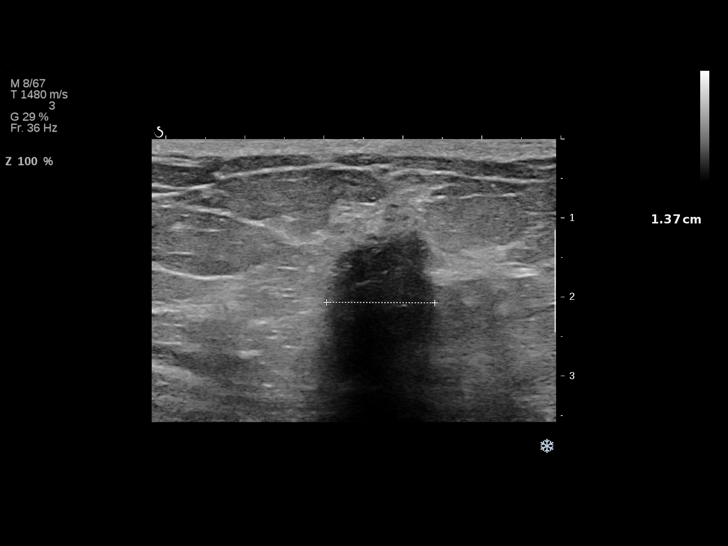
[im 5/13]
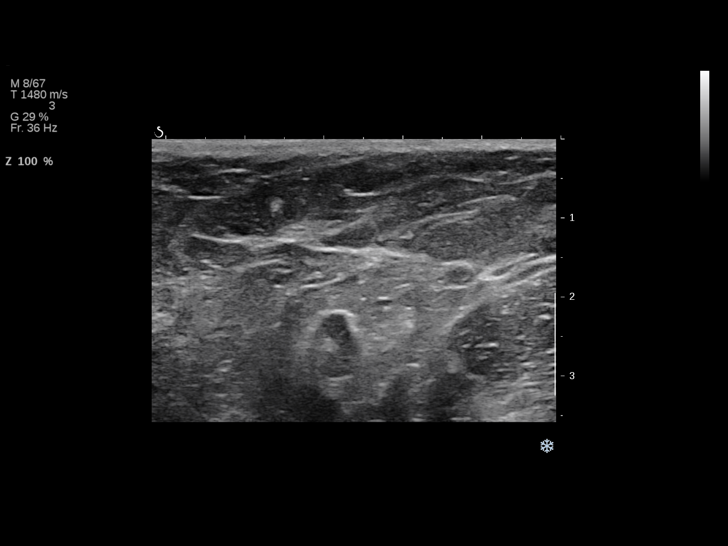
[im 6/13]
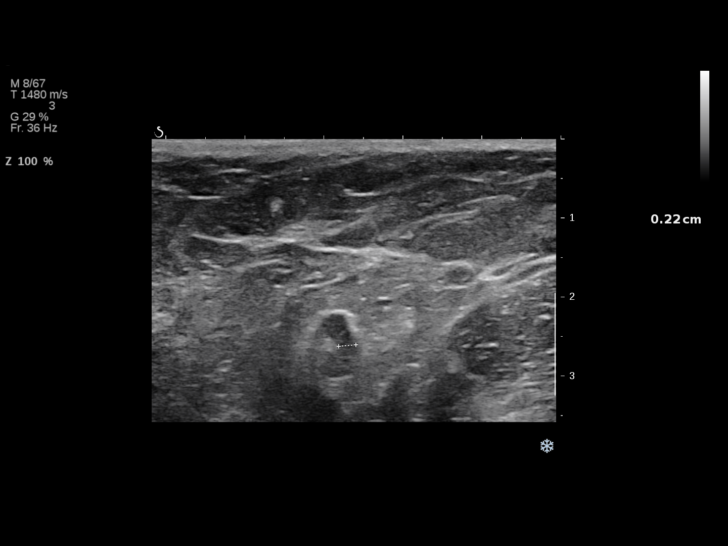
[im 7/13]
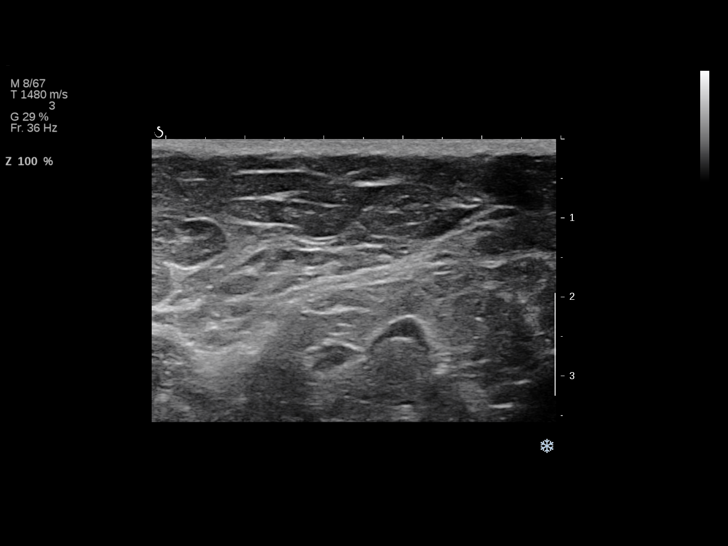
[im 8/13]
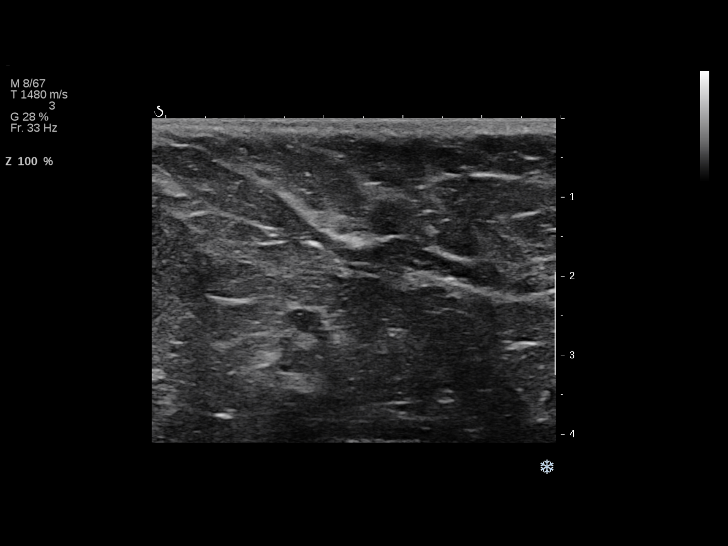
[im 9/13]
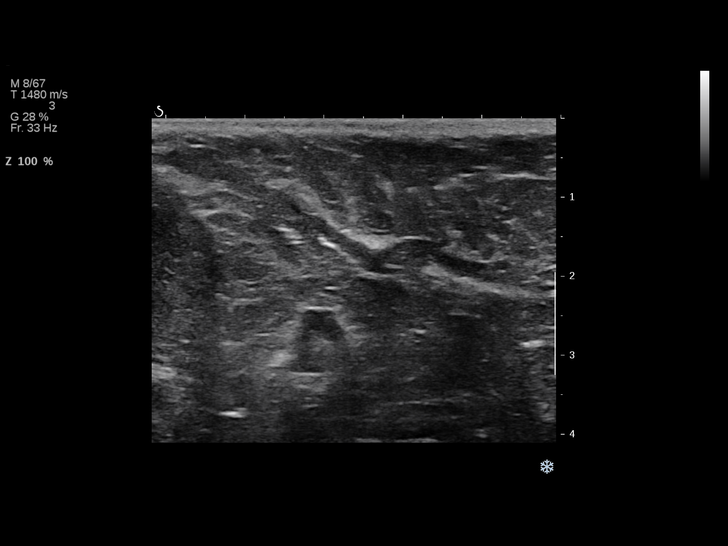
[im 10/13]
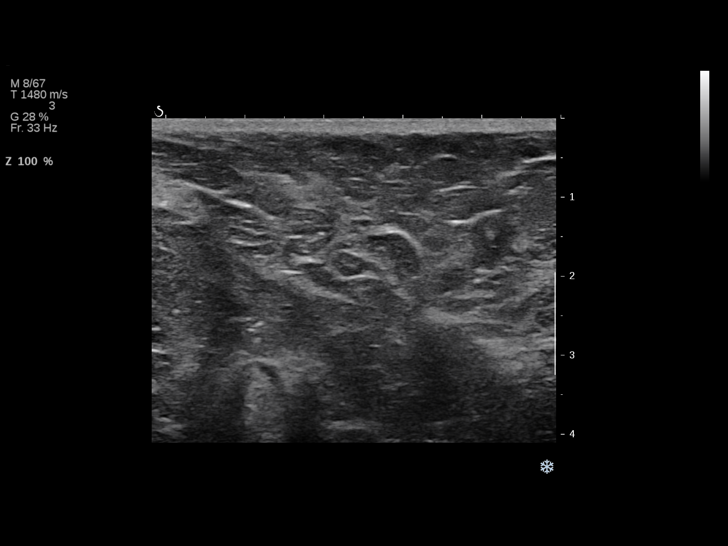
[im 11/13]
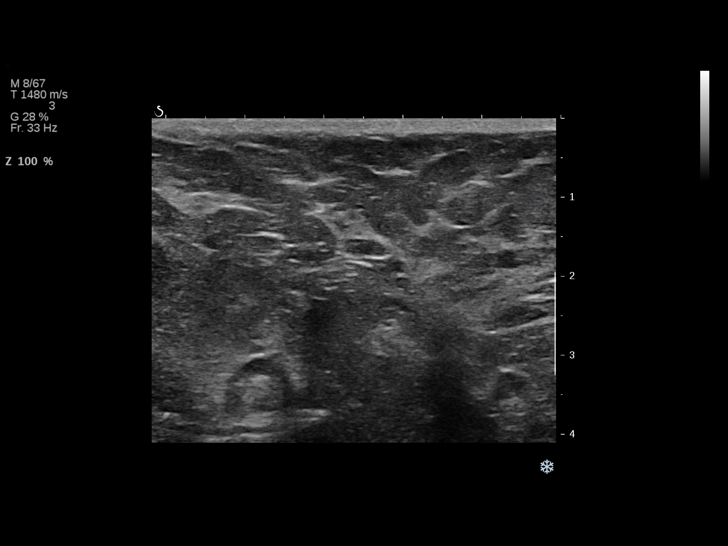
[im 12/13]
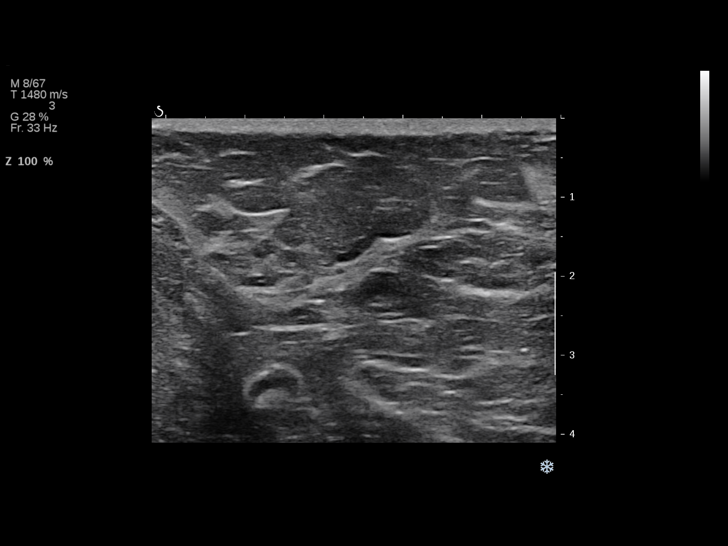
[im 13/13]
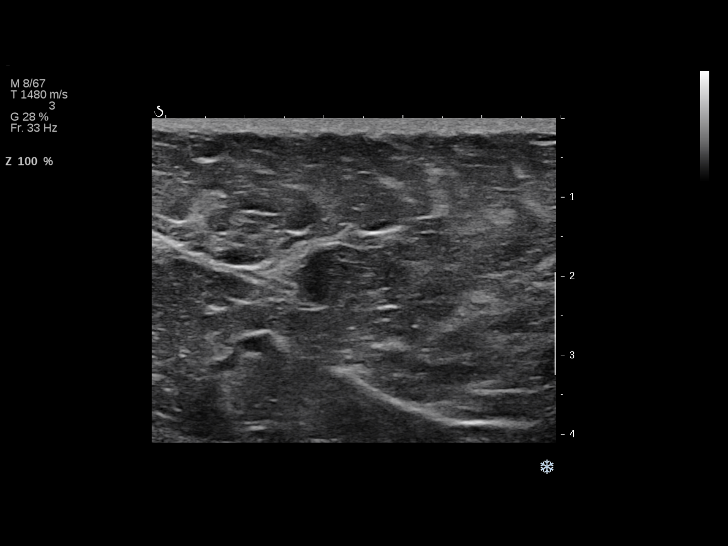

[13 of 13 positions shown; findings below may reference images not displayed]

ACR Breast Density Category c: The breast tissue is heterogeneously
dense, which may obscure small masses.
FINDINGS: True lateral and spot compression views of the right breast
demonstrate normal appearing fibroglandular tissue at the locations
of the recently suspected masses.

Spot compression and spot magnification views of the left breast
confirm an oval area of increased density with poorly defined
margins and associated microcalcifications in the medial aspect of
the breast. Some of the calcifications are arranged in a linear
fashion and the calcifications are predominantly oval in
configuration.

Mammographic images were processed with CAD.

On physical exam, the patient has an approximately 2 cm oval,
faintly palpable mass in the 9 o'clock position of the left breast,
5 cm from the nipple. No palpable left axillary lymph nodes.

Ultrasound is performed, showing a 1.8 x 1.4 x 1.0 cm oval,
vertically oriented, hypoechoic mass containing microcalcifications
in the 9 o'clock position of the left breast, 5 cm from the nipple.
This has poorly defined margins and exhibits dense posterior
acoustical shadowing.

Normal appearing left axillary lymph nodes were demonstrated.
IMPRESSION: 1.8 cm mass with mammographic and ultrasound features suspicious for
malignancy in the 9 o'clock position of the left breast. The
recently suspected right breast masses represented overlapping of
normal tissue.

RECOMMENDATION:
Left breast ultrasound-guided core needle biopsy. This has been
scheduled for 11 a.m. on 08/09/2013.

I have discussed the findings and recommendations with the patient.
Results were also provided in writing at the conclusion of the
visit. If applicable, a reminder letter will be sent to the patient
regarding the next appointment.

BI-RADS CATEGORY  4: Suspicious.

## 2015-03-12 ENCOUNTER — Other Ambulatory Visit: Payer: Self-pay | Admitting: Hematology and Oncology

## 2015-03-12 DIAGNOSIS — C50412 Malignant neoplasm of upper-outer quadrant of left female breast: Secondary | ICD-10-CM

## 2015-03-29 NOTE — Assessment & Plan Note (Signed)
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 Lucia Gaskins, received Taxol Herceptin on 10/04/2013 weekly x12, now on every 3 week Herceptin maintenance, completed adjuvant radiation, started tamoxifen 20 mg daily 03/16/2014, Herceptin maintenance completed August 24th 2016  Current treatment: Anastrozole 1 mg daily started 03/30/2014 plan is for 5 years.  Anastrozole toxicities:  1. Hot flashes: Currently on Effexor, Neurontin 2. fatigue   Anemia due to chemotherapy: Hemoglobin is today. Fatigue: Patient is now able to work full-time and she is working overtime. Nausea: On Zofran Neuropathy grade 1: Takes Neurontin Muscle cramps: Recommended that she take tonic water in the evening to help with this.  Return to clinic in 6 months for follow-up

## 2015-03-30 ENCOUNTER — Ambulatory Visit (HOSPITAL_BASED_OUTPATIENT_CLINIC_OR_DEPARTMENT_OTHER): Payer: Managed Care, Other (non HMO) | Admitting: Hematology and Oncology

## 2015-03-30 ENCOUNTER — Other Ambulatory Visit (HOSPITAL_BASED_OUTPATIENT_CLINIC_OR_DEPARTMENT_OTHER): Payer: Managed Care, Other (non HMO)

## 2015-03-30 ENCOUNTER — Telehealth: Payer: Self-pay | Admitting: Hematology and Oncology

## 2015-03-30 ENCOUNTER — Encounter: Payer: Self-pay | Admitting: Hematology and Oncology

## 2015-03-30 VITALS — BP 97/71 | HR 74 | Temp 98.1°F | Resp 18 | Ht 66.0 in | Wt 183.9 lb

## 2015-03-30 DIAGNOSIS — G629 Polyneuropathy, unspecified: Secondary | ICD-10-CM

## 2015-03-30 DIAGNOSIS — D6481 Anemia due to antineoplastic chemotherapy: Secondary | ICD-10-CM

## 2015-03-30 DIAGNOSIS — Z170 Estrogen receptor positive status [ER+]: Secondary | ICD-10-CM

## 2015-03-30 DIAGNOSIS — C50812 Malignant neoplasm of overlapping sites of left female breast: Secondary | ICD-10-CM | POA: Diagnosis not present

## 2015-03-30 DIAGNOSIS — C50412 Malignant neoplasm of upper-outer quadrant of left female breast: Secondary | ICD-10-CM

## 2015-03-30 LAB — CBC WITH DIFFERENTIAL/PLATELET
BASO%: 0.8 % (ref 0.0–2.0)
Basophils Absolute: 0 10*3/uL (ref 0.0–0.1)
EOS ABS: 0.1 10*3/uL (ref 0.0–0.5)
EOS%: 1.9 % (ref 0.0–7.0)
HEMATOCRIT: 33.7 % — AB (ref 34.8–46.6)
HEMOGLOBIN: 10.8 g/dL — AB (ref 11.6–15.9)
LYMPH%: 41.9 % (ref 14.0–49.7)
MCH: 26.4 pg (ref 25.1–34.0)
MCHC: 32.2 g/dL (ref 31.5–36.0)
MCV: 82.1 fL (ref 79.5–101.0)
MONO#: 0.3 10*3/uL (ref 0.1–0.9)
MONO%: 8 % (ref 0.0–14.0)
NEUT#: 1.5 10*3/uL (ref 1.5–6.5)
NEUT%: 47.4 % (ref 38.4–76.8)
PLATELETS: 361 10*3/uL (ref 145–400)
RBC: 4.1 10*6/uL (ref 3.70–5.45)
RDW: 14.3 % (ref 11.2–14.5)
WBC: 3.2 10*3/uL — AB (ref 3.9–10.3)
lymph#: 1.4 10*3/uL (ref 0.9–3.3)

## 2015-03-30 LAB — COMPREHENSIVE METABOLIC PANEL
ALBUMIN: 3.8 g/dL (ref 3.5–5.0)
ALK PHOS: 72 U/L (ref 40–150)
ALT: 15 U/L (ref 0–55)
ANION GAP: 8 meq/L (ref 3–11)
AST: 20 U/L (ref 5–34)
BUN: 17.7 mg/dL (ref 7.0–26.0)
CALCIUM: 9.5 mg/dL (ref 8.4–10.4)
CHLORIDE: 104 meq/L (ref 98–109)
CO2: 27 mEq/L (ref 22–29)
CREATININE: 0.9 mg/dL (ref 0.6–1.1)
EGFR: 82 mL/min/{1.73_m2} — ABNORMAL LOW (ref >90)
Glucose: 111 mg/dl (ref 70–140)
POTASSIUM: 3.9 meq/L (ref 3.5–5.1)
Sodium: 140 mEq/L (ref 136–145)
Total Bilirubin: <0.3 mg/dL (ref 0.20–1.20)
Total Protein: 7.6 g/dL (ref 6.4–8.3)

## 2015-03-30 NOTE — Progress Notes (Signed)
Unable to get in to exam room prior to MD.  No assessment performed.  

## 2015-03-30 NOTE — Progress Notes (Signed)
Patient Care Team: Leighton Ruff, MD as PCP - General (Family Medicine) [name], MD as Consulting Physician (General Surgery) Thea Silversmith, MD as Consulting Physician (Radiation Oncology) Gavin Pound, MD as Consulting Physician (Family Medicine) Nicholas Lose, MD as Consulting Physician (Hematology and Oncology)  DIAGNOSIS: Breast cancer of upper-outer quadrant of left female breast [facility])   Staging form: Breast, AJCC 7th Edition     Clinical: Stage IIA (T2, N0, cM0) - Signed by Rulon Eisenmenger, MD on 09/20/2013       Staging comments: Staged at breast conference 08/17/13.      Pathologic: T1c, N0, cM0 - Unsigned   SUMMARY OF ONCOLOGIC HISTORY:   Breast cancer of upper-outer quadrant of left female breast ([place])   07/22/2013 Mammogram 1.8 cm mass with mammographic and ultrasound features suspicious for malignancy in the 9 o'clock position of the left breast   08/12/2013 Initial Diagnosis Breast cancer of upper-outer quadrant of left female breast.  invasive ductal carcinoma plus DCIS.  The cancer was grade 2 and ER positive at 100%, PR+ at 91% and HER2- with a Ki67 of 47%   08/12/2013 Breast MRI 2.1 x 1.2 x 1.2 cm biopsy-proven invasive ductal carcinoma and ductal carcinoma in situ in the 9 o'clock position of the left breast   08/30/2013 Surgery Left Lumpectomy IDC with infiltrating lobular features. Pos for LVSI; 1 mm from nearest margin, DCIS 1 SLN Neg; ONCOTYPE Dx Rec score 17, 10 yr risk of recurrance is 11% with Tamoxifen alone   10/04/2013 - 12/27/2013 Chemotherapy Weekly Taxol/Herceptin x 12; Herceptin maintenance q. 3 weeks until end of August 2016   01/17/2014 - 03/09/2014 Radiation Therapy Adjuvant radiation therapy   03/16/2014 -  Anti-estrogen oral therapy anastrozole 1 mg daily to start 03/30/2014 plan is for 5 years    CHIEF COMPLIANT: Follow-up on anastrozole  INTERVAL HISTORY: Jean [name] is a 55 year old with above-mentioned history of left breast cancer currently on  adjuvant antiestrogen therapy with anastrozole. She appears to be tolerating it fairly well. She does take Effexor for hot flashes as well as mood swings. Her emotional state is much improved. She does not have tearfulness are episodes of emotional distress. She has a lot of stress going on with her father passing away and she is helping to take care of her mother. She had also lost her job and will need to find a new job. She is very stressed out about all of this. In addition to this she has severe fatigue. She also has trouble with remembering and memory problems. She is worried about finding a new job with some memory issues that she is currently having.  REVIEW OF SYSTEMS:   Constitutional: Denies fevers, chills or abnormal weight loss Eyes: Denies blurriness of vision Ears, nose, mouth, throat, and face: Denies mucositis or sore throat Respiratory: Denies cough, dyspnea or wheezes Cardiovascular: Denies palpitation, chest discomfort Gastrointestinal:  Denies nausea, heartburn or change in bowel habits Skin: Denies abnormal skin rashes Lymphatics: Denies new lymphadenopathy or easy bruising Neurological: Neuropathy in fingers and toes especially the toes she has cramping and pain especially at night. Behavioral/Psych: Mood is stable, no new changes  Extremities: No lower extremity edema Breast:  denies any pain or lumps or nodules in either breasts All other systems were reviewed with the patient and are negative.  I have reviewed the past medical history, past surgical history, social history and family history with the patient and they are unchanged from previous note.  ALLERGIES:  is allergic to hydrocodone.  MEDICATIONS:  Current Outpatient Prescriptions  Medication Sig Dispense Refill  . anastrozole (ARIMIDEX) 1 MG tablet Take 1 tablet (1 mg total) by mouth daily. 90 tablet 3  . cholecalciferol (VITAMIN D) 1000 UNITS tablet Take 1,000 Units by mouth daily.    . Cyanocobalamin  (VITAMIN B 12 PO) Take 1 tablet by mouth daily.    Marland Kitchen gabapentin (NEURONTIN) 300 MG capsule Take 1 capsule (300 mg total) by mouth at bedtime. 90 capsule 3  . lidocaine-prilocaine (EMLA) cream Apply 1 application topically as needed. 30 g 3  . lisinopril-hydrochlorothiazide (PRINZIDE,ZESTORETIC) 10-12.5 MG per tablet Take 1 tablet by mouth daily.  0  . Multiple Vitamin (MULTIVITAMIN) tablet Take 1 tablet by mouth daily.    Marland Kitchen omeprazole (PRILOSEC) 40 MG capsule Take 1 capsule (40 mg total) by mouth daily. 30 capsule 3  . ondansetron (ZOFRAN) 8 MG tablet Take 1 tablet (8 mg total) by mouth every 8 (eight) hours as needed for nausea or vomiting. 30 tablet 0  . venlafaxine XR (EFFEXOR-XR) 37.5 MG 24 hr capsule TAKE ONE CAPSULE BY MOUTH EVERY DAY WITH BREAKFAST 90 capsule 3  . zolpidem (AMBIEN) 5 MG tablet Take 5 mg by mouth at bedtime as needed for sleep.     No current facility-administered medications for this visit.    PHYSICAL EXAMINATION: ECOG PERFORMANCE STATUS: 1 - Symptomatic but completely ambulatory  Filed Vitals:   03/30/15 1030  BP: 97/71  Pulse: 74  Temp: 98.1 F (36.7 C)  Resp: 18   Filed Weights   03/30/15 1030  Weight: 183 lb 14.4 oz (83.416 kg)    GENERAL:alert, no distress and comfortable SKIN: skin color, texture, turgor are normal, no rashes or significant lesions EYES: normal, Conjunctiva are pink and non-injected, sclera clear OROPHARYNX:no exudate, no erythema and lips, buccal mucosa, and tongue normal  NECK: supple, thyroid normal size, non-tender, without nodularity LYMPH:  no palpable lymphadenopathy in the cervical, axillary or inguinal LUNGS: clear to auscultation and percussion with normal breathing effort HEART: regular rate & rhythm and no murmurs and no lower extremity edema ABDOMEN:abdomen soft, non-tender and normal bowel sounds MUSCULOSKELETAL:no cyanosis of digits and no clubbing  NEURO: alert & oriented x 3 with fluent speech, Neuropathy grade  1 EXTREMITIES: No lower extremity edema BREAST: No palpable masses or nodules in either right or left breasts. No palpable axillary supraclavicular or infraclavicular adenopathy no breast tenderness or nipple discharge. (exam performed in the presence of a chaperone)  LABORATORY DATA:  I have reviewed the data as listed   Chemistry      Component Value Date/Time   NA 141 09/27/2014 0917   NA 142 01/10/2014 1639   K 3.8 09/27/2014 0917   K 3.8 01/10/2014 1639   CL 101 01/10/2014 1639   CO2 26 09/27/2014 0917   CO2 28 01/10/2014 1639   BUN 22.0 09/27/2014 0917   BUN 12 01/10/2014 1639   CREATININE 0.9 09/27/2014 0917   CREATININE 0.80 01/10/2014 1639      Component Value Date/Time   CALCIUM 9.5 09/27/2014 0917   CALCIUM 10.0 01/10/2014 1639   ALKPHOS 80 09/27/2014 0917   ALKPHOS 73 01/10/2014 1639   AST 26 09/27/2014 0917   AST 19 01/10/2014 1639   ALT 20 09/27/2014 0917   ALT 18 01/10/2014 1639   BILITOT 0.21 09/27/2014 0917   BILITOT <0.2* 01/10/2014 1639       Lab Results  Component Value Date   WBC  3.2* 03/30/2015   HGB 10.8* 03/30/2015   HCT 33.7* 03/30/2015   MCV 82.1 03/30/2015   PLT 361 03/30/2015   NEUTROABS 1.5 03/30/2015   ASSESSMENT & PLAN:  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 Lucia Gaskins, received Taxol Herceptin on 10/04/2013 weekly x12, now on every 3 week Herceptin maintenance, completed adjuvant radiation, started tamoxifen 20 mg daily 03/16/2014, Herceptin maintenance completed August 24th 2016  Current treatment: Anastrozole 1 mg daily started 03/30/2014 plan is for 5 years.  Anastrozole toxicities:  1. Hot flashes: Currently on Effexor, Neurontin 2. fatigue: Partly related to antiestrogen therapy and partly  related to anemia as well as some deconditioning.  3. 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 4. Muscle cramps: Recommended that she take tonic water in the evening to help with this. But she had diarrhea and discontinued it.  Anemia due to chemotherapy: Hemoglobin is 10.8 g today.  Neuropathy grade 1: Takes Neurontin Return to clinic in 6 months for follow-up with labs.   No orders of the defined types were placed in this encounter.   The patient has a good understanding of the overall plan. she agrees with it. she will call with any problems that may develop before the next visit here.   Rulon Eisenmenger, MD 03/30/2015

## 2015-03-30 NOTE — Telephone Encounter (Signed)
Gave patient avs report and appointments for August.  °

## 2015-04-01 ENCOUNTER — Other Ambulatory Visit: Payer: Self-pay | Admitting: Hematology and Oncology

## 2015-04-02 ENCOUNTER — Other Ambulatory Visit: Payer: Self-pay | Admitting: *Deleted

## 2015-04-02 DIAGNOSIS — C50412 Malignant neoplasm of upper-outer quadrant of left female breast: Secondary | ICD-10-CM

## 2015-04-02 MED ORDER — ANASTROZOLE 1 MG PO TABS: 1.0000 mg | ORAL_TABLET | Freq: Every day | ORAL | Status: DC

## 2015-04-05 IMAGING — MG MM LT PLC BREAST LOC DEV   1ST LESION  INC MAMMO GUIDE
2 series · 2 of 2 positions shown · non-contrast
Comparison: Previous exam(s)

CLINICAL DATA: Patient with recent diagnosis left breast invasive
ductal carcinoma and ductal carcinoma in situ. For preoperative
localization prior to left breast lumpectomy.

EXAM:
MAMMOGRAPHIC GUIDED RADIOACTIVE SEED LOCALIZATION OF THE LEFT BREAST

[L CC]
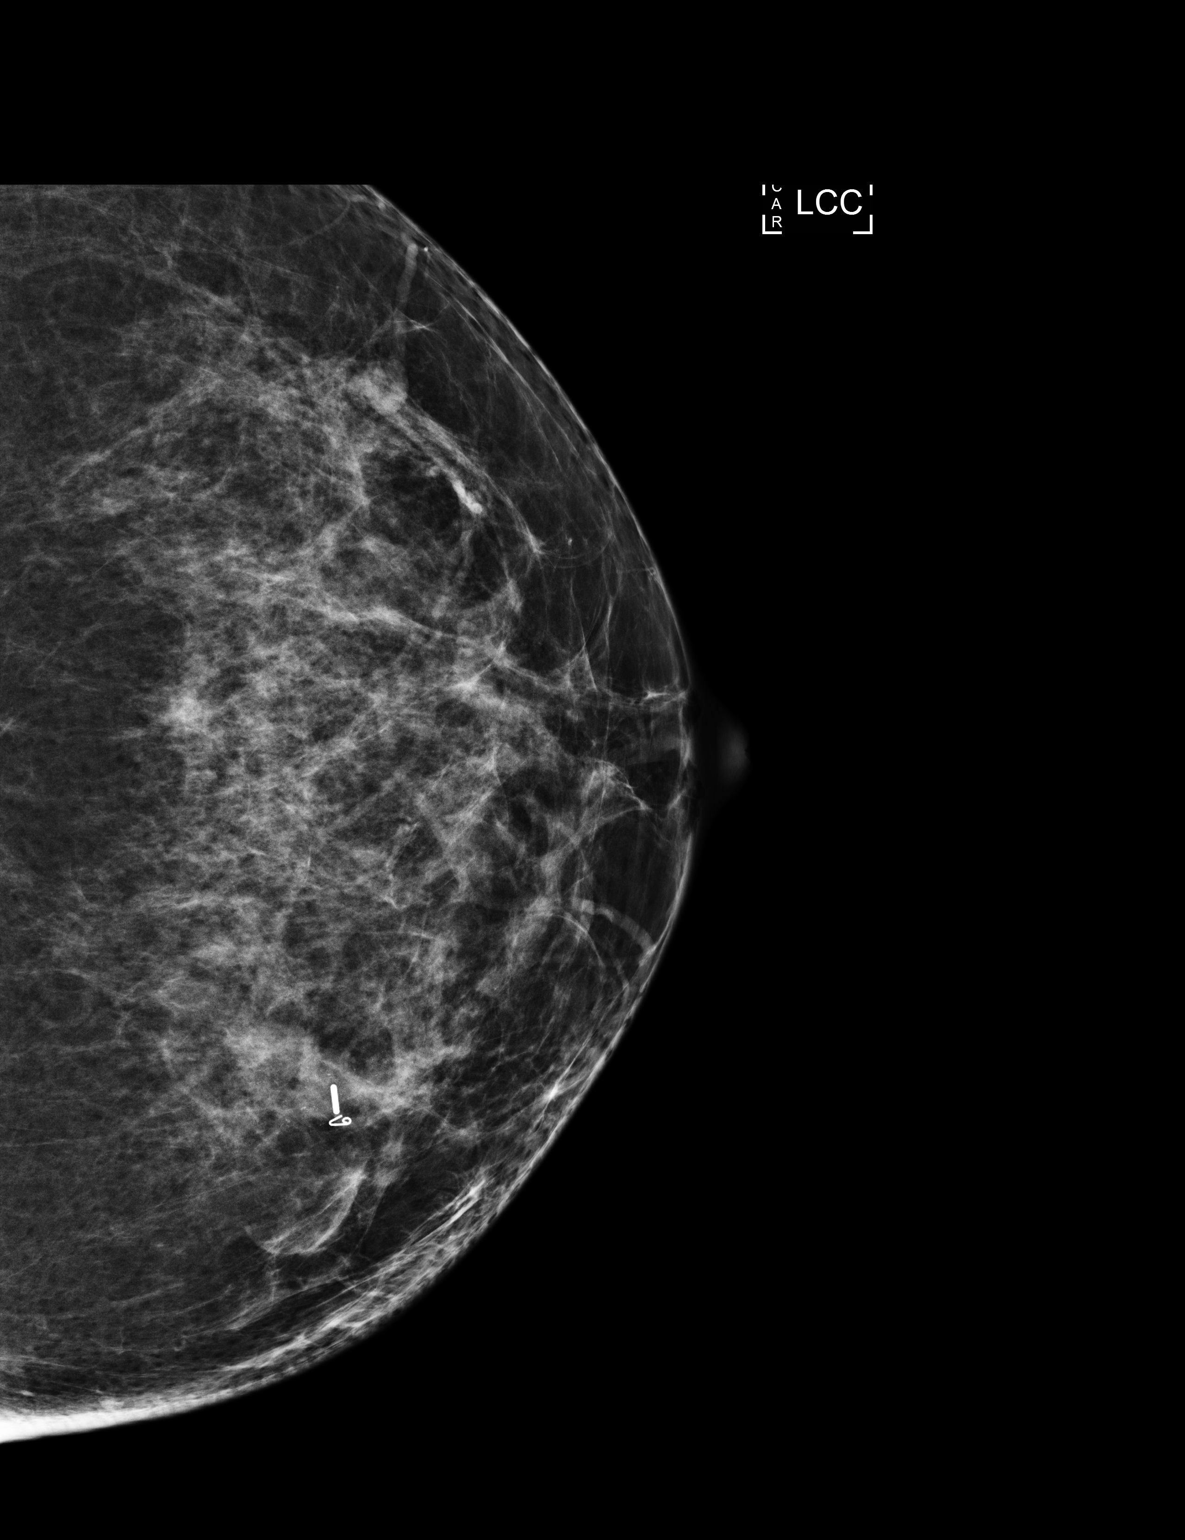

[L ML]
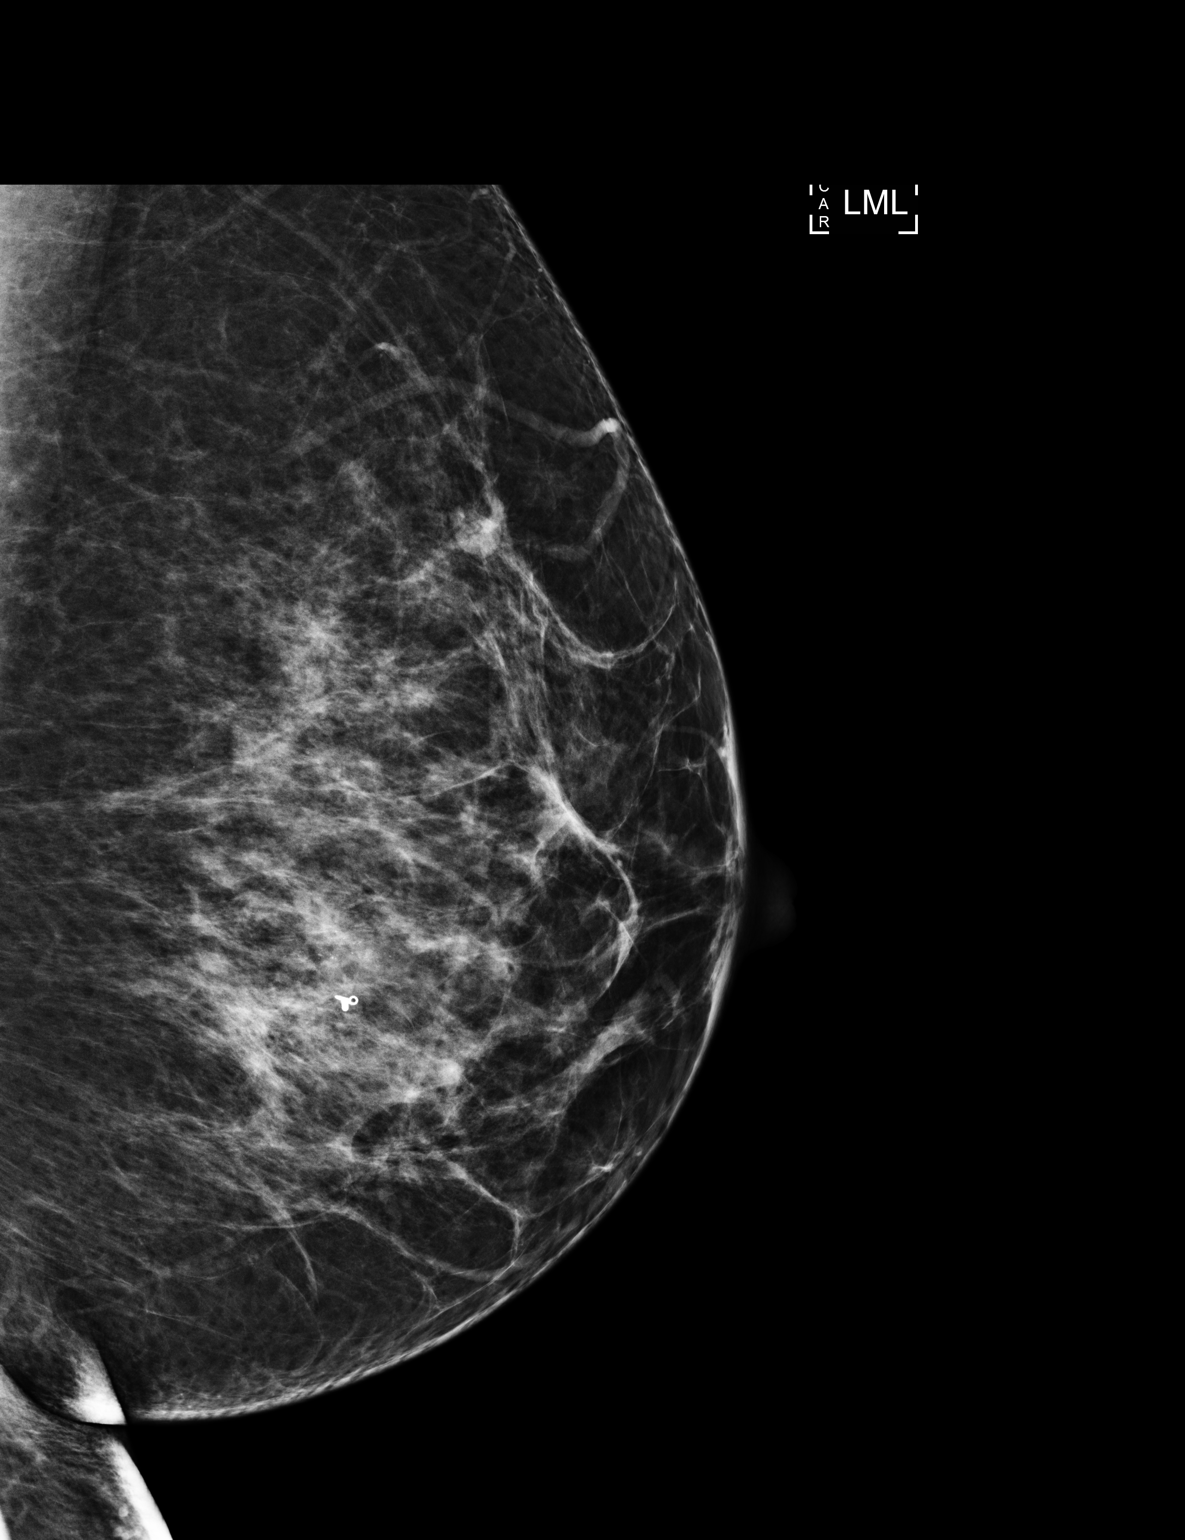

[2 of 2 positions shown; findings below may reference images not displayed]

FINDINGS: Patient presents for radioactive seed localization prior to left
breast lumpectomy. I met with the patient and we discussed the
procedure of seed localization including benefits and alternatives.
We discussed the high likelihood of a successful procedure. We
discussed the risks of the procedure including infection, bleeding,
tissue injury and further surgery. We discussed the low dose of
radioactivity involved in the procedure. Informed, written consent
was given.

The usual time-out protocol was performed immediately prior to the
procedure.

Using mammographic guidance, sterile technique, 2% lidocaine and an
[id number] radioactive seed, the mass and wing shaped marking clip were
localized using a medial approach. The follow-up mammogram images
confirm the seed in the expected location and are marked for Dr.
Fontes.

Follow-up survey of the patient confirms presence of radioactive
seed.

Order number of [id number] seed:  [id number].

Dose of L-[id number] seed:  0.252 mCi.

The patient tolerated the procedure well and was released from the
[REDACTED]. She was given instructions regarding seed removal.
IMPRESSION: Radioactive seed localization left breast. No apparent
complications.

## 2015-08-14 ENCOUNTER — Encounter: Payer: Self-pay | Admitting: Medical Oncology

## 2015-09-27 ENCOUNTER — Telehealth: Payer: Self-pay | Admitting: Hematology and Oncology

## 2015-09-27 ENCOUNTER — Other Ambulatory Visit: Payer: Self-pay | Admitting: Obstetrics and Gynecology

## 2015-09-27 DIAGNOSIS — Z853 Personal history of malignant neoplasm of breast: Secondary | ICD-10-CM

## 2015-09-27 NOTE — Telephone Encounter (Signed)
PATIENT CALLED TO [facility] APPT. 09/27/15

## 2015-09-28 ENCOUNTER — Ambulatory Visit: Payer: Managed Care, Other (non HMO) | Admitting: Hematology and Oncology

## 2015-09-28 ENCOUNTER — Other Ambulatory Visit: Payer: Managed Care, Other (non HMO)

## 2015-10-12 ENCOUNTER — Telehealth: Payer: Self-pay | Admitting: Hematology and Oncology

## 2015-10-12 NOTE — Telephone Encounter (Signed)
Spoke with pt to confirm r/s appts per [facility] due to upcoming weather

## 2015-10-15 ENCOUNTER — Other Ambulatory Visit: Payer: Self-pay | Admitting: Hematology and Oncology

## 2015-10-15 ENCOUNTER — Other Ambulatory Visit: Payer: Managed Care, Other (non HMO)

## 2015-10-15 ENCOUNTER — Ambulatory Visit: Payer: Managed Care, Other (non HMO) | Admitting: Hematology and Oncology

## 2015-10-15 DIAGNOSIS — C50412 Malignant neoplasm of upper-outer quadrant of left female breast: Secondary | ICD-10-CM

## 2015-10-17 ENCOUNTER — Ambulatory Visit
Admission: RE | Admit: 2015-10-17 | Discharge: 2015-10-17 | Disposition: A | Discharge status: Home / Self Care | Payer: Managed Care, Other (non HMO) | Source: Ambulatory Visit | Attending: Obstetrics and Gynecology | Admitting: Obstetrics and Gynecology

## 2015-10-17 ENCOUNTER — Other Ambulatory Visit: Payer: Self-pay | Admitting: Obstetrics and Gynecology

## 2015-10-17 DIAGNOSIS — Z853 Personal history of malignant neoplasm of breast: Secondary | ICD-10-CM

## 2015-10-17 DIAGNOSIS — N6310 Unspecified lump in the right breast, unspecified quadrant: Secondary | ICD-10-CM

## 2015-10-19 ENCOUNTER — Ambulatory Visit
Admission: RE | Admit: 2015-10-19 | Discharge: 2015-10-19 | Disposition: A | Discharge status: Home / Self Care | Payer: Managed Care, Other (non HMO) | Source: Ambulatory Visit | Attending: Obstetrics and Gynecology | Admitting: Obstetrics and Gynecology

## 2015-10-19 ENCOUNTER — Other Ambulatory Visit: Payer: Self-pay | Admitting: Obstetrics and Gynecology

## 2015-10-19 DIAGNOSIS — N6310 Unspecified lump in the right breast, unspecified quadrant: Secondary | ICD-10-CM

## 2015-10-23 ENCOUNTER — Encounter: Payer: Self-pay | Admitting: Hematology and Oncology

## 2015-10-23 ENCOUNTER — Ambulatory Visit (HOSPITAL_BASED_OUTPATIENT_CLINIC_OR_DEPARTMENT_OTHER): Payer: Managed Care, Other (non HMO)

## 2015-10-23 ENCOUNTER — Encounter: Payer: Self-pay | Admitting: *Deleted

## 2015-10-23 ENCOUNTER — Ambulatory Visit (HOSPITAL_BASED_OUTPATIENT_CLINIC_OR_DEPARTMENT_OTHER): Payer: Managed Care, Other (non HMO) | Admitting: Hematology and Oncology

## 2015-10-23 DIAGNOSIS — D6481 Anemia due to antineoplastic chemotherapy: Secondary | ICD-10-CM

## 2015-10-23 DIAGNOSIS — Z79811 Long term (current) use of aromatase inhibitors: Secondary | ICD-10-CM

## 2015-10-23 DIAGNOSIS — R5383 Other fatigue: Secondary | ICD-10-CM | POA: Diagnosis not present

## 2015-10-23 DIAGNOSIS — G3184 Mild cognitive impairment, so stated: Secondary | ICD-10-CM

## 2015-10-23 DIAGNOSIS — C50412 Malignant neoplasm of upper-outer quadrant of left female breast: Secondary | ICD-10-CM

## 2015-10-23 DIAGNOSIS — G620 Drug-induced polyneuropathy: Secondary | ICD-10-CM

## 2015-10-23 DIAGNOSIS — R252 Cramp and spasm: Secondary | ICD-10-CM

## 2015-10-23 DIAGNOSIS — N63 Unspecified lump in unspecified breast: Secondary | ICD-10-CM | POA: Insufficient documentation

## 2015-10-23 DIAGNOSIS — Z170 Estrogen receptor positive status [ER+]: Secondary | ICD-10-CM

## 2015-10-23 DIAGNOSIS — N951 Menopausal and female climacteric states: Secondary | ICD-10-CM | POA: Diagnosis not present

## 2015-10-23 LAB — CBC WITH DIFFERENTIAL/PLATELET
BASO%: 0.8 % (ref 0.0–2.0)
Basophils Absolute: 0 10*3/uL (ref 0.0–0.1)
EOS ABS: 0.1 10*3/uL (ref 0.0–0.5)
EOS%: 1.9 % (ref 0.0–7.0)
HCT: 33.8 % — ABNORMAL LOW (ref 34.8–46.6)
HEMOGLOBIN: 11.2 g/dL — AB (ref 11.6–15.9)
LYMPH%: 37.7 % (ref 14.0–49.7)
MCH: 27.5 pg (ref 25.1–34.0)
MCHC: 33.1 g/dL (ref 31.5–36.0)
MCV: 83.1 fL (ref 79.5–101.0)
MONO#: 0.2 10*3/uL (ref 0.1–0.9)
MONO%: 6.8 % (ref 0.0–14.0)
NEUT%: 52.8 % (ref 38.4–76.8)
NEUTROS ABS: 1.8 10*3/uL (ref 1.5–6.5)
Platelets: 340 10*3/uL (ref 145–400)
RBC: 4.06 10*6/uL (ref 3.70–5.45)
RDW: 14.2 % (ref 11.2–14.5)
WBC: 3.3 10*3/uL — AB (ref 3.9–10.3)
lymph#: 1.3 10*3/uL (ref 0.9–3.3)

## 2015-10-23 LAB — COMPREHENSIVE METABOLIC PANEL
ALBUMIN: 3.5 g/dL (ref 3.5–5.0)
ALK PHOS: 74 U/L (ref 40–150)
ALT: 17 U/L (ref 0–55)
AST: 19 U/L (ref 5–34)
Anion Gap: 9 mEq/L (ref 3–11)
BUN: 15.7 mg/dL (ref 7.0–26.0)
CO2: 26 meq/L (ref 22–29)
Calcium: 9.3 mg/dL (ref 8.4–10.4)
Chloride: 107 mEq/L (ref 98–109)
Creatinine: 0.9 mg/dL (ref 0.6–1.1)
EGFR: 84 mL/min/{1.73_m2} — ABNORMAL LOW (ref >90)
GLUCOSE: 132 mg/dL (ref 70–140)
Potassium: 4 mEq/L (ref 3.5–5.1)
SODIUM: 141 meq/L (ref 136–145)
TOTAL PROTEIN: 7.2 g/dL (ref 6.4–8.3)

## 2015-10-23 NOTE — Progress Notes (Signed)
Patient Care Team: Leighton Ruff, MD as PCP - General (Family Medicine) [name], MD as Consulting Physician (General Surgery) Thea Silversmith, MD as Consulting Physician (Radiation Oncology) Gavin Pound, MD as Consulting Physician (Family Medicine) Nicholas Lose, MD as Consulting Physician (Hematology and Oncology)  DIAGNOSIS: Breast cancer of upper-outer quadrant of left female breast [facility])   Staging form: Breast, AJCC 7th Edition   - Clinical: Stage IIA (T2, N0, cM0) - Signed by Rulon Eisenmenger, MD on 09/20/2013         Staging comments: Staged at breast conference 08/17/13.    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  The cancer was grade 2 and ER positive at 100%, PR+ at 91% and HER2- with a Ki67 of 47%      08/12/2013 Breast MRI    2.1 x 1.2 x 1.2 cm biopsy-proven invasive ductal carcinoma and ductal carcinoma in situ in the 9 o'clock position of the left breast      08/30/2013 Surgery    Left Lumpectomy IDC with infiltrating lobular features. Pos for LVSI; 1 mm from nearest margin, DCIS 1 SLN Neg; ONCOTYPE Dx Rec score 17, 10 yr risk of recurrance is 11% with Tamoxifen alone      10/04/2013 - 12/27/2013 Chemotherapy    Weekly Taxol/Herceptin x 12; Herceptin maintenance q. 3 weeks until end of August 2016      01/17/2014 - 03/09/2014 Radiation Therapy    Adjuvant radiation therapy      03/16/2014 -  Anti-estrogen oral therapy    anastrozole 1 mg daily to start 03/30/2014 plan is for 5 years       CHIEF COMPLIANT: Follow-up on anastrozole therapy  INTERVAL HISTORY: Jean [name] is a  55 year old with above-mentioned history of left breast cancer treated with the surgery followed by adjuvant chemotherapy and radiation is currently on anastrozole. She is tolerating anastrozole extremely well. She does have hot flashes muscle aches and pains. The hot flashes have improved by increasing the dosage of Effexor. Recent right breast biopsy causing tenderness  REVIEW OF SYSTEMS:   Constitutional: Denies fevers, chills or abnormal weight loss Eyes: Denies blurriness of vision Ears, nose, mouth, throat, and face: Denies mucositis or sore throat Respiratory: Denies cough, dyspnea or wheezes Cardiovascular: Denies palpitation, chest discomfort Gastrointestinal:  Denies nausea, heartburn or change in bowel habits Skin: Denies abnormal skin rashes Lymphatics: Denies new lymphadenopathy or easy bruising Neurological:Denies numbness, tingling or new weaknesses Behavioral/Psych: Mood is stable, no new changes  Extremities: No lower extremity edema Breast: Recent biopsy causing tenderness in the right breast All other systems were reviewed with the patient and are negative.  I have reviewed the past medical history, past surgical history, social history and family history with the patient and they are unchanged from previous note.  ALLERGIES:  is allergic to hydrocodone.  MEDICATIONS:  Current Outpatient Prescriptions  Medication Sig Dispense Refill  . anastrozole (ARIMIDEX) 1 MG tablet Take 1 tablet (1 mg total) by mouth daily. 90 tablet 3  . cholecalciferol (VITAMIN D) 1000 UNITS tablet Take 1,000 Units by mouth daily.    Marland Kitchen  Cyanocobalamin (VITAMIN B 12 PO) Take 1 tablet by mouth daily.    Marland Kitchen gabapentin (NEURONTIN) 300 MG capsule TAKE 1 CAPSULE (300 MG TOTAL) BY MOUTH AT BEDTIME. 90 capsule 2  . lidocaine-prilocaine (EMLA) cream Apply 1 application topically as needed. 30 g 3  . lisinopril-hydrochlorothiazide (PRINZIDE,ZESTORETIC) 10-12.5 MG per tablet Take 1 tablet by mouth daily.  0    . Multiple Vitamin (MULTIVITAMIN) tablet Take 1 tablet by mouth daily.    Marland Kitchen omeprazole (PRILOSEC) 40 MG capsule Take 1 capsule (40 mg total) by mouth daily. 30 capsule 3  . ondansetron (ZOFRAN) 8 MG tablet Take 1 tablet (8 mg total) by mouth every 8 (eight) hours as needed for nausea or vomiting. 30 tablet 0  . venlafaxine XR (EFFEXOR-XR) 37.5 MG 24 hr capsule TAKE ONE CAPSULE BY MOUTH EVERY DAY WITH BREAKFAST 90 capsule 3  . zolpidem (AMBIEN) 5 MG tablet Take 5 mg by mouth at bedtime as needed for sleep.     No current facility-administered medications for this visit.     PHYSICAL EXAMINATION: ECOG PERFORMANCE STATUS: 1 - Symptomatic but completely ambulatory  Vitals:   10/23/15 1110  BP: 108/83  Pulse: 65  Resp: 18  Temp: 98.6 F (37 C)   Filed Weights   10/23/15 1110  Weight: 183 lb 9.6 oz (83.3 kg)    GENERAL:alert, no distress and comfortable SKIN: skin color, texture, turgor are normal, no rashes or significant lesions EYES: normal, Conjunctiva are pink and non-injected, sclera clear OROPHARYNX:no exudate, no erythema and lips, buccal mucosa, and tongue normal  NECK: supple, thyroid normal size, non-tender, without nodularity LYMPH:  no palpable lymphadenopathy in the cervical, axillary or inguinal LUNGS: clear to auscultation and percussion with normal breathing effort HEART: regular rate & rhythm and no murmurs and no lower extremity edema ABDOMEN:abdomen soft, non-tender and normal bowel sounds MUSCULOSKELETAL:no cyanosis of digits and no clubbing  NEURO: alert & oriented x 3 with fluent speech, no focal motor/sensory deficits EXTREMITIES: No lower extremity edema BREAST: No palpable masses or nodules in either right or left breasts. No palpable axillary supraclavicular or infraclavicular adenopathy no breast tenderness or nipple discharge. (exam performed in the presence of a chaperone)  LABORATORY DATA:  I have reviewed the data as listed   Chemistry       Component Value Date/Time   NA 141 10/23/2015 1059   K 4.0 10/23/2015 1059   CL 101 01/10/2014 1639   CO2 26 10/23/2015 1059   BUN 15.7 10/23/2015 1059   CREATININE 0.9 10/23/2015 1059      Component Value Date/Time   CALCIUM 9.3 10/23/2015 1059   ALKPHOS 74 10/23/2015 1059   AST 19 10/23/2015 1059   ALT 17 10/23/2015 1059   BILITOT <0.30 10/23/2015 1059       Lab Results  Component Value Date   WBC 3.3 (L) 10/23/2015   HGB 11.2 (L) 10/23/2015   HCT 33.8 (L) 10/23/2015   MCV 83.1 10/23/2015   PLT 340 10/23/2015   NEUTROABS 1.8 10/23/2015     ASSESSMENT & PLAN:  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 Lucia Gaskins, received Taxol Herceptin on 10/04/2013 weekly x12, now on every 3 week Herceptin maintenance, completed adjuvant radiation, started tamoxifen 20 mg daily 03/16/2014, Herceptin maintenance completed August  24th 2016  Current treatment: Anastrozole 1 mg daily started 03/30/2014 plan is for 5 years.  Anastrozole toxicities:  1. Hot flashes: Currently on Effexor, improved 2. fatigue: Partly related to antiestrogen therapy and partly related to anemia as well as some deconditioning.  3. 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 She is interested in the trial. 4. Muscle cramps: I encouraged her to take turmeric  Anemia due to chemotherapy: Hemoglobin is 11.2 g today.  Neuropathy grade 1: Takes Neurontin Return to clinic in 6 months.   No orders of the defined types were placed in this encounter.  The patient has a good understanding of the overall plan. she agrees with it. she  will call with any problems that may develop before the next visit here.   Rulon Eisenmenger, MD 10/23/15

## 2015-10-23 NOTE — Assessment & Plan Note (Signed)
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 Lucia Gaskins, received Taxol Herceptin on 10/04/2013 weekly x12, now on every 3 week Herceptin maintenance, completed adjuvant radiation, started tamoxifen 20 mg daily 03/16/2014, Herceptin maintenance completed August 24th 2016  Current treatment: Anastrozole 1 mg daily started 03/30/2014 plan is for 5 years.  Anastrozole toxicities:  1. Hot flashes: Currently on Effexor, improved 2. fatigue: Partly related to antiestrogen therapy and partly related to anemia as well as some deconditioning.  3. 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 She is interested in the trial. 4. Muscle cramps: I encouraged her to take turmeric    Anemia due to chemotherapy: Hemoglobin is 10.8 g today.  Neuropathy grade 1: Takes Neurontin Return to clinic in 6 months.

## 2016-01-26 ENCOUNTER — Other Ambulatory Visit: Payer: Self-pay | Admitting: Nurse Practitioner

## 2016-04-13 ENCOUNTER — Telehealth: Payer: Self-pay

## 2016-04-13 NOTE — Telephone Encounter (Signed)
Called and left a message with new appt due to 3/22 cme  Starlyn Droge

## 2016-04-21 ENCOUNTER — Ambulatory Visit: Payer: Managed Care, Other (non HMO) | Admitting: Hematology and Oncology

## 2016-04-24 ENCOUNTER — Ambulatory Visit: Payer: Managed Care, Other (non HMO) | Admitting: Hematology and Oncology

## 2016-04-28 ENCOUNTER — Ambulatory Visit: Payer: Managed Care, Other (non HMO) | Admitting: Hematology and Oncology

## 2016-04-28 NOTE — Assessment & Plan Note (Deleted)
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 Lucia Gaskins, received Taxol Herceptin on 10/04/2013 weekly x12, now on every 3 week Herceptin maintenance, completed adjuvant radiation, started tamoxifen 20 mg daily 03/16/2014, Herceptin maintenance completed August 24th 2016  Current treatment: Anastrozole 1 mg daily started 03/30/2014 plan is for 5 years.  Anastrozole toxicities:  1. Hot flashes: Currently on Effexor, improved 2. fatigue: Partly related to antiestrogen therapy and partly related to anemia as well as some deconditioning.  3. 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 She is interested in the trial. 4. Muscle cramps: I encouraged her to take turmeric  Anemia due to chemotherapy: Hemoglobin is 11.2 g today.  Neuropathy grade 1: Takes Neurontin Return to clinic in 1 year for follow-up.

## 2016-05-12 ENCOUNTER — Emergency Department (HOSPITAL_COMMUNITY)
Admission: EM | Admit: 2016-05-12 | Discharge: 2016-05-12 | Disposition: A | Discharge status: Home / Self Care | Payer: Worker's Compensation | Attending: Emergency Medicine | Admitting: Emergency Medicine

## 2016-05-12 ENCOUNTER — Encounter (HOSPITAL_COMMUNITY): Payer: Self-pay | Admitting: Emergency Medicine

## 2016-05-12 ENCOUNTER — Emergency Department (HOSPITAL_COMMUNITY): Payer: Worker's Compensation

## 2016-05-12 DIAGNOSIS — W010XXA Fall on same level from slipping, tripping and stumbling without subsequent striking against object, initial encounter: Secondary | ICD-10-CM | POA: Diagnosis not present

## 2016-05-12 DIAGNOSIS — Y9289 Other specified places as the place of occurrence of the external cause: Secondary | ICD-10-CM | POA: Insufficient documentation

## 2016-05-12 DIAGNOSIS — Z87891 Personal history of nicotine dependence: Secondary | ICD-10-CM | POA: Diagnosis not present

## 2016-05-12 DIAGNOSIS — W19XXXA Unspecified fall, initial encounter: Secondary | ICD-10-CM

## 2016-05-12 DIAGNOSIS — Y999 Unspecified external cause status: Secondary | ICD-10-CM | POA: Insufficient documentation

## 2016-05-12 DIAGNOSIS — M25512 Pain in left shoulder: Secondary | ICD-10-CM

## 2016-05-12 DIAGNOSIS — Z853 Personal history of malignant neoplasm of breast: Secondary | ICD-10-CM | POA: Diagnosis not present

## 2016-05-12 DIAGNOSIS — Y939 Activity, unspecified: Secondary | ICD-10-CM | POA: Diagnosis not present

## 2016-05-12 DIAGNOSIS — Z79899 Other long term (current) drug therapy: Secondary | ICD-10-CM | POA: Insufficient documentation

## 2016-05-12 MED ORDER — IBUPROFEN 200 MG PO TABS
600.0000 mg | ORAL_TABLET | Freq: Once | ORAL | Status: AC
  Administered 2016-05-12: 600 mg via ORAL
  Filled 2016-05-12: qty 3

## 2016-05-12 MED ORDER — NAPROXEN 375 MG PO TABS: 375.0000 mg | ORAL_TABLET | Freq: Two times a day (BID) | ORAL | 0 refills | Status: DC

## 2016-05-12 MED ORDER — METHOCARBAMOL 500 MG PO TABS: 500.0000 mg | ORAL_TABLET | Freq: Two times a day (BID) | ORAL | 0 refills | Status: DC

## 2016-05-12 NOTE — ED Triage Notes (Signed)
Per EMS, patient from work c/o left shoulder pain after missing a step and falling approx 1 foot. Denies head injury and LOC. No deformity noted. Ambulatory with EMS. A&Ox4.

## 2016-05-12 NOTE — Discharge Instructions (Signed)
X-ray shows no fracture. This is likely a musculoskeletal pain. Wear the sling for comfort. Alternate ice and heat. May take the naproxen for pain. This is a anti-inflammatory. Do not take any additional ibuprofen/Motrin/Aleve/Advil with this medication. He may take Tylenol. Take the Robaxin for muscle relaxation at night. Follow-up with her primary care doctor. UA also follow up with a orthopedist for repeat imaging if pain does not improve.

## 2016-05-12 NOTE — ED Provider Notes (Signed)
[facility] DEPT Provider Note   CSN: [id number] Arrival date & time: 05/12/16  1006   By signing my name below, I, Eunice Blase, attest that this documentation has been prepared under the direction and in the presence of Doristine Devoid, PA-C. Electronically Signed: Eunice Blase, Scribe. 05/12/16. 10:49 AM.   History   Chief Complaint Chief Complaint  Patient presents with  . Fall  . Shoulder Injury   The history is provided by the patient and medical records. No language interpreter was used.    HPI Comments: Jean [name] is a 56 y.o. female with Hx of arthritis BIB EMS who presents to the Emergency Department complaining of L shoulder pain s/p a witnessed mechanical fall that occurred PTA. Pt describes 6/10, constant, unchanged, aching L shoulder pain pain that is improved with rest and worsened with contact and movement. Pt notes associated decreased ROM in the shoulder d/t pain. She states she tripped over a box and fell from the height of ~1 foot while stepping down from a platform. No head trauma or LOC noted, she states she was slightly jarred following the fall. Pt denies neck pain, numbness, tingling and Hx of falls.  Past Medical History:  Diagnosis Date  . Arthritis    back  . Breast cancer ([place]) 08/2013   left  . Dental crown present   . GERD (gastroesophageal reflux disease)    no current med.  . Radiation 01/18/14-03/09/14   Left breast    Patient Active Problem List   Diagnosis Date Noted  . Dry skin dermatitis 06/15/2014  . Rash 05/18/2014  . Anemia 01/10/2014  . Symptomatic anemia 01/10/2014  . Chest pain of uncertain etiology 62/95/2841  . Breast cancer of upper-outer quadrant of left female breast ([place]) 08/12/2013    Past Surgical History:  Procedure Laterality Date  . ABDOMINAL HYSTERECTOMY  12/16/2007  . FOOT SURGERY Right   . Lumpectory Left  August 30, 2013  . PORT-A-CATH REMOVAL Left 01/19/2015   Procedure: MINOR REMOVAL PORT-A-CATH;   Surgeon: [name], MD;  Location: [facility];  Service: General;  Laterality: Left;  . PORTACATH PLACEMENT Right 09/26/2013   Procedure: INSERTION PORT-A-CATH;  Surgeon: Shann Medal, MD;  Location: [facility];  Service: General;  Laterality: Right;    OB History    No data available       Home Medications    Prior to Admission medications   Medication Sig Start Date End Date Taking? Authorizing Provider  anastrozole (ARIMIDEX) 1 MG tablet Take 1 tablet (1 mg total) by mouth daily. 04/02/15   Nicholas Lose, MD  cholecalciferol (VITAMIN D) 1000 UNITS tablet Take 1,000 Units by mouth daily.    Historical Provider, MD  Cyanocobalamin (VITAMIN B 12 PO) Take 1 tablet by mouth daily.    Historical Provider, MD  gabapentin (NEURONTIN) 300 MG capsule TAKE 1 CAPSULE (300 MG TOTAL) BY MOUTH AT BEDTIME. 10/16/15   Nicholas Lose, MD  lidocaine-prilocaine (EMLA) cream Apply 1 application topically as needed. 09/30/13   Nicholas Lose, MD  lisinopril-hydrochlorothiazide (PRINZIDE,ZESTORETIC) 10-12.5 MG per tablet Take 1 tablet by mouth daily. 11/22/13   Historical Provider, MD  Multiple Vitamin (MULTIVITAMIN) tablet Take 1 tablet by mouth daily.    Historical Provider, MD  omeprazole (PRILOSEC) 40 MG capsule Take 1 capsule (40 mg total) by mouth daily. 11/01/13   Gardenia Phlegm, NP  ondansetron (ZOFRAN) 8 MG tablet Take 1 tablet (8 mg total) by mouth every 8 (eight) hours  as needed for nausea or vomiting. 07/26/14   Nicholas Lose, MD  venlafaxine XR (EFFEXOR-XR) 37.5 MG 24 hr capsule TAKE ONE CAPSULE BY MOUTH EVERY DAY WITH BREAKFAST 03/12/15   Nicholas Lose, MD  zolpidem (AMBIEN) 5 MG tablet Take 5 mg by mouth at bedtime as needed for sleep.    Historical Provider, MD    Family History Family History  Problem Relation Age of Onset  . Breast cancer Paternal Aunt   . Cervical cancer Paternal Aunt   . Colon cancer Paternal Uncle   . Prostate cancer Paternal Uncle     Social  History Social History  Substance Use Topics  . Smoking status: Former Smoker    Packs/day: 0.25    Years: 2.50    Types: Cigarettes    Quit date: 02/03/2011  . Smokeless tobacco: Never Used  . Alcohol use No     Comment: occasionally     Allergies   Hydrocodone   Review of Systems Review of Systems  HENT: Negative for facial swelling.   Musculoskeletal: Positive for arthralgias. Negative for gait problem, neck pain and neck stiffness.  Neurological: Negative for dizziness, syncope, weakness, light-headedness, numbness and headaches.     Physical Exam Updated Vital Signs BP (!) 132/95 (BP Location: Right Arm)   Pulse 66   Temp 97.8 F (36.6 C) (Oral)   Resp 16   LMP 11/03/2008   SpO2 99%   Physical Exam  Constitutional: She is oriented to person, place, and time. She appears well-developed and well-nourished.  HENT:  Head: Normocephalic and atraumatic.  No bilateral hemotympanum. No septal hematoma.  Eyes: Conjunctivae and EOM are normal. Pupils are equal, round, and reactive to light. Right eye exhibits no discharge. Left eye exhibits no discharge. No scleral icterus.  Neck: Normal range of motion. Neck supple. No JVD present. No tracheal deviation present.  No midline tenderness. No deformities or step-offs noted.  Pulmonary/Chest: Effort normal. No stridor.  Musculoskeletal:       Left shoulder: She exhibits decreased range of motion, tenderness, bony tenderness and pain. She exhibits no swelling, no effusion, no crepitus, no deformity, no laceration, no spasm, normal pulse and normal strength.  Radial pulses 2+ bilaterally. Grip strength normal. No obvious deformity noted. No pain with flexion and extension of the left elbow or left wrist.  No midline T-spine or L-spine tenderness.  Neurological: She is alert and oriented to person, place, and time. Coordination normal.  The patient is alert, attentive, and oriented x 3. Speech is clear. Cranial nerve II-VII  grossly intact. Negative pronator drift. Sensation intact. Strength 5/5 in all extremities. Reflexes 2+ and symmetric at biceps, triceps, knees, and ankles. Rapid alternating movement and fine finger movements intact. Romberg is absent. Posture and gait normal.   Skin: Skin is warm and dry. Capillary refill takes less than 2 seconds.  Psychiatric: She has a normal mood and affect. Her behavior is normal. Judgment and thought content normal.  Nursing note and vitals reviewed.    ED Treatments / Results  DIAGNOSTIC STUDIES: Oxygen Saturation is 99% on RA, normal by my interpretation.    COORDINATION OF CARE: 10:48 AM Discussed treatment plan with pt at bedside and pt agreed to plan. Will order medication and imaging. Pt advised of symptomatic care and return precautions.  Labs (all labs ordered are listed, but only abnormal results are displayed) Labs Reviewed - No data to display  EKG  EKG Interpretation None       Radiology  Dg Shoulder Left  Result Date: 05/12/2016 CLINICAL DATA:  Shoulder pain with limited range of motion since a fall this morning. EXAM: LEFT SHOULDER - 2+ VIEW COMPARISON:  None. FINDINGS: There is no evidence of fracture or dislocation. There is no evidence of arthropathy or other focal bone abnormality. Soft tissues are unremarkable. IMPRESSION: Negative. Electronically Signed   By: Lorriane Shire M.D.   On: 05/12/2016 11:12    Procedures Procedures (including critical care time)  Medications Ordered in ED Medications  ibuprofen (ADVIL,MOTRIN) tablet 600 mg (not administered)     Initial Impression / Assessment and Plan / ED Course  I have reviewed the triage vital signs and the nursing notes.  Pertinent labs & imaging results that were available during my care of the patient were reviewed by me and considered in my medical decision making (see chart for details).     The patient presents to the ED with mechanical fall with left shoulder pain. No  obvious deformity noted. Neurovascularly intact. Strength is normal. Patient denies hitting head or LOC. No neuro deficits. Patient X-Ray negative for obvious fracture or dislocation. Pain managed in ED. Pt advised to follow up with orthopedics if symptoms persist for possibility of missed fracture diagnosis. Patient given brace while in ED, conservative therapy recommended and discussed. Patient will be dc home & is agreeable with above plan. Discussed strict return precautions.   Final Clinical Impressions(s) / ED Diagnoses   Final diagnoses:  Fall, initial encounter  Acute pain of left shoulder    New Prescriptions New Prescriptions   METHOCARBAMOL (ROBAXIN) 500 MG TABLET    Take 1 tablet (500 mg total) by mouth 2 (two) times daily.   NAPROXEN (NAPROSYN) 375 MG TABLET    Take 1 tablet (375 mg total) by mouth 2 (two) times daily.  I personally performed the services described in this documentation, which was scribed in my presence. The recorded information has been reviewed and is accurate.    Doristine Devoid, PA-C 05/12/16 [place], MD 05/12/16 1726

## 2016-08-31 ENCOUNTER — Other Ambulatory Visit: Payer: Self-pay | Admitting: Hematology and Oncology

## 2016-08-31 DIAGNOSIS — C50412 Malignant neoplasm of upper-outer quadrant of left female breast: Secondary | ICD-10-CM

## 2020-08-18 DIAGNOSIS — G629 Polyneuropathy, unspecified: Secondary | ICD-10-CM | POA: Insufficient documentation

## 2021-03-25 DIAGNOSIS — S52122A Displaced fracture of head of left radius, initial encounter for closed fracture: Secondary | ICD-10-CM | POA: Insufficient documentation

## 2021-03-29 ENCOUNTER — Ambulatory Visit: Payer: Self-pay | Admitting: Internal Medicine

## 2021-04-03 ENCOUNTER — Encounter: Payer: Self-pay | Admitting: Internal Medicine

## 2021-04-03 ENCOUNTER — Ambulatory Visit: Payer: 59 | Attending: Internal Medicine | Admitting: Internal Medicine

## 2021-04-03 ENCOUNTER — Other Ambulatory Visit: Payer: Self-pay | Admitting: Internal Medicine

## 2021-04-03 VITALS — BP 170/113 | HR 83 | Resp 16 | Ht 66.0 in | Wt 191.2 lb

## 2021-04-03 DIAGNOSIS — F411 Generalized anxiety disorder: Secondary | ICD-10-CM | POA: Diagnosis not present

## 2021-04-03 DIAGNOSIS — Z1211 Encounter for screening for malignant neoplasm of colon: Secondary | ICD-10-CM

## 2021-04-03 DIAGNOSIS — E66811 Obesity, class 1: Secondary | ICD-10-CM

## 2021-04-03 DIAGNOSIS — Z6830 Body mass index (BMI) 30.0-30.9, adult: Secondary | ICD-10-CM

## 2021-04-03 DIAGNOSIS — F320 Major depressive disorder, single episode, mild: Secondary | ICD-10-CM

## 2021-04-03 DIAGNOSIS — I10 Essential (primary) hypertension: Secondary | ICD-10-CM | POA: Diagnosis not present

## 2021-04-03 DIAGNOSIS — Z7689 Persons encountering health services in other specified circumstances: Secondary | ICD-10-CM

## 2021-04-03 DIAGNOSIS — E669 Obesity, unspecified: Secondary | ICD-10-CM | POA: Diagnosis not present

## 2021-04-03 DIAGNOSIS — Z23 Encounter for immunization: Secondary | ICD-10-CM

## 2021-04-03 DIAGNOSIS — Z853 Personal history of malignant neoplasm of breast: Secondary | ICD-10-CM

## 2021-04-03 DIAGNOSIS — Z1231 Encounter for screening mammogram for malignant neoplasm of breast: Secondary | ICD-10-CM

## 2021-04-03 MED ORDER — LISINOPRIL-HYDROCHLOROTHIAZIDE 10-12.5 MG PO TABS: 1.0000 | ORAL_TABLET | Freq: Every day | ORAL | 6 refills | Status: DC

## 2021-04-03 MED ORDER — VENLAFAXINE HCL ER 37.5 MG PO CP24: 37.5000 mg | ORAL_CAPSULE | Freq: Every day | ORAL | 3 refills | Status: DC

## 2021-04-03 NOTE — Patient Instructions (Signed)
Healthy Eating ?Following a healthy eating pattern may help you to achieve and maintain a healthy body weight, reduce the risk of chronic disease, and live a long and productive life. It is important to follow a healthy eating pattern at an appropriate calorie level for your body. Your nutritional needs should be met primarily through food by choosing a variety of nutrient-rich foods. ?What are tips for following this plan? ?Reading food labels ?Read labels and choose the following: ?Reduced or low sodium. ?Juices with 100% fruit juice. ?Foods with low saturated fats and high polyunsaturated and monounsaturated fats. ?Foods with whole grains, such as whole wheat, cracked wheat, brown rice, and wild rice. ?Whole grains that are fortified with folic acid. This is recommended for women who are pregnant or who want to become pregnant. ?Read labels and avoid the following: ?Foods with a lot of added sugars. These include foods that contain brown sugar, corn sweetener, corn syrup, dextrose, fructose, glucose, high-fructose corn syrup, honey, invert sugar, lactose, malt syrup, maltose, molasses, raw sugar, sucrose, trehalose, or turbinado sugar. ?Do not eat more than the following amounts of added sugar per day: ?6 teaspoons (25 g) for women. ?9 teaspoons (38 g) for men. ?Foods that contain processed or refined starches and grains. ?Refined grain products, such as white flour, degermed cornmeal, white bread, and white rice. ?Shopping ?Choose nutrient-rich snacks, such as vegetables, whole fruits, and nuts. Avoid high-calorie and high-sugar snacks, such as potato chips, fruit snacks, and candy. ?Use oil-based dressings and spreads on foods instead of solid fats such as butter, stick margarine, or cream cheese. ?Limit pre-made sauces, mixes, and "instant" products such as flavored rice, instant noodles, and ready-made pasta. ?Try more plant-protein sources, such as tofu, tempeh, black beans, edamame, lentils, nuts, and  seeds. ?Explore eating plans such as the Mediterranean diet or vegetarian diet. ?Cooking ?Use oil to saut? or stir-fry foods instead of solid fats such as butter, stick margarine, or lard. ?Try baking, boiling, grilling, or broiling instead of frying. ?Remove the fatty part of meats before cooking. ?Steam vegetables in water or broth. ?Meal planning ? ?At meals, imagine dividing your plate into fourths: ?One-half of your plate is fruits and vegetables. ?One-fourth of your plate is whole grains. ?One-fourth of your plate is protein, especially lean meats, poultry, eggs, tofu, beans, or nuts. ?Include low-fat dairy as part of your daily diet. ?Lifestyle ?Choose healthy options in all settings, including home, work, school, restaurants, or stores. ?Prepare your food safely: ?Wash your hands after handling raw meats. ?Keep food preparation surfaces clean by regularly washing with hot, soapy water. ?Keep raw meats separate from ready-to-eat foods, such as fruits and vegetables. ?Cook seafood, meat, poultry, and eggs to the recommended internal temperature. ?Store foods at safe temperatures. In general: ?Keep cold foods at 40?F (4.4?C) or below. ?Keep hot foods at 140?F (60?C) or above. ?Keep your freezer at 0?F (-17.8?C) or below. ?Foods are no longer safe to eat when they have been between the temperatures of 40?-140?F (4.4-60?C) for more than 2 hours. ?What foods should I eat? ?Fruits ?Aim to eat 2 cup-equivalents of fresh, canned (in natural juice), or frozen fruits each day. Examples of 1 cup-equivalent of fruit include 1 small apple, 8 large strawberries, 1 cup canned fruit, ? cup dried fruit, or 1 cup 100% juice. ?Vegetables ?Aim to eat 2?-3 cup-equivalents of fresh and frozen vegetables each day, including different varieties and colors. Examples of 1 cup-equivalent of vegetables include 2 medium carrots, 2 cups raw,  leafy greens, 1 cup chopped vegetable (raw or cooked), or 1 medium baked potato. ?Grains ?Aim to  eat 6 ounce-equivalents of whole grains each day. Examples of 1 ounce-equivalent of grains include 1 slice of bread, 1 cup ready-to-eat cereal, 3 cups popcorn, or ? cup cooked rice, pasta, or cereal. ?Meats and other proteins ?Aim to eat 5-6 ounce-equivalents of protein each day. Examples of 1 ounce-equivalent of protein include 1 egg, 1/2 cup nuts or seeds, or 1 tablespoon (16 g) peanut butter. A cut of meat or fish that is the size of a deck of cards is about 3-4 ounce-equivalents. ?Of the protein you eat each week, try to have at least 8 ounces come from seafood. This includes salmon, trout, herring, and anchovies. ?Dairy ?Aim to eat 3 cup-equivalents of fat-free or low-fat dairy each day. Examples of 1 cup-equivalent of dairy include 1 cup (240 mL) milk, 8 ounces (250 g) yogurt, 1? ounces (44 g) natural cheese, or 1 cup (240 mL) fortified soy milk. ?Fats and oils ?Aim for about 5 teaspoons (21 g) per day. Choose monounsaturated fats, such as canola and olive oils, avocados, peanut butter, and most nuts, or polyunsaturated fats, such as sunflower, corn, and soybean oils, walnuts, pine nuts, sesame seeds, sunflower seeds, and flaxseed. ?Beverages ?Aim for six 8-oz glasses of water per day. Limit coffee to three to five 8-oz cups per day. ?Limit caffeinated beverages that have added calories, such as soda and energy drinks. ?Limit alcohol intake to no more than 1 drink a day for nonpregnant women and 2 drinks a day for men. One drink equals 12 oz of beer (355 mL), 5 oz of wine (148 mL), or 1? oz of hard liquor (44 mL). ?Seasoning and other foods ?Avoid adding excess amounts of salt to your foods. Try flavoring foods with herbs and spices instead of salt. ?Avoid adding sugar to foods. ?Try using oil-based dressings, sauces, and spreads instead of solid fats. ?This information is based on general [place] nutrition guidelines. For more information, visit [employer]. Exact amounts may vary based on your nutrition  needs. ?Summary ?A healthy eating plan may help you to maintain a healthy weight, reduce the risk of chronic diseases, and stay active throughout your life. ?Plan your meals. Make sure you eat the right portions of a variety of nutrient-rich foods. ?Try baking, boiling, grilling, or broiling instead of frying. ?Choose healthy options in all settings, including home, work, school, restaurants, or stores. ?This information is not intended to replace advice given to you by your health care provider. Make sure you discuss any questions you have with your health care provider. ?Document Revised: 09/18/2020 Document Reviewed: 09/18/2020 ?Elsevier Patient Education ? [place]. ? ?

## 2021-04-03 NOTE — Progress Notes (Addendum)
Patient ID: Jean [name], female    DOB: Jun 13, 1960  MRN: [id number]  CC: New Patient (Initial Visit)   Subjective: Jean [name] is a 61 y.o. female who presents for new pt visit Her concerns today include:  Pt with hx of LT breast CA s/p lumpectomy, chemo and XRT (completed 5 yrs of Arimedex), former smoker (quit 2012),  HTN, GAD  Previous PCP was at [facility] with [place].  Last seen 2 yrs ago.  Pt states she was fired from the practice for reasons of which she is clear.  HTN:  she was on Lisinopril/HCTZ.  Off med x 2 yrs.   Checked BP off and on x 1 mth after being seen at [facility] in July of last yr.  Readings were high.   Had some recent readings 170/110 after recent injury to LT arm Endorses headaches some mornings but she thinks it is due to her not drinking enough water No Dizziness, CP/SOB, LE edema Does not use a lot of salt in foods.    Anxiety:   She was on Ambien, Xanax, Effexor. Weaned herself off 2 yrs ago.  Was feeling better at the time but now she has a lot of stresses and thinks she needs to get back on medication Never saw a counselor in the past Reports several stressors that have her feeling overwhelm.  Trying to take care of her elderly mom. She is an only child.  She is having to raise her 53 yr old grandson for past 1.5 yrs. 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 Challenging having to start new job at her age.  Was at another job for 20+ yrs that went over seas.  Has 3 dogs but they do bring her job. Endorses depression with "some good days and some bad days." No SI/HI  Fell at work last Monday and fx bone in LT elbow.  Arm in splint.  Saw occupational doctor but will see ortho next Monday.    Not as active as she would like.  Would like to lose some wgh; about 40 lbs.  Feels she would have more energy and feel better about herself.  HM: over due for MMG.  "My anxiety does not allow me too."  Had a scare with RT breast in  2018 where she had bx.  Afraid to have MMG since then.  Had c-scope 10 yrs ago which was negative.  Due for Tdapt and Shingrix.  She had hysterectomy for noncancerous reasons. Patient Active Problem List   Diagnosis Date Noted   Dry skin dermatitis 06/15/2014   Rash 05/18/2014   Anemia 01/10/2014   Symptomatic anemia 01/10/2014   Chest pain of uncertain etiology 40/98/1191   Breast cancer of upper-outer quadrant of left female breast ([place]) 08/12/2013     No current outpatient medications on file prior to visit.   No current facility-administered medications on file prior to visit.    Allergies  Allergen Reactions   Hydrocodone Other (See Comments)    Makes very constipated     Social History   Socioeconomic History   Marital status: Divorced    Spouse name: Not on file   Number of children: Not on file   Years of education: Not on file   Highest education level: Not on file  Occupational History   Not on file  Tobacco Use   Smoking status: Former    Packs/day: 0.25    Years: 2.50  Pack years: 0.63    Types: Cigarettes    Quit date: 02/03/2011    Years since quitting: 10.1   Smokeless tobacco: Never  Vaping Use   Vaping Use: Never used  Substance and Sexual Activity   Alcohol use: No    Alcohol/week: 0.0 standard drinks    Comment: occasionally   Drug use: No   Sexual activity: Yes  Other Topics Concern   Not on file  Social History Narrative   Not on file   Social Determinants of Health   Financial Resource Strain: Not on file  Food Insecurity: Not on file  Transportation Needs: Not on file  Physical Activity: Not on file  Stress: Not on file  Social Connections: Not on file  Intimate Partner Violence: Not on file    Family History  Problem Relation Age of Onset   Hypertension Mother    Diabetes Mother    Hypertension Father    Heart failure Father    Heart attack Father    Breast cancer Paternal Aunt    Cervical cancer Paternal Aunt     Colon cancer Paternal Uncle    Prostate cancer Paternal Uncle     Past Surgical History:  Procedure Laterality Date   ABDOMINAL HYSTERECTOMY  12/16/2007   FOOT SURGERY Right    Lumpectory Left  August 30, 2013   [facility] REMOVAL Left 01/19/2015   Procedure: MINOR REMOVAL PORT-A-CATH;  Surgeon: [name], MD;  Location: [place];  Service: General;  Laterality: Left;   PORTACATH PLACEMENT Right 09/26/2013   Procedure: INSERTION PORT-A-CATH;  Surgeon: Shann Medal, MD;  Location: [facility];  Service: General;  Laterality: Right;    ROS: Review of Systems Negative except as stated above  PHYSICAL EXAM: BP (!) 170/113 (BP Location: Right Arm, Patient Position: Sitting, Cuff Size: Large)    Pulse 83    Resp 16    Ht 5\' 6"  (1.676 m)    Wt 191 lb 3.2 oz (86.7 kg)    LMP 11/03/2008    SpO2 100%    BMI 30.86 kg/m   Wt Readings from Last 3 Encounters:  04/03/21 191 lb 3.2 oz (86.7 kg)  10/23/15 183 lb 9.6 oz (83.3 kg)  03/30/15 183 lb 14.4 oz (83.4 kg)  BP 170/133  Physical Exam  General appearance - alert, well appearing, and in no distress Mental status - normal mood, behavior, speech, dress, motor activity, and thought processes Eyes - pupils equal and reactive, extraocular eye movements intact Nose - normal and patent, no erythema, discharge or polyps Mouth - mucous membranes moist, pharynx normal without lesions Neck - supple, no significant adenopathy Chest - clear to auscultation, no wheezes, rales or rhonchi, symmetric air entry Heart - normal rate, regular rhythm, normal S1, S2, no murmurs, rubs, clicks or gallops Extremities - peripheral pulses normal, no pedal edema, no clubbing or cyanosis MSK: Left arm and hand  are wrapped in splint from about the mid upper arm to the mid hand  GAD 7 : Generalized Anxiety Score 04/03/2021  Nervous, Anxious, on Edge 2  Control/stop worrying 3  Worry too much - different things 3  Trouble relaxing 2  Restless 1   Easily annoyed or irritable 2  Afraid - awful might happen 2  Total GAD 7 Score 15    Depression screen [facility] 2/9 04/03/2021 01/10/2014 10/04/2013  Decreased Interest 1 1 0  Down, Depressed, Hopeless 1 1 0  PHQ - 2 Score 2 2  0  Altered sleeping 0 0 -  Tired, decreased energy 1 1 -  Change in appetite 1 0 -  Feeling bad or failure about yourself  - 0 -  Trouble concentrating - 1 -  Moving slowly or fidgety/restless 0 0 -  Suicidal thoughts 0 0 -  PHQ-9 Score 4 4 -    CMP Latest Ref Rng & Units 10/23/2015 03/30/2015 09/27/2014  Glucose 70 - 140 mg/dl 132 111 109  BUN 7.0 - 26.0 mg/dL 15.7 17.7 22.0  Creatinine 0.6 - 1.1 mg/dL 0.9 0.9 0.9  Sodium 136 - 145 mEq/L 141 140 141  Potassium 3.5 - 5.1 mEq/L 4.0 3.9 3.8  Chloride 96 - 112 mEq/L - - -  CO2 22 - 29 mEq/L 26 27 26   Calcium 8.4 - 10.4 mg/dL 9.3 9.5 9.5  Total Protein 6.4 - 8.3 g/dL 7.2 7.6 7.1  Total Bilirubin 0.20 - 1.20 mg/dL <0.30 <0.30 0.21  Alkaline Phos 40 - 150 U/L 74 72 80  AST 5 - 34 U/L 19 20 26   ALT 0 - 55 U/L 17 15 20    Lipid Panel  No results found for: CHOL, TRIG, HDL, CHOLHDL, VLDL, LDLCALC, LDLDIRECT  CBC    Component Value Date/Time   WBC 3.3 (L) 10/23/2015 1059   WBC 5.8 01/10/2014 1639   RBC 4.06 10/23/2015 1059   RBC 3.31 (L) 01/10/2014 1700   RBC 3.30 (L) 01/10/2014 1639   HGB 11.2 (L) 10/23/2015 1059   HCT 33.8 (L) 10/23/2015 1059   PLT 340 10/23/2015 1059   MCV 83.1 10/23/2015 1059   MCH 27.5 10/23/2015 1059   MCH 27.0 01/10/2014 1639   MCHC 33.1 10/23/2015 1059   MCHC 31.2 01/10/2014 1639   RDW 14.2 10/23/2015 1059   LYMPHSABS 1.3 10/23/2015 1059   MONOABS 0.2 10/23/2015 1059   EOSABS 0.1 10/23/2015 1059   BASOSABS 0.0 10/23/2015 1059    ASSESSMENT AND PLAN: 1. Establishing care with new doctor, encounter for Requested that she sign a release for me to get her records from her previous PCP.  2. Essential hypertension Not at goal of 130/80 or lower.  At risk for cardiovascular events.   Recommend restarting lisinopril/hydrochlorothiazide.  We will get baseline blood test today including chemistry.  DASH diet discussed and encouraged. Follow-up with clinical pharmacist in 2 weeks for repeat blood pressure check - CBC - Comprehensive metabolic panel - Lipid panel  3. Obesity (BMI 30.0-34.9) Patient advised to eliminate sugary drinks from the diet, cut back on portion sizes especially of white carbohydrates, eat more white lean meat like chicken [place] and seafood instead of beef or pork and incorporate fresh fruits and vegetables into the diet daily. -Encouraged her to try to incorporate exercise into her daily activity with goal of getting in about 150 minutes/week total of moderate intensity exercise  4. GAD (generalized anxiety disorder) 5. Major depressive disorder, single episode, mild ([place]) -Patient reports doing well on Effexor in the past.  She is agreeable to restarting Effexor.  Advised if she has any increased depression or suicidal ideation on the medication she should stop it and let me know.  She is also agreeable to being referred for some counseling. - venlafaxine XR (EFFEXOR-XR) 37.5 MG 24 hr capsule; Take 1 capsule (37.5 mg total) by mouth daily with breakfast.  Dispense: 30 capsule; Refill: 3  6. History of left breast cancer 7. Encounter for screening mammogram for malignant neoplasm of breast Encourage patient to get her mammogram  done.  Reminded her of the importance of screening which is early detection.  She is agreeable for me to submit the referral for her mammogram. - MM DIGITAL SCREENING BILATERAL; Future  8. Screening for colon cancer Discussed colon cancer screening.  She is agreeable to referral for colonoscopy. - Ambulatory referral to Gastroenterology  9. Need for diphtheria-tetanus-pertussis (Tdap) vaccine - Tdap vaccine greater than or equal to 7yo IM  10. Need for shingles vaccine Patient is agreeable to receiving the Shingrix vaccine  but we decided to put off giving it to her today given that she got the Tdap and the other arm is in a splint.  I will have the clinical pharmacist give her the first Shingrix shot when she comes to get blood pressure check in 2 weeks.    Patient was given the opportunity to ask questions.  Patient verbalized understanding of the plan and was able to repeat key elements of the plan.   This documentation was completed using Radio producer.  Any transcriptional errors are unintentional.  Orders Placed This Encounter  Procedures   MM DIGITAL SCREENING BILATERAL   Tdap vaccine greater than or equal to 7yo IM   CBC   Comprehensive metabolic panel   Lipid panel   Ambulatory referral to Gastroenterology     Requested Prescriptions   Signed Prescriptions Disp Refills   lisinopril-hydrochlorothiazide (ZESTORETIC) 10-12.5 MG tablet 30 tablet 6    Sig: Take 1 tablet by mouth daily.   venlafaxine XR (EFFEXOR-XR) 37.5 MG 24 hr capsule 30 capsule 3    Sig: Take 1 capsule (37.5 mg total) by mouth daily with breakfast.    Return in about 3 months (around 07/04/2021) for Appt with [facility] in 2 wks for BP check and for shingrix.  Karle Plumber, MD, FACP

## 2021-04-04 LAB — COMPREHENSIVE METABOLIC PANEL
ALT: 13 IU/L (ref 0–32)
AST: 17 IU/L (ref 0–40)
Albumin/Globulin Ratio: 1.6 (ref 1.2–2.2)
Albumin: 4.7 g/dL (ref 3.8–4.9)
Alkaline Phosphatase: 94 IU/L (ref 44–121)
BUN/Creatinine Ratio: 21 (ref 9–23)
BUN: 17 mg/dL (ref 6–24)
Bilirubin Total: <0.2 mg/dL (ref 0.0–1.2)
CO2: 24 mmol/L (ref 20–29)
Calcium: 9.8 mg/dL (ref 8.7–10.2)
Chloride: 104 mmol/L (ref 96–106)
Creatinine, Ser: 0.82 mg/dL (ref 0.57–1.00)
Globulin, Total: 3 g/dL (ref 1.5–4.5)
Glucose: 91 mg/dL (ref 70–99)
Potassium: 4.1 mmol/L (ref 3.5–5.2)
Sodium: 142 mmol/L (ref 134–144)
Total Protein: 7.7 g/dL (ref 6.0–8.5)
eGFR: 82 mL/min/{1.73_m2} (ref >59)

## 2021-04-04 LAB — CBC
Hematocrit: 41.1 % (ref 34.0–46.6)
Hemoglobin: 13.6 g/dL (ref 11.1–15.9)
MCH: 27.2 pg (ref 26.6–33.0)
MCHC: 33.1 g/dL (ref 31.5–35.7)
MCV: 82 fL (ref 79–97)
Platelets: 391 10*3/uL (ref 150–450)
RBC: 5 x10E6/uL (ref 3.77–5.28)
RDW: 13.7 % (ref 11.7–15.4)
WBC: 3.8 10*3/uL (ref 3.4–10.8)

## 2021-04-04 LAB — LIPID PANEL
Chol/HDL Ratio: 4.8 ratio — ABNORMAL HIGH (ref 0.0–4.4)
Cholesterol, Total: 264 mg/dL — ABNORMAL HIGH (ref 100–199)
HDL: 55 mg/dL (ref >39)
LDL Chol Calc (NIH): 183 mg/dL — ABNORMAL HIGH (ref 0–99)
Triglycerides: 144 mg/dL (ref 0–149)
VLDL Cholesterol Cal: 26 mg/dL (ref 5–40)

## 2021-04-04 NOTE — Progress Notes (Signed)
Blood cell counts are normal. ?Kidney and liver function tests are good. ?Cholesterol level is elevated.  LDL cholesterol is 183 with goal being less than 100.  High cholesterol increases risk for heart attack and strokes.  Healthy eating habits and regular exercise will help to lower cholesterol.  I also recommend starting a medication called atorvastatin to help lower cholesterol.  Please let me know if patient is agreeable to starting the medication and I can send the prescription to her pharmacy. ? ?The rest of this is for my information. ?The 10-year ASCVD risk score (Arnett DK, et al., 2019) is: 18.5% ?  Values used to calculate the score: ?    Age: 61 years ?    Sex: Female ?    Is Non-Hispanic African American: Yes ?    Diabetic: No ?    Tobacco smoker: No ?    Systolic Blood Pressure: 502 mmHg ?    Is BP treated: Yes ?    HDL Cholesterol: 55 mg/dL ?    Total Cholesterol: 264 mg/dL ?

## 2021-04-18 ENCOUNTER — Telehealth: Payer: Self-pay

## 2021-04-18 MED ORDER — ATORVASTATIN CALCIUM 10 MG PO TABS: 10.0000 mg | ORAL_TABLET | Freq: Every day | ORAL | 3 refills | Status: DC

## 2021-04-18 NOTE — Telephone Encounter (Signed)
Rxn for Lipitor sent to pharmacy. ?

## 2021-04-18 NOTE — Telephone Encounter (Signed)
Contacted pt to go over lab results pt is aware  ? ?Pt states she is okay with starting the medication for her cholesterol. Pt is aware that provider will send rx to pharmacy  ?

## 2021-04-18 NOTE — Addendum Note (Signed)
Addended by: Karle Plumber B on: 04/18/2021 11:39 AM ? ? Modules accepted: Orders ? ?

## 2021-04-30 ENCOUNTER — Other Ambulatory Visit: Payer: Self-pay

## 2021-04-30 ENCOUNTER — Ambulatory Visit: Payer: 59 | Attending: Internal Medicine | Admitting: Pharmacist

## 2021-04-30 VITALS — BP 118/82 | HR 67

## 2021-04-30 DIAGNOSIS — I10 Essential (primary) hypertension: Secondary | ICD-10-CM | POA: Diagnosis not present

## 2021-04-30 DIAGNOSIS — Z23 Encounter for immunization: Secondary | ICD-10-CM | POA: Diagnosis not present

## 2021-04-30 NOTE — Progress Notes (Signed)
? ?S:    ? ?No chief complaint on file. ? ? ?Jean [name] is a 61 y.o. female who presents for hypertension evaluation, education, and management. PMH is significant for HTN. Patient was referred and last seen by Primary Care Provider, Dr. Wynetta Emery, on 04/03/2021. BP was elevated at that visit. It was noted at that visit that Jean [name] had been without BP medication but had taken lisinopril-HCTZ in the past with good control. ? ?Today, Jean [name] arrives in good spirits and presents without assistance.  Denies dizziness, headache, blurred vision, swelling.  ? ?Patient reports hypertension was diagnosed in 2015.  ? ?Family/Social history:  ?Fhx: HTN, MI (father) ?Tobacco: never smoker  ?Alcohol: none  ? ?Medication adherence reported. Patient has taken BP medications today.  ? ?Current antihypertensives include:  lisinopril-HCTZ 10-12.5 mg daily  ? ?Reported home BP readings:  ?-Reports 128/90 mmHg this morning  ?-She is able to check her BP periodically at home. Can tell it's coming down.  ? ?Patient reported dietary habits:  ?-Admits to eating a lot frozen meals that contain sodium ?-Does limit fast food  ?-"I like coffee" - drinks several cups throughout the day  ? ?Patient-reported exercise habits:  ?- Walks her dogs a couple of times a week ?- 1 hour when she does go on those weeks  ? ?O:  ?Vitals:  ? 04/30/21 1359  ?BP: 118/82  ?Pulse: 67  ? ? ?Last 3 Office BP readings: ?BP Readings from Last 3 Encounters:  ?04/30/21 118/82  ?04/03/21 (!) 170/113  ?05/12/16 (!) 123/95  ? ? ?BMET ?   ?Component Value Date/Time  ? NA 142 04/03/2021 1523  ? NA 141 10/23/2015 1059  ? K 4.1 04/03/2021 1523  ? K 4.0 10/23/2015 1059  ? CL 104 04/03/2021 1523  ? CO2 24 04/03/2021 1523  ? CO2 26 10/23/2015 1059  ? GLUCOSE 91 04/03/2021 1523  ? GLUCOSE 132 10/23/2015 1059  ? BUN 17 04/03/2021 1523  ? BUN 15.7 10/23/2015 1059  ? CREATININE 0.82 04/03/2021 1523  ? CREATININE 0.9 10/23/2015 1059  ? CALCIUM 9.8 04/03/2021 1523  ? CALCIUM  9.3 10/23/2015 1059  ? GFRNONAA 83 (L) 01/10/2014 1639  ? GFRAA >90 01/10/2014 1639  ? ? ?Renal function: ?CrCl cannot be calculated (Patient's most recent lab result is older than the maximum 21 days allowed.). ? ?Clinical ASCVD: No  ?The 10-year ASCVD risk score (Arnett DK, et al., 2019) is: 6.5% ?  Values used to calculate the score: ?    Age: 63 years ?    Sex: Female ?    Is Non-Hispanic African American: Yes ?    Diabetic: No ?    Tobacco smoker: No ?    Systolic Blood Pressure: 161 mmHg ?    Is BP treated: Yes ?    HDL Cholesterol: 55 mg/dL ?    Total Cholesterol: 264 mg/dL ? ? ?A/P: ?Hypertension longstanding currently at goal on current medications. BP goal < 130/80 mmHg. Medication adherence appears appropriate.  ?-Continued current medications.  ?-F/u labs ordered - none. Anticipate BMP at follow-up. ?-Counseled on lifestyle modifications for blood pressure control including reduced dietary sodium, increased exercise, adequate sleep. ?-Encouraged patient to check BP at home and bring log of readings to next visit. Counseled on proper use of home BP cuff.  ?-HM: Shingrix given.  ? ?Results reviewed and written information provided. Patient verbalized understanding of treatment plan. Total time in face-to-face counseling 30 minutes.  ? ?F/u clinic visit in  2 months for Shingrix #2 and BP check. ? ?Benard Halsted, PharmD, BCACP, CPP ?Clinical Pharmacist ?[facility] ?[phone] ? ? ?

## 2021-05-06 DIAGNOSIS — S63502A Unspecified sprain of left wrist, initial encounter: Secondary | ICD-10-CM | POA: Insufficient documentation

## 2021-05-28 ENCOUNTER — Other Ambulatory Visit: Payer: Self-pay | Admitting: Internal Medicine

## 2021-05-28 DIAGNOSIS — F320 Major depressive disorder, single episode, mild: Secondary | ICD-10-CM

## 2021-05-28 DIAGNOSIS — F411 Generalized anxiety disorder: Secondary | ICD-10-CM

## 2021-05-28 NOTE — Telephone Encounter (Signed)
Medication Refill - Medication:  ?venlafaxine XR (EFFEXOR-XR) 37.5 MG 24 hr capsule  ? ?Has the patient contacted their pharmacy? Yes.   ?Contact PCP- haven't received a response from PCP ? ?Preferred Pharmacy (with phone number or street name):  ?[id number]- [place], [place] - [address] [id number] ?Phone:  [phone] Fax:  [phone] ? ?Has the patient been seen for an appointment in the last year OR does the patient have an upcoming appointment? Yes. ? ?Agent: Please be advised that RX refills may take up to 3 business days. We ask that you follow-up with your pharmacy. ?

## 2021-05-30 NOTE — Telephone Encounter (Signed)
Refilled 04/03/2021 #30 3 refills. Called pharmacy - medication will be refilled. Called to let her know medication is being refilled. ?Requested Prescriptions  ?Pending Prescriptions Disp Refills  ?? venlafaxine XR (EFFEXOR-XR) 37.5 MG 24 hr capsule 30 capsule 3  ?  Sig: Take 1 capsule (37.5 mg total) by mouth daily with breakfast.  ?  ? Psychiatry: Antidepressants - SNRI - desvenlafaxine & venlafaxine Failed - 05/29/2021 10:45 AM  ?  ?  Failed - Lipid Panel in normal range within the last 12 months  ?  Cholesterol, Total  ?Date Value Ref Range Status  ?04/03/2021 264 (H) 100 - 199 mg/dL Final  ? ?LDL Chol Calc (NIH)  ?Date Value Ref Range Status  ?04/03/2021 183 (H) 0 - 99 mg/dL Final  ? ?HDL  ?Date Value Ref Range Status  ?04/03/2021 55 >39 mg/dL Final  ? ?Triglycerides  ?Date Value Ref Range Status  ?04/03/2021 144 0 - 149 mg/dL Final  ? ?  ?  ?  Passed - Cr in normal range and within 360 days  ?  Creatinine  ?Date Value Ref Range Status  ?10/23/2015 0.9 0.6 - 1.1 mg/dL Final  ? ?Creatinine, Ser  ?Date Value Ref Range Status  ?04/03/2021 0.82 0.57 - 1.00 mg/dL Final  ?   ?  ?  Passed - Completed PHQ-2 or PHQ-9 in the last 360 days  ?  ?  Passed - Last BP in normal range  ?  BP Readings from Last 1 Encounters:  ?04/30/21 118/82  ?   ?  ?  Passed - Valid encounter within last 6 months  ?  Recent Outpatient Visits   ?      ? 1 month ago Essential hypertension  ? [place], RPH-CPP  ? 1 month ago Establishing care with new doctor, encounter for  ? [place] Ladell Pier, MD  ?  ?  ?Future Appointments   ?        ? In 1 month Ladell Pier, MD [place]  ?  ? ?  ?  ?  ? ?

## 2021-05-30 NOTE — Telephone Encounter (Signed)
Called pharmacy - Pt has refills available - Rx will be refilled. ?

## 2021-05-30 NOTE — Telephone Encounter (Signed)
Called pt to let her know that medication is being refilled by pharmacy. ?

## 2021-06-17 ENCOUNTER — Encounter: Payer: Self-pay | Admitting: Gastroenterology

## 2021-06-19 DIAGNOSIS — M7712 Lateral epicondylitis, left elbow: Secondary | ICD-10-CM | POA: Insufficient documentation

## 2021-06-27 ENCOUNTER — Ambulatory Visit (AMBULATORY_SURGERY_CENTER): Payer: Self-pay | Admitting: *Deleted

## 2021-06-27 VITALS — Ht 66.0 in | Wt 196.4 lb

## 2021-06-27 DIAGNOSIS — Z1211 Encounter for screening for malignant neoplasm of colon: Secondary | ICD-10-CM

## 2021-06-27 NOTE — Progress Notes (Signed)

## 2021-07-04 ENCOUNTER — Encounter: Payer: Self-pay | Admitting: Internal Medicine

## 2021-07-04 ENCOUNTER — Ambulatory Visit: Payer: 59 | Attending: Internal Medicine | Admitting: Internal Medicine

## 2021-07-04 VITALS — BP 105/73 | HR 96 | Temp 98.7°F | Resp 16 | Wt 193.0 lb

## 2021-07-04 DIAGNOSIS — R5383 Other fatigue: Secondary | ICD-10-CM

## 2021-07-04 DIAGNOSIS — I10 Essential (primary) hypertension: Secondary | ICD-10-CM | POA: Diagnosis not present

## 2021-07-04 DIAGNOSIS — Z23 Encounter for immunization: Secondary | ICD-10-CM

## 2021-07-04 DIAGNOSIS — E66811 Obesity, class 1: Secondary | ICD-10-CM

## 2021-07-04 DIAGNOSIS — F320 Major depressive disorder, single episode, mild: Secondary | ICD-10-CM

## 2021-07-04 DIAGNOSIS — R110 Nausea: Secondary | ICD-10-CM | POA: Diagnosis not present

## 2021-07-04 DIAGNOSIS — F411 Generalized anxiety disorder: Secondary | ICD-10-CM | POA: Diagnosis not present

## 2021-07-04 DIAGNOSIS — Z853 Personal history of malignant neoplasm of breast: Secondary | ICD-10-CM

## 2021-07-04 DIAGNOSIS — E669 Obesity, unspecified: Secondary | ICD-10-CM

## 2021-07-04 DIAGNOSIS — Z6831 Body mass index (BMI) 31.0-31.9, adult: Secondary | ICD-10-CM

## 2021-07-04 MED ORDER — ALPRAZOLAM 0.5 MG PO TABS: ORAL_TABLET | ORAL | 0 refills | Status: DC

## 2021-07-04 NOTE — Patient Instructions (Signed)

## 2021-07-04 NOTE — Progress Notes (Signed)
Request refill on alprazolam for mammogram Lack of energy while on supplements Address Covid booster

## 2021-07-04 NOTE — Progress Notes (Addendum)
Patient ID: Jean [name], female    DOB: 1960-05-05  MRN: [id number]  CC: Chronic disease management.  Subjective: Jean [name] is a 61 y.o. female who presents for chronic disease management. Her concerns today include:  Pt with hx of LT breast CA s/p lumpectomy, chemo and XRT (completed 5 yrs of Arimedex), former smoker (quit 2012),  HTN, GAD  Hx of Breast CA: Patient scheduled to have mammogram on the sixth of this month.  She is nervous about it.  She is requesting alprazolam to take prior to the mammogram.  HTN:  taking HCTZ and Lisinopril as prescribed.  Limits salt in foods Checks BP QOD. Forgot to bring log.  Some readings 105/80, 110/79.  Highest was 140/89  Reports some nausea when she starts to eat unless it is something sweet.  Has to suck on gum or piece of candy to get it to go away.  Yesterday she ate stir fried with shrimp and did not have a problem.  No abdominal pain or vomiting.  Moving bowels okay.  No blood in the stools. Trying to eat more greens and salads. Tends skip meals on days when she is off work Walk her dogs on days off which can be 3-4 days a wk for 30-45 mins  Anx/dep:  doing a lot better on Effexor.  Feels she is on adequate dose.   Complains of lacking energy.  She feels she sleeps okay.  Days when she does not have to work, she gets an 8 to 9 hours.  Does not sleep as well on days when she has to work because she feels she may oversleep.  She does have an alarm clock that is being reliable.  Sometimes she feels sleepy during the day.  Her grandson told her that she snores but not loud.  HL:  tolerating Lipitor  HM: Due for Shingrix.  C-scope scheduled 07/17/2021.  Wants to know if she needs COVID booster again right now.  She has had 4 shots last being 08/2020.  Patient Active Problem List   Diagnosis Date Noted   Essential hypertension 04/03/2021   Obesity (BMI 30.0-34.9) 04/03/2021   GAD (generalized anxiety disorder) 04/03/2021   Major depressive  disorder, single episode, mild ([place]) 04/03/2021   Dry skin dermatitis 06/15/2014   Rash 05/18/2014   Anemia 01/10/2014   Symptomatic anemia 01/10/2014   Chest pain of uncertain etiology 99/83/3825   Breast cancer of upper-outer quadrant of left female breast ([place]) 08/12/2013     Current Outpatient Medications on File Prior to Visit  Medication Sig Dispense Refill   atorvastatin (LIPITOR) 10 MG tablet Take 1 tablet (10 mg total) by mouth daily. 90 tablet 3   Cholecalciferol (VITAMIN D3 PO) Take by mouth daily. 2000 IU     Cyanocobalamin (VITAMIN B-12 SL)      lisinopril-hydrochlorothiazide (ZESTORETIC) 10-12.5 MG tablet Take 1 tablet by mouth daily. 30 tablet 6   Multiple Vitamin (MULTIVITAMIN PO) Take by mouth daily. CENTRUM     venlafaxine XR (EFFEXOR-XR) 37.5 MG 24 hr capsule Take 1 capsule (37.5 mg total) by mouth daily with breakfast. 30 capsule 3   ALPRAZolam (XANAX) 0.25 MG tablet alprazolam 0.25 mg tablet (Patient not taking: Reported on 06/27/2021)     No current facility-administered medications on file prior to visit.    Allergies  Allergen Reactions   Hydrocodone Other (See Comments)    Makes very constipated     Social History   Socioeconomic History  Marital status: Divorced    Spouse name: Not on file   Number of children: Not on file   Years of education: Not on file   Highest education level: Not on file  Occupational History   Not on file  Tobacco Use   Smoking status: Former    Packs/day: 0.25    Years: 2.50    Pack years: 0.63    Types: Cigarettes    Quit date: 02/03/2011    Years since quitting: 10.4    Passive exposure: Never   Smokeless tobacco: Never  Vaping Use   Vaping Use: Never used  Substance and Sexual Activity   Alcohol use: Yes    Comment: ONCE A WEEK   Drug use: No   Sexual activity: Yes  Other Topics Concern   Not on file  Social History Narrative   Not on file   Social Determinants of Health   Financial Resource Strain:  Not on file  Food Insecurity: Not on file  Transportation Needs: Not on file  Physical Activity: Not on file  Stress: Not on file  Social Connections: Not on file  Intimate Partner Violence: Not on file    Family History  Problem Relation Age of Onset   Hypertension Mother    Diabetes Mother    Hypertension Father    Heart failure Father    Heart attack Father    Breast cancer Paternal Aunt    Cervical cancer Paternal Aunt    Colon cancer Paternal Uncle    Prostate cancer Paternal Uncle    Crohn's disease Neg Hx    Esophageal cancer Neg Hx    Rectal cancer Neg Hx    Stomach cancer Neg Hx     Past Surgical History:  Procedure Laterality Date   ABDOMINAL HYSTERECTOMY  12/16/2007   FOOT SURGERY Right    Lumpectory Left  August 30, 2013   [facility] REMOVAL Left 01/19/2015   Procedure: MINOR REMOVAL PORT-A-CATH;  Surgeon: [name], MD;  Location: [place];  Service: General;  Laterality: Left;   PORTACATH PLACEMENT Right 09/26/2013   Procedure: INSERTION PORT-A-CATH;  Surgeon: Shann Medal, MD;  Location: [facility];  Service: General;  Laterality: Right;    ROS: Review of Systems Negative except as stated above  PHYSICAL EXAM: BP 105/73 (BP Location: Left Arm, Patient Position: Sitting, Cuff Size: Large)   Pulse 96   Temp 98.7 F (37.1 C) (Oral)   Resp 16   Wt 193 lb (87.5 kg)   LMP 11/03/2008   SpO2 98%   BMI 31.15 kg/m   Wt Readings from Last 3 Encounters:  07/04/21 193 lb (87.5 kg)  06/27/21 196 lb 6.4 oz (89.1 kg)  04/03/21 191 lb 3.2 oz (86.7 kg)    Physical Exam  General appearance - alert, well appearing, and in no distress Mental status - normal mood, behavior, speech, dress, motor activity, and thought processes Neck - supple, no significant adenopathy Chest - clear to auscultation, no wheezes, rales or rhonchi, symmetric air entry Heart - normal rate, regular rhythm, normal S1, S2, no murmurs, rubs, clicks or gallops Extremities  - peripheral pulses normal, no pedal edema, no clubbing or cyanosis     07/04/2021    1:58 PM 04/03/2021    2:13 PM  GAD 7 : Generalized Anxiety Score  Nervous, Anxious, on Edge 1 2  Control/stop worrying 2 3  Worry too much - different things 2 3  Trouble relaxing 0 2  Restless 0 1  Easily annoyed or irritable 1 2  Afraid - awful might happen 2 2  Total GAD 7 Score 8 15       07/04/2021    1:58 PM 04/03/2021    2:12 PM 01/10/2014   10:22 AM 10/04/2013    9:07 AM  Depression screen PHQ 2/9  Decreased Interest '1 1 1 '$ 0  Down, Depressed, Hopeless '1 1 1 '$ 0  PHQ - 2 Score '2 2 2 '$ 0  Altered sleeping 0 0 0   Tired, decreased energy '2 1 1   '$ Change in appetite 2 1 0   Feeling bad or failure about yourself  0  0   Trouble concentrating 1  1   Moving slowly or fidgety/restless 0 0 0   Suicidal thoughts 0 0 0   PHQ-9 Score '7 4 4        '$ Latest Ref Rng & Units 04/03/2021    3:23 PM 10/23/2015   10:59 AM 03/30/2015   10:10 AM  CMP  Glucose 70 - 99 mg/dL 91   132   111    BUN 6 - 24 mg/dL 17   15.7   17.7    Creatinine 0.57 - 1.00 mg/dL 0.82   0.9   0.9    Sodium 134 - 144 mmol/L 142   141   140    Potassium 3.5 - 5.2 mmol/L 4.1   4.0   3.9    Chloride 96 - 106 mmol/L 104      CO2 20 - 29 mmol/L '24   26   27    '$ Calcium 8.7 - 10.2 mg/dL 9.8   9.3   9.5    Total Protein 6.0 - 8.5 g/dL 7.7   7.2   7.6    Total Bilirubin 0.0 - 1.2 mg/dL <0.2   <0.30   <0.30    Alkaline Phos 44 - 121 IU/L 94   74   72    AST 0 - 40 IU/L '17   19   20    '$ ALT 0 - 32 IU/L '13   17   15     '$ Lipid Panel     Component Value Date/Time   CHOL 264 (H) 04/03/2021 1523   TRIG 144 04/03/2021 1523   HDL 55 04/03/2021 1523   CHOLHDL 4.8 (H) 04/03/2021 1523   LDLCALC 183 (H) 04/03/2021 1523    CBC    Component Value Date/Time   WBC 3.8 04/03/2021 1523   WBC 3.3 (L) 10/23/2015 1059   WBC 5.8 01/10/2014 1639   RBC 5.00 04/03/2021 1523   RBC 4.06 10/23/2015 1059   RBC 3.31 (L) 01/10/2014 1700   RBC 3.30 (L)  01/10/2014 1639   HGB 13.6 04/03/2021 1523   HGB 11.2 (L) 10/23/2015 1059   HCT 41.1 04/03/2021 1523   HCT 33.8 (L) 10/23/2015 1059   PLT 391 04/03/2021 1523   MCV 82 04/03/2021 1523   MCV 83.1 10/23/2015 1059   MCH 27.2 04/03/2021 1523   MCH 27.5 10/23/2015 1059   MCH 27.0 01/10/2014 1639   MCHC 33.1 04/03/2021 1523   MCHC 33.1 10/23/2015 1059   MCHC 31.2 01/10/2014 1639   RDW 13.7 04/03/2021 1523   RDW 14.2 10/23/2015 1059   LYMPHSABS 1.3 10/23/2015 1059   MONOABS 0.2 10/23/2015 1059   EOSABS 0.1 10/23/2015 1059   BASOSABS 0.0 10/23/2015 1059    ASSESSMENT AND PLAN:  1. Essential hypertension At goal.  Continue lisinopril/HCTZ.  2. Obesity (BMI 30.0-34.9) Commended her on trying to eat healthy.  Encouraged her to keep up the good work.  Commended her on regular exercise and encouraged her to continue.  3. GAD (generalized anxiety disorder) We will give prescription for Xanax 0.5 mg to take half to 1 hour before having her mammogram done.  She may repeat x1 if needed.  Her sister will be going with her to drive. Anxiety and depression improved on Effexor.  However she is having some nausea that I think may be due to the Effexor.  We agreed to continue the Effexor for now but consider changing to something different if the nausea persists - ALPRAZolam (XANAX) 0.5 MG tablet; 1 tab 1/2-1 hr prior to procedure.  May repeat x 1 if neeed  Dispense: 2 tablet; Refill: 0  4. Major depressive disorder, single episode, mild ([place]) See #3 above.  5. Nausea in adult See #3 above.  6. Fatigue, unspecified type Advised patient to find out from her grandson whether she snores loud because if she does she may have sleep apnea and we can order a sleep study.  Patient does not think she does but she will find out.  Also encouraged her to try to sleep peacefully knowing that she does have an alarm clock that has been reliable and going off and getting her up for work.  7. Need for shingles  vaccine Given first Shingrix shot today.  Advised that it can cause some redness and soreness at the injection site for several days.  8. History of breast cancer She will have her mammogram done on the sixth of this month.  Given Xanax to take half hour to an hour prior.  Not due for COVID booster at this time. Patient was given the opportunity to ask questions.  Patient verbalized understanding of the plan and was able to repeat key elements of the plan.   This documentation was completed using Radio producer.  Any transcriptional errors are unintentional.  Orders Placed This Encounter  Procedures   Varicella-zoster vaccine IM (Shingrix)     Requested Prescriptions    No prescriptions requested or ordered in this encounter    No follow-ups on file.  Karle Plumber, MD, FACP

## 2021-07-09 ENCOUNTER — Ambulatory Visit: Payer: 59

## 2021-07-11 ENCOUNTER — Encounter: Payer: Self-pay | Admitting: Gastroenterology

## 2021-07-15 ENCOUNTER — Encounter: Payer: Self-pay | Admitting: Gastroenterology

## 2021-07-17 ENCOUNTER — Encounter: Payer: Self-pay | Admitting: Gastroenterology

## 2021-07-17 ENCOUNTER — Ambulatory Visit (AMBULATORY_SURGERY_CENTER): Payer: 59 | Admitting: Gastroenterology

## 2021-07-17 VITALS — BP 119/63 | HR 65 | Temp 97.1°F | Resp 18 | Ht 66.0 in | Wt 196.4 lb

## 2021-07-17 DIAGNOSIS — Z1211 Encounter for screening for malignant neoplasm of colon: Secondary | ICD-10-CM | POA: Diagnosis not present

## 2021-07-17 MED ORDER — SODIUM CHLORIDE 0.9 % IV SOLN: 500.0000 mL | Freq: Once | INTRAVENOUS | Status: DC

## 2021-07-17 NOTE — Progress Notes (Signed)
GASTROENTEROLOGY PROCEDURE H&P NOTE   Primary Care Physician: Ladell Pier, MD  HPI: Jean [name] is a 61 y.o. female who presents for Colonoscopy for screening.  Past Medical History:  Diagnosis Date   Anemia    Anxiety    Arthritis    back   Breast cancer ([place]) 08/03/2013   left   Cataract    LEFT FORMING   Dental crown present    Depression    GERD (gastroesophageal reflux disease)    no current med.   Hyperlipidemia    Hypertension    Radiation 01/18/14-03/09/14   Left breast   Past Surgical History:  Procedure Laterality Date   ABDOMINAL HYSTERECTOMY  12/16/2007   FOOT SURGERY Right    Lumpectory Left  August 30, 2013   [facility] REMOVAL Left 01/19/2015   Procedure: MINOR REMOVAL PORT-A-CATH;  Surgeon: [name], MD;  Location: [place];  Service: General;  Laterality: Left;   PORTACATH PLACEMENT Right 09/26/2013   Procedure: INSERTION PORT-A-CATH;  Surgeon: Shann Medal, MD;  Location: [facility];  Service: General;  Laterality: Right;   Current Outpatient Medications  Medication Sig Dispense Refill   ALPRAZolam (XANAX) 0.5 MG tablet 1 tab 1/2-1 hr prior to procedure.  May repeat x 1 if neeed 2 tablet 0   atorvastatin (LIPITOR) 10 MG tablet Take 1 tablet (10 mg total) by mouth daily. 90 tablet 3   Cholecalciferol (VITAMIN D3 PO) Take by mouth daily. 2000 IU     Cyanocobalamin (VITAMIN B-12 SL)      lisinopril-hydrochlorothiazide (ZESTORETIC) 10-12.5 MG tablet Take 1 tablet by mouth daily. 30 tablet 6   Multiple Vitamin (MULTIVITAMIN PO) Take by mouth daily. CENTRUM     venlafaxine XR (EFFEXOR-XR) 37.5 MG 24 hr capsule Take 1 capsule (37.5 mg total) by mouth daily with breakfast. 30 capsule 3   No current facility-administered medications for this visit.    Current Outpatient Medications:    ALPRAZolam (XANAX) 0.5 MG tablet, 1 tab 1/2-1 hr prior to procedure.  May repeat x 1 if neeed, Disp: 2 tablet, Rfl: 0   atorvastatin (LIPITOR) 10  MG tablet, Take 1 tablet (10 mg total) by mouth daily., Disp: 90 tablet, Rfl: 3   Cholecalciferol (VITAMIN D3 PO), Take by mouth daily. 2000 IU, Disp: , Rfl:    Cyanocobalamin (VITAMIN B-12 SL), , Disp: , Rfl:    lisinopril-hydrochlorothiazide (ZESTORETIC) 10-12.5 MG tablet, Take 1 tablet by mouth daily., Disp: 30 tablet, Rfl: 6   Multiple Vitamin (MULTIVITAMIN PO), Take by mouth daily. CENTRUM, Disp: , Rfl:    venlafaxine XR (EFFEXOR-XR) 37.5 MG 24 hr capsule, Take 1 capsule (37.5 mg total) by mouth daily with breakfast., Disp: 30 capsule, Rfl: 3 Allergies  Allergen Reactions   Hydrocodone Other (See Comments)    Makes very constipated    Family History  Problem Relation Age of Onset   Hypertension Mother    Diabetes Mother    Hypertension Father    Heart failure Father    Heart attack Father    Breast cancer Paternal Aunt    Cervical cancer Paternal Aunt    Colon cancer Paternal Uncle    Prostate cancer Paternal Uncle    Crohn's disease Neg Hx    Esophageal cancer Neg Hx    Rectal cancer Neg Hx    Stomach cancer Neg Hx    Social History   Socioeconomic History   Marital status: Divorced    Spouse name: Not on  file   Number of children: Not on file   Years of education: Not on file   Highest education level: Not on file  Occupational History   Not on file  Tobacco Use   Smoking status: Former    Packs/day: 0.25    Years: 2.50    Total pack years: 0.63    Types: Cigarettes    Quit date: 02/03/2011    Years since quitting: 10.4    Passive exposure: Never   Smokeless tobacco: Never  Vaping Use   Vaping Use: Never used  Substance and Sexual Activity   Alcohol use: Yes    Comment: ONCE A WEEK   Drug use: No   Sexual activity: Yes  Other Topics Concern   Not on file  Social History Narrative   Not on file   Social Determinants of Health   Financial Resource Strain: Not on file  Food Insecurity: Not on file  Transportation Needs: Not on file  Physical  Activity: Not on file  Stress: Not on file  Social Connections: Not on file  Intimate Partner Violence: Not on file    Physical Exam: There were no vitals filed for this visit. There is no height or weight on file to calculate BMI. GEN: NAD EYE: Sclerae anicteric ENT: MMM CV: Non-tachycardic GI: Soft, NT/ND NEURO:  Alert & Oriented x 3  Lab Results: No results for input(s): "WBC", "HGB", "HCT", "PLT" in the last 72 hours. BMET No results for input(s): "NA", "K", "CL", "CO2", "GLUCOSE", "BUN", "CREATININE", "CALCIUM" in the last 72 hours. LFT No results for input(s): "PROT", "ALBUMIN", "AST", "ALT", "ALKPHOS", "BILITOT", "BILIDIR", "IBILI" in the last 72 hours. PT/INR No results for input(s): "LABPROT", "INR" in the last 72 hours.   Impression / Plan: This is a 61 y.o.female who presents for Colonoscopy for screening.  The risks and benefits of endoscopic evaluation/treatment were discussed with the patient and/or family; these include but are not limited to the risk of perforation, infection, bleeding, missed lesions, lack of diagnosis, severe illness requiring hospitalization, as well as anesthesia and sedation related illnesses.  The patient's history has been reviewed, patient examined, no change in status, and deemed stable for procedure.  The patient and/or family is agreeable to proceed.    Justice Britain, MD [facility] Office # [phone]

## 2021-07-17 NOTE — Progress Notes (Signed)
Sedate, gd SR, tolerated procedure well, VSS, report to RN 

## 2021-07-17 NOTE — Progress Notes (Signed)
Pt's states no medical or surgical changes since previsit or office visit. 

## 2021-07-17 NOTE — Patient Instructions (Signed)
Handout on hemorrhoids given.  High Fiber diet recommended.  FiberCon 1-2 tablets daily.YOU HAD AN ENDOSCOPIC PROCEDURE TODAY AT [facility]:   Refer to the procedure report that was given to you for any specific questions about what was found during the examination.  If the procedure report does not answer your questions, please call your gastroenterologist to clarify.  If you requested that your care partner not be given the details of your procedure findings, then the procedure report has been included in a sealed envelope for you to review at your convenience later.  YOU SHOULD EXPECT: Some feelings of bloating in the abdomen. Passage of more gas than usual.  Walking can help get rid of the air that was put into your GI tract during the procedure and reduce the bloating. If you had a lower endoscopy (such as a colonoscopy or flexible sigmoidoscopy) you may notice spotting of blood in your stool or on the toilet paper. If you underwent a bowel prep for your procedure, you may not have a normal bowel movement for a few days.  Please Note:  You might notice some irritation and congestion in your nose or some drainage.  This is from the oxygen used during your procedure.  There is no need for concern and it should clear up in a day or so.  SYMPTOMS TO REPORT IMMEDIATELY:  Following lower endoscopy (colonoscopy or flexible sigmoidoscopy):  Excessive amounts of blood in the stool  Significant tenderness or worsening of abdominal pains  Swelling of the abdomen that is new, acute  Fever of 100F or higher   For urgent or emergent issues, a gastroenterologist can be reached at any hour by calling [phone]. Do not use MyChart messaging for urgent concerns.    DIET:  We do recommend a small meal at first, but then you may proceed to your regular diet.  Drink plenty of fluids but you should avoid alcoholic beverages for 24 hours.  ACTIVITY:  You should plan to take it easy for  the rest of today and you should NOT DRIVE or use heavy machinery until tomorrow (because of the sedation medicines used during the test).    FOLLOW UP: Our staff will call the number listed on your records 24-72 hours following your procedure to check on you and address any questions or concerns that you may have regarding the information given to you following your procedure. If we do not reach you, we will leave a message.  We will attempt to reach you two times.  During this call, we will ask if you have developed any symptoms of COVID 19. If you develop any symptoms (ie: fever, flu-like symptoms, shortness of breath, cough etc.) before then, please call [phone].  If you test positive for Covid 19 in the 2 weeks post procedure, please call and report this information to [place].    If any biopsies were taken you will be contacted by phone or by letter within the next 1-3 weeks.  Please call us at [phone] if you have not heard about the biopsies in 3 weeks.    SIGNATURES/CONFIDENTIALITY: You and/or your care partner have signed paperwork which will be entered into your electronic medical record.  These signatures attest to the fact that that the information above on your After Visit Summary has been reviewed and is understood.  Full responsibility of the confidentiality of this discharge information lies with you and/or your care-partner.

## 2021-07-17 NOTE — Op Note (Signed)
[name] Procedure Date: 07/17/2021 10:36 AM MRN: [id number] Endoscopist: Justice Britain , MD Age: 61 Referring MD:  Date of Birth: 06/29/1960 Gender: Female Account #: [id number] Procedure:                Colonoscopy Indications:              Screening for colorectal malignant neoplasm Medicines:                Monitored Anesthesia Care Procedure:                Pre-Anesthesia Assessment:                           - Prior to the procedure, a History and Physical                            was performed, and patient medications and                            allergies were reviewed. The patient's tolerance of                            previous anesthesia was also reviewed. The risks                            and benefits of the procedure and the sedation                            options and risks were discussed with the patient.                            All questions were answered, and informed consent                            was obtained. Prior Anticoagulants: The patient has                            taken no previous anticoagulant or antiplatelet                            agents. ASA Grade Assessment: II - A patient with                            mild systemic disease. After reviewing the risks                            and benefits, the patient was deemed in                            satisfactory condition to undergo the procedure.                           After obtaining informed consent, the colonoscope  was passed under direct vision. Throughout the                            procedure, the patient's blood pressure, pulse, and                            oxygen saturations were monitored continuously. The                            Olympus CF-HQ190L [id number] [id number]) Colonoscope was                            introduced through the anus and advanced to the 5                            cm into the ileum.  The colonoscopy was performed                            without difficulty. The patient tolerated the                            procedure. The quality of the bowel preparation was                            good. The terminal ileum, ileocecal valve,                            appendiceal orifice, and rectum were photographed. Scope In: 10:48:28 AM Scope Out: 11:59:55 AM Scope Withdrawal Time: 1 hour 9 minutes 0 seconds  Total Procedure Duration: 1 hour 11 minutes 27 seconds  Findings:                 The digital rectal exam findings include                            hemorrhoids. Pertinent negatives include no                            palpable rectal lesions.                           The terminal ileum and ileocecal valve appeared                            normal.                           Normal mucosa was found in the entire colon.                           Non-bleeding non-thrombosed internal hemorrhoids                            were found during retroflexion, during perianal  exam and during digital exam. The hemorrhoids were                            Grade II (internal hemorrhoids that prolapse but                            reduce spontaneously). Complications:            No immediate complications. Estimated Blood Loss:     Estimated blood loss was minimal. Estimated blood                            loss: none. Impression:               - Hemorrhoids found on digital rectal exam.                           - The examined portion of the ileum was normal.                           - Normal mucosa in the entire examined colon.                           - Non-bleeding non-thrombosed internal hemorrhoids. Recommendation:           - The patient will be observed post-procedure,                            until all discharge criteria are met.                           - Discharge patient to home.                           - Patient has a contact number  available for                            emergencies. The signs and symptoms of potential                            delayed complications were discussed with the                            patient. Return to normal activities tomorrow.                            Written discharge instructions were provided to the                            patient.                           - High fiber diet.                           - Use FiberCon 1-2 tablets PO daily.                           -  Continue present medications.                           - Repeat colonoscopy in 10 years for screening                            purposes.                           - The findings and recommendations were discussed                            with the patient.                           - The findings and recommendations were discussed                            with the patient's family. Justice [place], MD 07/17/2021 11:04:21 AM

## 2021-07-18 ENCOUNTER — Telehealth: Payer: Self-pay

## 2021-07-18 ENCOUNTER — Telehealth: Payer: Self-pay | Admitting: *Deleted

## 2021-07-18 NOTE — Telephone Encounter (Signed)
Left message on follow up call. 

## 2021-07-18 NOTE — Telephone Encounter (Signed)
2

## 2021-08-21 ENCOUNTER — Other Ambulatory Visit: Payer: Self-pay | Admitting: Internal Medicine

## 2021-08-21 DIAGNOSIS — F320 Major depressive disorder, single episode, mild: Secondary | ICD-10-CM

## 2021-08-21 DIAGNOSIS — F411 Generalized anxiety disorder: Secondary | ICD-10-CM

## 2021-08-21 NOTE — Telephone Encounter (Signed)
Medication Refill - Medication: venlafaxine XR (EFFEXOR-XR) 37.5 MG 24 hr capsule  Has the patient contacted their pharmacy? Yes.    (Agent: If yes, when and what did the pharmacy advise?) call provider  Preferred Pharmacy (with phone number or street name):  [id number] [place]:  [phone] Fax:  [phone]    Has the patient been seen for an appointment in the last year OR does the patient have an upcoming appointment? Yes.    Agent: Please be advised that RX refills may take up to 3 business days. We ask that you follow-up with your pharmacy.  Patient requesting a dosage increase

## 2021-08-21 NOTE — Telephone Encounter (Signed)
Patient requesting a dosage increase

## 2021-08-22 MED ORDER — VENLAFAXINE HCL ER 37.5 MG PO CP24: 37.5000 mg | ORAL_CAPSULE | Freq: Every day | ORAL | 0 refills | Status: DC

## 2021-08-22 NOTE — Telephone Encounter (Signed)
Requested medication (s) are due for refill today:   Yes  Requested medication (s) are on the active medication list:   Yes  Future visit scheduled:   No   Last ordered: 04/03/2021 #30, 3 refills  Returned because pt is requesting a dose increased.     Requested Prescriptions  Pending Prescriptions Disp Refills   venlafaxine XR (EFFEXOR-XR) 37.5 MG 24 hr capsule 30 capsule 3    Sig: Take 1 capsule (37.5 mg total) by mouth daily with breakfast.     Psychiatry: Antidepressants - SNRI - desvenlafaxine & venlafaxine Failed - 08/21/2021 11:38 AM      Failed - Lipid Panel in normal range within the last 12 months    Cholesterol, Total  Date Value Ref Range Status  04/03/2021 264 (H) 100 - 199 mg/dL Final   LDL Chol Calc (NIH)  Date Value Ref Range Status  04/03/2021 183 (H) 0 - 99 mg/dL Final   HDL  Date Value Ref Range Status  04/03/2021 55 >39 mg/dL Final   Triglycerides  Date Value Ref Range Status  04/03/2021 144 0 - 149 mg/dL Final         Passed - Cr in normal range and within 360 days    Creatinine  Date Value Ref Range Status  10/23/2015 0.9 0.6 - 1.1 mg/dL Final   Creatinine, Ser  Date Value Ref Range Status  04/03/2021 0.82 0.57 - 1.00 mg/dL Final         Passed - Completed PHQ-2 or PHQ-9 in the last 360 days      Passed - Last BP in normal range    BP Readings from Last 1 Encounters:  07/17/21 119/63         Passed - Valid encounter within last 6 months    Recent Outpatient Visits           1 month ago Essential hypertension   [place], Deborah B, MD   3 months ago Essential hypertension   [place], Stephen L, RPH-CPP   4 months ago Establishing care with new doctor, encounter for   [place] Ladell Pier, MD

## 2021-09-28 ENCOUNTER — Other Ambulatory Visit: Payer: Self-pay | Admitting: Internal Medicine

## 2021-09-30 NOTE — Telephone Encounter (Signed)
Rx 04/03/21 #30 6RF- too soon Requested Prescriptions  Pending Prescriptions Disp Refills  . lisinopril-hydrochlorothiazide (ZESTORETIC) 10-12.5 MG tablet [Pharmacy Med Name: LISINOPRIL-HCTZ 10-12.5 MG TAB] 90 tablet 2    Sig: TAKE 1 TABLET BY MOUTH EVERY DAY     Cardiovascular:  ACEI + Diuretic Combos Passed - 09/28/2021 12:14 AM      Passed - Na in normal range and within 180 days    Sodium  Date Value Ref Range Status  04/03/2021 142 134 - 144 mmol/L Final  10/23/2015 141 136 - 145 mEq/L Final         Passed - K in normal range and within 180 days    Potassium  Date Value Ref Range Status  04/03/2021 4.1 3.5 - 5.2 mmol/L Final  10/23/2015 4.0 3.5 - 5.1 mEq/L Final         Passed - Cr in normal range and within 180 days    Creatinine  Date Value Ref Range Status  10/23/2015 0.9 0.6 - 1.1 mg/dL Final   Creatinine, Ser  Date Value Ref Range Status  04/03/2021 0.82 0.57 - 1.00 mg/dL Final         Passed - eGFR is 30 or above and within 180 days    GFR calc Af Amer  Date Value Ref Range Status  01/10/2014 >90 >90 mL/min Final    Comment:    (NOTE) The eGFR has been calculated using the CKD EPI equation. This calculation has not been validated in all clinical situations. eGFR's persistently <90 mL/min signify possible Chronic Kidney Disease.    GFR calc non Af Amer  Date Value Ref Range Status  01/10/2014 83 (L) >90 mL/min Final   eGFR  Date Value Ref Range Status  04/03/2021 82 >59 mL/min/1.73 Final         Passed - Patient is not pregnant      Passed - Last BP in normal range    BP Readings from Last 1 Encounters:  07/17/21 119/63         Passed - Valid encounter within last 6 months    Recent Outpatient Visits          2 months ago Essential hypertension   [place], MD   5 months ago Essential hypertension   Merrill, Jarome Matin, RPH-CPP   6 months ago  Establishing care with new doctor, encounter for   [place] Ladell Pier, MD

## 2021-10-08 ENCOUNTER — Ambulatory Visit: Payer: Self-pay | Admitting: Internal Medicine

## 2021-10-14 DIAGNOSIS — M67432 Ganglion, left wrist: Secondary | ICD-10-CM | POA: Insufficient documentation

## 2021-10-17 ENCOUNTER — Ambulatory Visit: Payer: Self-pay | Admitting: *Deleted

## 2021-10-17 NOTE — Telephone Encounter (Signed)
  Chief Complaint: chest pain and left arm and shoulder blade pain.   Seen in ED yesterday at [facility] ED.   Left before being told what was wrong with her. Symptoms: Having chest pain this morning and left shoulder pain but not as bad as yesterday.   No cardiac history. Frequency: Since yesterday at 1:00 when it hit her all of a sudden at work.  She also had a bad headache when this happened.    EMS took her to [facility] ED. Pertinent Negatives: Patient denies breaking out in a sweat, no shortness of breath or dizziness. Disposition: '[x]'$ ED /'[]'$ Urgent Care (no appt availability in office) / '[]'$ Appointment(In office/virtual)/ '[]'$  [facility]/ '[]'$ Home Care/ '[]'$ Refused Recommended Disposition /'[]'$ [facility]/ '[]'$  Follow-up with PCP Additional Notes: Protocol is to go to the ED however since she has already been she is wanting to know if she can be seen for a follow up by Dr. Wynetta Emery.    Message sent to [facility], flow coordinator.    I did refer her on to the ED.   She was agreeable to going.

## 2021-10-17 NOTE — Telephone Encounter (Signed)
Reason for Disposition  Pain also in shoulder(s) or arm(s) or jaw  (Exception: Pain is clearly made worse by movement.)    Already seen in the ED yesterday at [facility].  Answer Assessment - Initial Assessment Questions 1. LOCATION: "Where does it hurt?"       I'm still having chest pains.   My arm is aching.   Left arm and shoulder blade is achy.   2. RADIATION: "Does the pain go anywhere else?" (e.g., into neck, jaw, arms, back)     Left arm and shoulder blade 3. ONSET: "When did the chest pain begin?" (Minutes, hours or days)      Yesterday at work.   EMS took me to [facility] ED.   In ambulance they told me it did not look like a heart attack.   They did EKG and blood work and x ray and I don't know the results.   By 9:00 PM I left because because I was still waiting.   My BP was high in the hospital yesterday.   I went to ortho dr on Monday and it was 150/93.    4. PATTERN: "Does the pain come and go, or has it been constant since it started?"  "Does it get worse with exertion?"      I'm so tired.   I've been sleeping since I got home last night.   I did eat a salad and drink some water.    I woke up to call y'all.     I don't have an appetite. The chest pain started at work at 1:00.   I tried to eat and felt bad.   I went to the nurse and told her what was happening.   My BP was elevated.   The nurse mentioned I was not acting like my usual self.   It hit me all of a sudden. 5. DURATION: "How long does it last" (e.g., seconds, minutes, hours)     I'm still having discomfort this morning but not as bad as yesterday.   I had a hard time getting to sleep last night due to the discomfort.   The pain is in the middle of my chest and especially with a deep breath.    No coughing or chest congestion. 6. SEVERITY: "How bad is the pain?"  (e.g., Scale 1-10; mild, moderate, or severe)    - MILD (1-3): doesn't interfere with normal activities     - MODERATE (4-7): interferes with normal activities  or awakens from sleep    - SEVERE (8-10): excruciating pain, unable to do any normal activities       3/10 now.   Yesterday it was 9/10.     When I tried to lay down to sleep I had to prop up to sleep for a while before I could lay down.   I'm having to breath deep.   I feel the discomfort when I breath deep.   It came on me all of a sudden yesterday.    7. CARDIAC RISK FACTORS: "Do you have any history of heart problems or risk factors for heart disease?" (e.g., angina, prior heart attack; diabetes, high blood pressure, high cholesterol, smoker, or strong family history of heart disease)     No cardiac history  I take BP medicine.   I just got tired of waiting in the ED.  No information in MyChart from the visit.    8. PULMONARY RISK FACTORS: "Do you have  any history of lung disease?"  (e.g., blood clots in lung, asthma, emphysema, birth control pills)     No lung history 9. CAUSE: "What do you think is causing the chest pain?"     I don't know.   It just hit me all of a sudden yesterday.    I've been having acid reflux.    I'm still taking the Prilosec OTC. 10. OTHER SYMPTOMS: "Do you have any other symptoms?" (e.g., dizziness, nausea, vomiting, sweating, fever, difficulty breathing, cough)       When it happened I had a bad headache.   No dizziness.    11. PREGNANCY: "Is there any chance you are pregnant?" "When was your last menstrual period?"       N/A  Protocols used: Chest Pain-A-AH

## 2021-10-17 NOTE — Telephone Encounter (Signed)
Routing to CMA 

## 2021-10-18 ENCOUNTER — Telehealth: Payer: Self-pay | Admitting: Internal Medicine

## 2021-10-18 ENCOUNTER — Telehealth: Payer: Self-pay | Admitting: Emergency Medicine

## 2021-10-18 NOTE — Telephone Encounter (Signed)
Pt is needing letter to return to work.

## 2021-10-18 NOTE — Telephone Encounter (Signed)
error 

## 2021-10-18 NOTE — Telephone Encounter (Signed)
Patient called, she says she did not stay at the ED on Wednesday because she would've been sicker. Yesterday when the nurse told her to go back to the ED, she decided not to go due to it would cost money. She says she went home and rested, got some sleep and she has no more chest pain. 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 I advised no appointments that I can schedule for next week. She says her job is on the line and if she's not seen at least by Monday, she may not have a job and she's not getting paid for being out of work. I advised I will send this to Dr. Wynetta Emery and someone will call back with her recommendation. Patient verbalized understanding.

## 2021-10-18 NOTE — Telephone Encounter (Signed)
Noted provider has already been sent a message.

## 2021-10-18 NOTE — Telephone Encounter (Signed)
Pt stated she was sent to [facility] for evaluation for chest pain but it took too long so she left. Pt stated she called in and spoke with a nurse and was advised to go back to the ED but she decided not to go. Pt reports now her employer is requesting that she get clearance from pcp before returning back to work. Attempted to get pt scheduled for an appt but there was no appt with Dr. Wynetta Emery until December. Pt requests call back for sooner appt for clearance to return back to work. Cb# [phone]

## 2021-10-21 ENCOUNTER — Encounter (HOSPITAL_BASED_OUTPATIENT_CLINIC_OR_DEPARTMENT_OTHER): Payer: Self-pay | Admitting: Emergency Medicine

## 2021-10-21 ENCOUNTER — Emergency Department (HOSPITAL_BASED_OUTPATIENT_CLINIC_OR_DEPARTMENT_OTHER)
Admission: EM | Admit: 2021-10-21 | Discharge: 2021-10-21 | Disposition: A | Discharge status: Home / Self Care | Payer: 59 | Attending: Emergency Medicine | Admitting: Emergency Medicine

## 2021-10-21 ENCOUNTER — Emergency Department (EMERGENCY_DEPARTMENT_HOSPITAL): Payer: 59

## 2021-10-21 ENCOUNTER — Encounter (HOSPITAL_COMMUNITY): Payer: Self-pay

## 2021-10-21 ENCOUNTER — Other Ambulatory Visit: Payer: Self-pay

## 2021-10-21 DIAGNOSIS — R079 Chest pain, unspecified: Secondary | ICD-10-CM

## 2021-10-21 DIAGNOSIS — R0602 Shortness of breath: Secondary | ICD-10-CM | POA: Insufficient documentation

## 2021-10-21 DIAGNOSIS — R9431 Abnormal electrocardiogram [ECG] [EKG]: Secondary | ICD-10-CM

## 2021-10-21 DIAGNOSIS — Z79899 Other long term (current) drug therapy: Secondary | ICD-10-CM | POA: Insufficient documentation

## 2021-10-21 DIAGNOSIS — R0789 Other chest pain: Secondary | ICD-10-CM | POA: Insufficient documentation

## 2021-10-21 DIAGNOSIS — I10 Essential (primary) hypertension: Secondary | ICD-10-CM | POA: Insufficient documentation

## 2021-10-21 LAB — CBC
HCT: 35.3 % — ABNORMAL LOW (ref 36.0–46.0)
Hemoglobin: 11.5 g/dL — ABNORMAL LOW (ref 12.0–15.0)
MCH: 28 pg (ref 26.0–34.0)
MCHC: 32.6 g/dL (ref 30.0–36.0)
MCV: 86.1 fL (ref 80.0–100.0)
Platelets: 361 10*3/uL (ref 150–400)
RBC: 4.1 MIL/uL (ref 3.87–5.11)
RDW: 13.3 % (ref 11.5–15.5)
WBC: 3.9 10*3/uL — ABNORMAL LOW (ref 4.0–10.5)
nRBC: 0 % (ref 0.0–0.2)

## 2021-10-21 LAB — BASIC METABOLIC PANEL
Anion gap: 8 (ref 5–15)
BUN: 13 mg/dL (ref 6–20)
CO2: 29 mmol/L (ref 22–32)
Calcium: 9.7 mg/dL (ref 8.9–10.3)
Chloride: 102 mmol/L (ref 98–111)
Creatinine, Ser: 0.98 mg/dL (ref 0.44–1.00)
GFR, Estimated: >60 mL/min (ref >60)
Glucose, Bld: 100 mg/dL — ABNORMAL HIGH (ref 70–99)
Potassium: 3.6 mmol/L (ref 3.5–5.1)
Sodium: 139 mmol/L (ref 135–145)

## 2021-10-21 LAB — TROPONIN I (HIGH SENSITIVITY)
Troponin I (High Sensitivity): 2 ng/L (ref <18)
Troponin I (High Sensitivity): 3 ng/L (ref <18)

## 2021-10-21 MED ORDER — NITROGLYCERIN 0.4 MG SL SUBL: 0.8000 mg | SUBLINGUAL_TABLET | Freq: Once | SUBLINGUAL | Status: AC

## 2021-10-21 MED ORDER — NITROGLYCERIN 0.4 MG SL SUBL
SUBLINGUAL_TABLET | SUBLINGUAL | Status: AC
  Administered 2021-10-21: 0.8 mg via SUBLINGUAL
  Filled 2021-10-21: qty 2

## 2021-10-21 MED ORDER — IOHEXOL 350 MG/ML SOLN
100.0000 mL | Freq: Once | INTRAVENOUS | Status: AC | PRN
  Administered 2021-10-21: 100 mL via INTRAVENOUS

## 2021-10-21 NOTE — ED Provider Notes (Signed)
[facility] EMERGENCY DEPT Provider Note   CSN: [id number] Arrival date & time: 10/21/21  1005     History  Chief Complaint  Patient presents with  . Chest Pain    [name] is a 61 y.o. female.  61 year old female with a history of hyperlipidemia and hyperlipidemia presents emergency department with chest discomfort.  Patient states that on 9/13 she started experiencing chest discomfort.  Described as a burning sensation that was also pressure-like and worsened with exertion.  Says that she also had shortness of breath.  Reports that she went to [facility] and had labs, EKG, and chest x-ray drawn that revealed a troponin that was undetectably low and chest x-ray that did not reveal acute findings.  Says that she went home due to wait times without being seen by her provider and then was told by her work that she needed to be cleared for her job today so comes in for evaluation.  States that her chest pain continued that night but resolved the next morning.  Denies any chest pain since.  Denies any diaphoresis or vomiting with her chest pain.  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  States that her father had an MI at the age of 24 and brother had an MI at the age of 11.  Had a stress test in 2015 that she believes was normal.         Home Medications Prior to Admission medications   Medication Sig Start Date End Date Taking? Authorizing Provider  ALPRAZolam (XANAX) 0.5 MG tablet 1 tab 1/2-1 hr prior to procedure.  May repeat x 1 if neeed 07/04/21   Ladell Pier, MD  atorvastatin (LIPITOR) 10 MG tablet Take 1 tablet (10 mg total) by mouth daily. 04/18/21   Ladell Pier, MD  Cholecalciferol (VITAMIN D3 PO) Take by mouth daily. 2000 IU    [provider]  Cyanocobalamin (VITAMIN B-12 SL)     [provider]  lisinopril-hydrochlorothiazide (ZESTORETIC) 10-12.5 MG tablet Take 1 tablet by mouth daily. 04/03/21    Ladell Pier, MD  Multiple Vitamin (MULTIVITAMIN PO) Take by mouth daily. CENTRUM    [provider]  venlafaxine XR (EFFEXOR-XR) 37.5 MG 24 hr capsule Take 1 capsule (37.5 mg total) by mouth daily with breakfast. 08/22/21   Ladell Pier, MD      Allergies    Hydrocodone    Review of Systems   Review of Systems  Physical Exam Updated Vital Signs BP 106/71 (BP Location: Left Arm)   Pulse 73   Temp 98 F (36.7 C) (Oral)   Resp 17   Ht '5\' 7"'$  (1.702 m)   Wt 87.1 kg   LMP 11/03/2008   SpO2 100%   BMI 30.07 kg/m  Physical Exam Vitals and nursing note reviewed.  Constitutional:      General: She is not in acute distress.    Appearance: She is well-developed.  HENT:     Head: Normocephalic and atraumatic.     Right Ear: External ear normal.     Left Ear: External ear normal.     Nose: Nose normal.     Mouth/Throat:     Mouth: Mucous membranes are moist.     Pharynx: Oropharynx is clear.  Eyes:     Extraocular Movements: Extraocular movements intact.     Conjunctiva/sclera: Conjunctivae normal.     Pupils: Pupils are equal, round, and reactive to  light.  Cardiovascular:     Rate and Rhythm: Normal rate and regular rhythm.     Heart sounds: No murmur heard. Pulmonary:     Effort: Pulmonary effort is normal. No respiratory distress.     Breath sounds: Normal breath sounds.  Abdominal:     General: Abdomen is flat. There is no distension.     Palpations: Abdomen is soft. There is no mass.     Tenderness: There is no abdominal tenderness. There is no guarding.  Musculoskeletal:        General: No swelling.     Cervical back: Normal range of motion and neck supple.     Right lower leg: No edema.     Left lower leg: No edema.  Skin:    General: Skin is warm and dry.     Capillary Refill: Capillary refill takes less than 2 seconds.  Neurological:     Mental Status: She is alert and oriented to person, place, and time. Mental status is at baseline.   Psychiatric:        Mood and Affect: Mood normal.     ED Results / Procedures / Treatments   Labs (all labs ordered are listed, but only abnormal results are displayed) Labs Reviewed  BASIC METABOLIC PANEL - Abnormal; Notable for the following components:      Result Value   Glucose, Bld 100 (*)    All other components within normal limits  CBC - Abnormal; Notable for the following components:   WBC 3.9 (*)    Hemoglobin 11.5 (*)    HCT 35.3 (*)    All other components within normal limits  TROPONIN I (HIGH SENSITIVITY)  TROPONIN I (HIGH SENSITIVITY)    EKG EKG Interpretation  Date/Time:  Monday October 21 2021 10:48:34 EDT Ventricular Rate:  81 PR Interval:  175 QRS Duration: 92 QT Interval:  383 QTC Calculation: 445 R Axis:   7 Text Interpretation: Sinus rhythm Nonspecific T abnormalities, anterior leads When compared to 12/23/2013 TW changes in V3 and V4 more pronounced. Reconfirmed by Margaretmary Eddy [phone]) on 10/21/2021 11:04:26 AM  Radiology CT CORONARY MORPH W/CTA COR W/SCORE W/CA W/CM &/OR WO/CM  Result Date: 10/21/2021 CLINICAL DATA:  29F presents with chest pain EXAM: Cardiac/Coronary CTA TECHNIQUE: The patient was scanned on a [employer]. FINDINGS: A 100 kV prospective scan was triggered in the descending thoracic aorta at 111 HU's. Axial non-contrast 3 mm slices were carried out through the heart. The data set was analyzed on a dedicated work station and scored using the [place]. Gantry rotation speed was 250 msecs and collimation was .6 mm. 0.8 mg of sl NTG was given. The 3D data set was reconstructed in 5% intervals of the 35-75 % of the R-R cycle. Phases were analyzed on a dedicated work station using MPR, MIP and VRT modes. The patient received 100 cc of contrast. Coronary Arteries:  Normal coronary origin.  Right dominance. RCA is a large dominant artery that gives rise to PDA and PLA. Noncalcified plaque in the proximal RCA causes mild  (25-49%) stenosis. Left main is a large artery that gives rise to LAD and LCX arteries. LAD is a large vessel that has no plaque. LCX is a non-dominant artery that gives rise to one large OM1 branch. There is no plaque. Other findings: Left Ventricle: Normal size Left Atrium: Normal size Pulmonary Veins: Normal configuration Right Ventricle: Normal size Right Atrium: Normal size Cardiac valves: No calcifications Thoracic aorta:  Normal size Pulmonary Arteries: Normal size Systemic Veins: Normal drainage Pericardium: Normal thickness IMPRESSION: 1. Coronary calcium score of 0. 2. Normal coronary origin with right dominance. 3. 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 Mild non-obstructive CAD (25-49%). Consider non-atherosclerotic causes of chest pain. Consider preventive therapy and risk factor modification. Electronically Signed   By: Oswaldo Milian M.D.   On: 10/21/2021 16:31    Procedures Procedures   Medications Ordered in ED Medications  nitroGLYCERIN (NITROSTAT) SL tablet 0.8 mg (0.8 mg Sublingual Given 10/21/21 1538)  iohexol (OMNIPAQUE) 350 MG/ML injection 100 mL (100 mLs Intravenous Contrast Given 10/21/21 1623)    ED Course/ Medical Decision Making/ A&P Clinical Course as of 10/21/21 1640  Mon Oct 21, 2021  1102 HEART Score of 4 given negative troponin at [facility].  [RP]  1352 Dr Lorin Mercy has reviewed case. Recommends discussing with cardiology to see if they are able to workup as an outpatient in the next few days.  [RP]  75 Spoke with Dr Sallyanne Kuster from cardiology who agrees that patient needs evaluation for her CP. Feels that coronary CTA will be more beneficial than stress test so will arrange for [facility] at [facility]. Will need 18g IV prior. Does not feel that she needs metoprolol at this time but would prefer HR be <65 BPM.  [RP]  1423 Spoke with Dr Alvino Chapel from the ED who accepts the patient.   [RP]  1639 Dr Sallyanne Kuster has reviewed the patient's coronary CTA and does not feel that the patient's symptoms are cardiac.  [RP]    Clinical Course User Index [RP] Fransico Meadow, MD                           Medical Decision Making Amount and/or Complexity of Data Reviewed Labs: ordered. Radiology: ordered.   Jean [name] is a 61 y.o. female with history of hypertension, hyperlipidemia, and family history of MI who presents with chief complaint of chest pain.  Initial Ddx:  MI, PE, pneumonia, MSK pain  MDM:  Initially was concerned about possible MI given the patient's family history and risk factors with exertional chest pain and shortness of breath.  Considered PE but no prior history and would be unusual for it to spontaneously resolve like her symptoms did.  No infectious symptoms to suggest pneumonia.  Chest pain does not appear to be associated with any injury that would suggest MSK pain.  Plan:  Labs Troponin EKG Chest x-ray considered but not ordered due to recent chest x-ray [facility]  ED Summary:  Patient's EKG did show new ischemic changes with ST depressions in the precordial leads. Initial troponin WNL.  Patient remained asymptomatic in the emergency department but given her family history and risk factors do feel that the patient needs expeditious work-up of her chest pain.  Discussed with the on-call cardiologist who recommended coronary CTA and did not feel that exercise stress testing was necessarily warranted at this time.  Patient was then transferred to [facility] for coronary CTA via private vehicle for further testing.  Dispo: Transfer to [facility] ED   Records reviewed Care Everywhere The following labs were independently interpreted: Serial Troponins Consults: Cardiology  Final Clinical Impression(s) / ED Diagnoses Final diagnoses:  Chest pain, unspecified type  Abnormal EKG    Rx / DC Orders ED Discharge Orders     None  Fransico Meadow, MD 10/21/21 [phone]

## 2021-10-21 NOTE — Discharge Instructions (Signed)
Follow-up closely with cardiology in the clinic likely later this week, they will call you for appointment.  Group is [facility].  Return for new concerns.

## 2021-10-21 NOTE — Telephone Encounter (Signed)
Pt was seen in Ed today

## 2021-10-21 NOTE — ED Provider Notes (Signed)
Patient was transferred [place] to ER for coronary CT scan.  Discussed with Dr. Loletha Grayer cardiology who reviewed results with myself which were unremarkable.  Patient stable for outpatient follow-up with cardiology in the clinic.  Patient well-appearing on exam, vital signs reassuring mild elevated blood pressure.  Patient comfortable this plan. Troponin negative.  Labs Reviewed  BASIC METABOLIC PANEL - Abnormal; Notable for the following components:      Result Value   Glucose, Bld 100 (*)    All other components within normal limits  CBC - Abnormal; Notable for the following components:   WBC 3.9 (*)    Hemoglobin 11.5 (*)    HCT 35.3 (*)    All other components within normal limits  TROPONIN I (HIGH SENSITIVITY)  TROPONIN I (HIGH SENSITIVITY)   Golda Acre, MD 10/21/21 [phone]

## 2021-10-21 NOTE — ED Triage Notes (Signed)
Pt arrived POV. Pt caox4 and ambulatory. Pt reports she went to the ED last Wednesday for CP and headache and got blood work done but left before seeing the doctor. Pt reports she stayed out of work Thursday due to still having CP so she spoke with her PCP Friday because her job requires medical clearance to return to work. PCP recommended pt be seen in the ED. Pt denies having CP, SOB, N/V, or any s/s over the weekend and denies today as well.

## 2021-10-21 NOTE — Progress Notes (Signed)
Post NTG administration.

## 2021-10-21 NOTE — Progress Notes (Signed)
Plan of Care Note for deferred transfer   Patient: Jean [name] MRN: [id number]   [place]: 10/21/2021  Facility requesting transfer: [facility] Requesting Provider: Philip Aspen Reason for transfer: Medical clearance  Facility course: Patient with h/o breast cancer (2015), HTN, and HLD presenting for medical clearance.  She was seen at the ER on 9/13 for chest pain and left AMA; troponin and BNP were normal.  She returned today for medical clearance, as required by her work.  Has strong FH of CAD, brother in 18s and dad in 107s.  CP last week, went to [facility] for evaluation.  She was triaged and left AMA.  CP resolved spontaneously.  She has been resting to avoid exertion.  No further CP.  Labs reassuring.  EKG with some mild ST depression, slightly worse than prior.  Heart score is 4.  She prefers inpatient evaluation if possible.  Based on no further symptoms since last week with negative troponin then and now, it may be reasonable to have cardiology do ASAP outpatient evaluation instead of waiting for transfer.  I have asked that he consult cardiology.  If they are ok with outpatient evaluation, will defer admission.  If they suggest inpatient evaluation, patient will be accepted for cardiac tele - obs.    After discussion with cardiology, she is being sent to [facility] for a coronary CTA.  Based on the result of that evaluation, she may be re-paged for admission at that time.     Author: Karmen Bongo, MD 10/21/2021  Check [email or web address] for on-call coverage.  Nursing staff, Please call [place] number on Amion as soon as patient's arrival, so appropriate admitting provider can evaluate the pt.

## 2021-10-28 ENCOUNTER — Other Ambulatory Visit: Payer: Self-pay | Admitting: Internal Medicine

## 2021-10-28 NOTE — Telephone Encounter (Signed)
Medication Refill - Medication: lisinopril-hydrochlorothiazide (ZESTORETIC) 10-12.5 MG tablet  Has the patient contacted their pharmacy? Yes.    (Agent: If yes, when and what did the pharmacy advise?) Contact PCP office because several attempts were made and no response from PCP office.  Patient is now out of medication.   Preferred Pharmacy (with phone number or street name):   [id number] [place]:  [phone] Fax:  [phone]     Has the patient been seen for an appointment in the last year OR does the patient have an upcoming appointment? Yes.    Agent: Please be advised that RX refills may take up to 3 business days. We ask that you follow-up with your pharmacy.

## 2021-10-29 MED ORDER — LISINOPRIL-HYDROCHLOROTHIAZIDE 10-12.5 MG PO TABS: 1.0000 | ORAL_TABLET | Freq: Every day | ORAL | 3 refills | Status: DC

## 2021-10-29 NOTE — Telephone Encounter (Signed)
Requested Prescriptions  Pending Prescriptions Disp Refills  . lisinopril-hydrochlorothiazide (ZESTORETIC) 10-12.5 MG tablet 30 tablet 3    Sig: Take 1 tablet by mouth daily.     Cardiovascular:  ACEI + Diuretic Combos Passed - 10/28/2021 11:38 AM      Passed - Na in normal range and within 180 days    Sodium  Date Value Ref Range Status  10/21/2021 139 135 - 145 mmol/L Final  04/03/2021 142 134 - 144 mmol/L Final  10/23/2015 141 136 - 145 mEq/L Final         Passed - K in normal range and within 180 days    Potassium  Date Value Ref Range Status  10/21/2021 3.6 3.5 - 5.1 mmol/L Final  10/23/2015 4.0 3.5 - 5.1 mEq/L Final         Passed - Cr in normal range and within 180 days    Creatinine  Date Value Ref Range Status  10/23/2015 0.9 0.6 - 1.1 mg/dL Final   Creatinine, Ser  Date Value Ref Range Status  10/21/2021 0.98 0.44 - 1.00 mg/dL Final         Passed - eGFR is 30 or above and within 180 days    GFR calc Af Amer  Date Value Ref Range Status  01/10/2014 >90 >90 mL/min Final    Comment:    (NOTE) The eGFR has been calculated using the CKD EPI equation. This calculation has not been validated in all clinical situations. eGFR's persistently <90 mL/min signify possible Chronic Kidney Disease.    GFR, Estimated  Date Value Ref Range Status  10/21/2021 >60 >60 mL/min Final    Comment:    (NOTE) Calculated using the CKD-EPI Creatinine Equation (2021)    eGFR  Date Value Ref Range Status  04/03/2021 82 >59 mL/min/1.73 Final         Passed - Patient is not pregnant      Passed - Last BP in normal range    BP Readings from Last 1 Encounters:  10/21/21 106/71         Passed - Valid encounter within last 6 months    Recent Outpatient Visits          3 months ago Essential hypertension   [place], MD   6 months ago Essential hypertension   [place], Jarome Matin,  RPH-CPP   6 months ago Establishing care with new doctor, encounter for   [place], MD      Future Appointments            In 2 weeks Girtha Rm, NP-C [employer] at [place]   In 2 weeks [place], MD [place] [place]   In 2 months Wynetta Emery, Dalbert Batman, MD [place]

## 2021-11-12 ENCOUNTER — Ambulatory Visit: Payer: 59 | Admitting: Family Medicine

## 2021-11-12 ENCOUNTER — Ambulatory Visit: Payer: 59 | Admitting: Cardiovascular Disease

## 2021-11-14 ENCOUNTER — Ambulatory Visit: Payer: 59 | Attending: Internal Medicine | Admitting: Internal Medicine

## 2021-11-14 ENCOUNTER — Encounter: Payer: Self-pay | Admitting: Internal Medicine

## 2021-11-14 VITALS — BP 118/72 | HR 70 | Ht 67.0 in | Wt 198.2 lb

## 2021-11-14 DIAGNOSIS — R079 Chest pain, unspecified: Secondary | ICD-10-CM

## 2021-11-14 NOTE — Patient Instructions (Signed)
Medication Instructions:  None Ordered At This Time.  *If you need a refill on your cardiac medications before your next appointment, please call your pharmacy*  Follow-Up: At [facility], you and your health needs are our priority.  As part of our continuing mission to provide you with exceptional heart care, we have created designated Provider Care Teams.  These Care Teams include your primary Cardiologist (physician) and Advanced Practice Providers (APPs -  Physician Assistants and Nurse Practitioners) who all work together to provide you with the care you need, when you need it.  Your next appointment:   AS NEEDED   The format for your next appointment:   In Person  Provider:   Janina Mayo, MD

## 2021-11-14 NOTE — Progress Notes (Signed)
Cardiology Office Note:    Date:  11/14/2021   ID:  Jean [name], DOB 1960/08/18, MRN [id number]  PCP:  Ladell Pier, MD   [place] Providers Cardiologist:  None     Referring MD: Elnora Morrison, MD   Chief Complaint  Patient presents with   New Patient (Initial Visit)  Atypical CP  History of Present Illness:    Jean [name] is a 61 y.o. female with a hx of anxiety, L breast cancer  GERD, HTN, referral for CP . She noted CP at work. It was a stressful day. She was seen in the ED. Troponin was negative. ECG was normal.  10/21/2021 Coronary CT score of zero.  SPECT 11/26/2013- normal perfusion  Blood pressure is well controlled.  Father with heart dx- had CHF. Had MI at age 12 s/p PCI. Brother 4vCABG at 95 years old.   Former smoker  Air traffic controller in the past, had some weakness and SOB.   Cardio-Oncology Hx . Breast cancer: left breast cancer diagnosed in 7/15.  ER+/PR+/HER-2+.  She had left lumpectomy in 7/15 and is undergoing adjuvant chemo with Taxol/Herceptin to be followed by radiation and Herceptin every 3 wks x 1 year.   - Echo (8/15) with EF 55-60%, grade II diastolic dysfunction, GLS -18%, lateral s' 11.4.  GLS was stable during tx.   Past Medical History:  Diagnosis Date   Anemia    Anxiety    Arthritis    back   Breast cancer ([place]) 08/03/2013   left   Cataract    LEFT FORMING   Dental crown present    Depression    GERD (gastroesophageal reflux disease)    no current med.   Hyperlipidemia    Hypertension    Radiation 01/18/14-03/09/14   Left breast    Past Surgical History:  Procedure Laterality Date   ABDOMINAL HYSTERECTOMY  12/16/2007   FOOT SURGERY Right    Lumpectory Left  August 30, 2013   [facility] REMOVAL Left 01/19/2015   Procedure: MINOR REMOVAL PORT-A-CATH;  Surgeon: [name], MD;  Location: [place];  Service: General;  Laterality: Left;   PORTACATH PLACEMENT Right 09/26/2013   Procedure:  INSERTION PORT-A-CATH;  Surgeon: Shann Medal, MD;  Location: [facility];  Service: General;  Laterality: Right;    Current Medications: Current Meds  Medication Sig   atorvastatin (LIPITOR) 10 MG tablet Take 1 tablet (10 mg total) by mouth daily.   Cholecalciferol (VITAMIN D3 PO) Take by mouth daily. 2000 IU   Cyanocobalamin (VITAMIN B-12 SL)    docusate sodium (COLACE) 100 MG capsule Take 100 mg by mouth 2 (two) times daily.   HYDROcodone-acetaminophen (NORCO/VICODIN) 5-325 MG tablet Take 1 tablet by mouth every 4 (four) hours as needed.   lisinopril-hydrochlorothiazide (ZESTORETIC) 10-12.5 MG tablet Take 1 tablet by mouth daily.   Multiple Vitamin (MULTIVITAMIN PO) Take by mouth daily. CENTRUM   ondansetron (ZOFRAN) 4 MG tablet Take 4 mg by mouth every 8 (eight) hours as needed.   venlafaxine XR (EFFEXOR-XR) 37.5 MG 24 hr capsule Take 1 capsule (37.5 mg total) by mouth daily with breakfast.     Allergies:   Hydrocodone   Social History   Socioeconomic History   Marital status: Divorced    Spouse name: Not on file   Number of children: Not on file   Years of education: Not on file   Highest education level: Not on file  Occupational History   Not on file  Tobacco Use   Smoking status: Former    Packs/day: 0.25    Years: 2.50    Total pack years: 0.63    Types: Cigarettes    Quit date: 02/03/2011    Years since quitting: 10.7    Passive exposure: Never   Smokeless tobacco: Never  Vaping Use   Vaping Use: Never used  Substance and Sexual Activity   Alcohol use: Yes    Comment: ONCE A WEEK   Drug use: No   Sexual activity: Yes  Other Topics Concern   Not on file  Social History Narrative   Not on file   Social Determinants of Health   Financial Resource Strain: Not on file  Food Insecurity: Not on file  Transportation Needs: Not on file  Physical Activity: Not on file  Stress: Not on file  Social Connections: Not on file     Family History: The patient's  family history includes Breast cancer in her paternal aunt; Cervical cancer in her paternal aunt; Colon cancer in her paternal uncle; Diabetes in her mother; Heart attack in her father; Heart failure in her father; Hypertension in her father and mother; Prostate cancer in her paternal uncle. There is no history of Crohn's disease, Esophageal cancer, Rectal cancer, or Stomach cancer.  ROS:   Please see the history of present illness.     All other systems reviewed and are negative.  EKGs/Labs/Other Studies Reviewed:    The following studies were reviewed today:   EKG:  EKG is  ordered today.  The ekg ordered today demonstrates   11/14/2021- NSR, no ST changes  Recent Labs: 04/03/2021: ALT 13 10/21/2021: BUN 13; Creatinine, Ser 0.98; Hemoglobin 11.5; Platelets 361; Potassium 3.6; Sodium 139   Recent Lipid Panel    Component Value Date/Time   CHOL 264 (H) 04/03/2021 1523   TRIG 144 04/03/2021 1523   HDL 55 04/03/2021 1523   CHOLHDL 4.8 (H) 04/03/2021 1523   LDLCALC 183 (H) 04/03/2021 1523     Risk Assessment/Calculations:     Physical Exam:    VS:  Vitals:   11/14/21 1545  BP: 118/72  Pulse: 70  SpO2: 98%      LMP 11/03/2008     Wt Readings from Last 3 Encounters:  10/21/21 192 lb (87.1 kg)  07/17/21 196 lb 6.4 oz (89.1 kg)  07/04/21 193 lb (87.5 kg)     GEN:  Well nourished, well developed in no acute distress HEENT: Normal NECK: No JVD; No carotid bruits LYMPHATICS: No lymphadenopathy CARDIAC: RRR, no murmurs, rubs, gallops RESPIRATORY:  Clear to auscultation without rales, wheezing or rhonchi  ABDOMEN: Soft, non-tender, non-distended MUSCULOSKELETAL:  No edema; No deformity  SKIN: Warm and dry NEUROLOGIC:  Alert and oriented x 3 PSYCHIATRIC:  Normal affect   ASSESSMENT:    Non cardiac CP: she has coronary CT of zero. Her CP is non cardiac. We discussed anxiety is playing a role.  PLAN:    In order of problems listed above:  No changes            Medication Adjustments/Labs and Tests Ordered: Current medicines are reviewed at length with the patient today.  Concerns regarding medicines are outlined above.  No orders of the defined types were placed in this encounter.  No orders of the defined types were placed in this encounter.   There are no Patient Instructions on file for this visit.   Signed, Maisie Fus, MD  11/14/2021 3:46 PM  [place]

## 2021-11-18 ENCOUNTER — Other Ambulatory Visit: Payer: Self-pay | Admitting: Internal Medicine

## 2021-11-18 DIAGNOSIS — F411 Generalized anxiety disorder: Secondary | ICD-10-CM

## 2021-11-18 DIAGNOSIS — F320 Major depressive disorder, single episode, mild: Secondary | ICD-10-CM

## 2021-12-06 ENCOUNTER — Ambulatory Visit: Payer: 59 | Admitting: Cardiology

## 2021-12-19 ENCOUNTER — Ambulatory Visit: Payer: 59 | Admitting: Family Medicine

## 2021-12-19 ENCOUNTER — Encounter: Payer: Self-pay | Admitting: Family Medicine

## 2021-12-19 VITALS — BP 124/86 | HR 75 | Temp 97.6°F | Ht 67.0 in | Wt 194.0 lb

## 2021-12-19 DIAGNOSIS — F411 Generalized anxiety disorder: Secondary | ICD-10-CM

## 2021-12-19 DIAGNOSIS — Z8639 Personal history of other endocrine, nutritional and metabolic disease: Secondary | ICD-10-CM | POA: Insufficient documentation

## 2021-12-19 DIAGNOSIS — I10 Essential (primary) hypertension: Secondary | ICD-10-CM

## 2021-12-19 DIAGNOSIS — F3289 Other specified depressive episodes: Secondary | ICD-10-CM | POA: Insufficient documentation

## 2021-12-19 DIAGNOSIS — E7800 Pure hypercholesterolemia, unspecified: Secondary | ICD-10-CM | POA: Diagnosis not present

## 2021-12-19 DIAGNOSIS — Z23 Encounter for immunization: Secondary | ICD-10-CM

## 2021-12-19 DIAGNOSIS — K219 Gastro-esophageal reflux disease without esophagitis: Secondary | ICD-10-CM

## 2021-12-19 DIAGNOSIS — N951 Menopausal and female climacteric states: Secondary | ICD-10-CM | POA: Insufficient documentation

## 2021-12-19 LAB — COMPREHENSIVE METABOLIC PANEL
ALT: 22 U/L (ref 0–35)
AST: 27 U/L (ref 0–37)
Albumin: 4.4 g/dL (ref 3.5–5.2)
Alkaline Phosphatase: 67 U/L (ref 39–117)
BUN: 15 mg/dL (ref 6–23)
CO2: 31 mEq/L (ref 19–32)
Calcium: 9.2 mg/dL (ref 8.4–10.5)
Chloride: 102 mEq/L (ref 96–112)
Creatinine, Ser: 0.83 mg/dL (ref 0.40–1.20)
GFR: 76.7 mL/min (ref >60.00)
Glucose, Bld: 102 mg/dL — ABNORMAL HIGH (ref 70–99)
Potassium: 3.5 mEq/L (ref 3.5–5.1)
Sodium: 139 mEq/L (ref 135–145)
Total Bilirubin: 0.4 mg/dL (ref 0.2–1.2)
Total Protein: 7.6 g/dL (ref 6.0–8.3)

## 2021-12-19 LAB — LIPID PANEL
Cholesterol: 169 mg/dL (ref 0–200)
HDL: 47.1 mg/dL (ref >39.00)
LDL Cholesterol: 107 mg/dL — ABNORMAL HIGH (ref 0–99)
NonHDL: 121.86
Total CHOL/HDL Ratio: 4
Triglycerides: 72 mg/dL (ref 0.0–149.0)
VLDL: 14.4 mg/dL (ref 0.0–40.0)

## 2021-12-19 LAB — CBC WITH DIFFERENTIAL/PLATELET
Basophils Absolute: 0 10*3/uL (ref 0.0–0.1)
Basophils Relative: 0.9 % (ref 0.0–3.0)
Eosinophils Absolute: 0.1 10*3/uL (ref 0.0–0.7)
Eosinophils Relative: 1.5 % (ref 0.0–5.0)
HCT: 36.4 % (ref 36.0–46.0)
Hemoglobin: 12.1 g/dL (ref 12.0–15.0)
Lymphocytes Relative: 45.5 % (ref 12.0–46.0)
Lymphs Abs: 1.7 10*3/uL (ref 0.7–4.0)
MCHC: 33.1 g/dL (ref 30.0–36.0)
MCV: 84.6 fl (ref 78.0–100.0)
Monocytes Absolute: 0.2 10*3/uL (ref 0.1–1.0)
Monocytes Relative: 6.3 % (ref 3.0–12.0)
Neutro Abs: 1.7 10*3/uL (ref 1.4–7.7)
Neutrophils Relative %: 45.8 % (ref 43.0–77.0)
Platelets: 377 10*3/uL (ref 150.0–400.0)
RBC: 4.3 Mil/uL (ref 3.87–5.11)
RDW: 14.4 % (ref 11.5–15.5)
WBC: 3.7 10*3/uL — ABNORMAL LOW (ref 4.0–10.5)

## 2021-12-19 LAB — TSH: TSH: 0.51 u[IU]/mL (ref 0.35–5.50)

## 2021-12-19 LAB — HEMOGLOBIN A1C: Hgb A1c MFr Bld: 6.7 % — ABNORMAL HIGH (ref 4.6–6.5)

## 2021-12-19 MED ORDER — VENLAFAXINE HCL ER 75 MG PO CP24: 75.0000 mg | ORAL_CAPSULE | Freq: Every day | ORAL | 2 refills | Status: DC

## 2021-12-19 NOTE — Assessment & Plan Note (Addendum)
BP controlled. Continue current medications. Check renal function

## 2021-12-19 NOTE — Assessment & Plan Note (Signed)
Continue statin. Check lipids and follow up

## 2021-12-19 NOTE — Progress Notes (Signed)
New Patient Office Visit  Subjective    Patient ID: Jean [name], female    DOB: [birth year]  Age: 61 y.o. MRN: [id number]  CC:  Chief Complaint  Patient presents with   Establish Care    Last month had a scare due to her anxiety getting worse, she has been having lots of changes in life and is becoming more stressed.   Also wants rash looked at on right shin. It started as a small dot in September and seems to have gotten worse, is very itchy. Denies pain.     HPI Jean [name] presents to establish care  Anxiety is her biggest concern today.   She is not exercising which in the past was an outlet for her. States she is busy, has issues  setting boundaries and taking time for herself.    She has been taking Effexor 37.5 mg daily x 3 months.  She stopped it for a year.   Recent chest pain and saw her cardiology   Acid reflux  Works nights now. Eats before going to bed in the morning.   Denies fever, chills, night sweats, dizziness, chest pain, palpitations, shortness of breath, N/V/D, urinary symptoms, LE edema.    Outpatient Encounter Medications as of 12/19/2021  Medication Sig   atorvastatin (LIPITOR) 10 MG tablet Take 1 tablet (10 mg total) by mouth daily.   Cholecalciferol (VITAMIN D3 PO) Take by mouth daily. 2000 IU   Cyanocobalamin (VITAMIN B-12 SL)    lisinopril-hydrochlorothiazide (ZESTORETIC) 10-12.5 MG tablet Take 1 tablet by mouth daily.   Multiple Vitamin (MULTIVITAMIN PO) Take by mouth daily. CENTRUM   venlafaxine XR (EFFEXOR XR) 75 MG 24 hr capsule Take 1 capsule (75 mg total) by mouth daily with breakfast.   [DISCONTINUED] venlafaxine XR (EFFEXOR-XR) 37.5 MG 24 hr capsule TAKE 1 CAPSULE BY MOUTH DAILY WITH BREAKFAST.   [DISCONTINUED] ALPRAZolam (XANAX) 0.5 MG tablet 1 tab 1/2-1 hr prior to procedure.  May repeat x 1 if neeed (Patient not taking: Reported on 11/14/2021)   [DISCONTINUED] docusate sodium (COLACE) 100 MG capsule Take 100 mg by mouth 2 (two)  times daily.   [DISCONTINUED] HYDROcodone-acetaminophen (NORCO/VICODIN) 5-325 MG tablet Take 1 tablet by mouth every 4 (four) hours as needed. (Patient not taking: Reported on 12/19/2021)   [DISCONTINUED] ondansetron (ZOFRAN) 4 MG tablet Take 4 mg by mouth every 8 (eight) hours as needed.   No facility-administered encounter medications on file as of 12/19/2021.    Past Medical History:  Diagnosis Date   Anemia    Anxiety    Arthritis    back   Breast cancer ([place]) 08/03/2013   left   Cataract    LEFT FORMING   Dental crown present    Depression    GERD (gastroesophageal reflux disease)    no current med.   Hyperlipidemia    Hypertension    Radiation 01/18/14-03/09/14   Left breast    Past Surgical History:  Procedure Laterality Date   ABDOMINAL HYSTERECTOMY  12/16/2007   FOOT SURGERY Right    Lumpectory Left  August 30, 2013   [facility] REMOVAL Left 01/19/2015   Procedure: MINOR REMOVAL PORT-A-CATH;  Surgeon: [name], MD;  Location: [place];  Service: General;  Laterality: Left;   PORTACATH PLACEMENT Right 09/26/2013   Procedure: INSERTION PORT-A-CATH;  Surgeon: Shann Medal, MD;  [facility];  Service: General;  Laterality: Right;    Family History  Problem Relation Age of Onset  Hypertension Mother    Diabetes Mother    Hypertension Father    Heart failure Father    Heart attack Father    Breast cancer Paternal Aunt    Cervical cancer Paternal Aunt    Colon cancer Paternal Uncle    Prostate cancer Paternal Uncle    Crohn's disease Neg Hx    Esophageal cancer Neg Hx    Rectal cancer Neg Hx    Stomach cancer Neg Hx     Social History   Socioeconomic History   Marital status: Divorced    Spouse name: Not on file   Number of children: Not on file   Years of education: Not on file   Highest education level: Not on file  Occupational History   Not on file  Tobacco Use   Smoking status: Former    Packs/day: 0.25    Years: 2.50     Total pack years: 0.63    Types: Cigarettes    Quit date: 02/03/2011    Years since quitting: 10.8    Passive exposure: Never   Smokeless tobacco: Never  Vaping Use   Vaping Use: Never used  Substance and Sexual Activity   Alcohol use: Yes    Comment: ONCE A WEEK   Drug use: No   Sexual activity: Yes  Other Topics Concern   Not on file  Social History Narrative   Not on file   Social Determinants of Health   Financial Resource Strain: Not on file  Food Insecurity: Not on file  Transportation Needs: Not on file  Physical Activity: Not on file  Stress: Not on file  Social Connections: Not on file  Intimate Partner Violence: Not on file    ROS      Objective    BP 124/86 (BP Location: Left Arm, Patient Position: Sitting, Cuff Size: Large)   Pulse 75   Temp 97.6 F (36.4 C) (Temporal)   Ht '5\' 7"'$  (1.702 m)   Wt 194 lb (88 kg)   LMP 11/03/2008   SpO2 98%   BMI 30.38 kg/m   Physical Exam Constitutional:      General: She is not in acute distress.    Appearance: She is not ill-appearing.  Cardiovascular:     Rate and Rhythm: Normal rate and regular rhythm.  Pulmonary:     Effort: Pulmonary effort is normal.     Breath sounds: Normal breath sounds.  Musculoskeletal:     Right lower leg: No edema.     Left lower leg: No edema.  Neurological:     General: No focal deficit present.     Mental Status: She is alert and oriented to person, place, and time.  Psychiatric:        Mood and Affect: Mood normal.        Behavior: Behavior normal.        Thought Content: Thought content normal.         Assessment & Plan:   Problem List Items Addressed This Visit       Cardiovascular and Mediastinum   Essential hypertension - Primary    BP controlled. Continue current medications. Check renal function       Relevant Orders   CBC with Differential/Platelet (Completed)   Comprehensive metabolic panel (Completed)     Digestive   Gastroesophageal reflux  disease    Working nights now. Encouraged her to avoid eating and laying down. Avoid triggers. Take Pepcid prn.  Other   GAD (generalized anxiety disorder)    Increase Effexor XR to 75 mg. Encouraged her to schedule with a therapist. Discussed making time for herself with exercise, meditation, or whatever would bring her joy.       Relevant Medications   venlafaxine XR (EFFEXOR XR) 75 MG 24 hr capsule   History of diabetes mellitus    Check A1c      Relevant Orders   Hemoglobin A1c (Completed)   TSH (Completed)   Pure hypercholesterolemia    Continue statin. Check lipids and follow up      Relevant Orders   Lipid panel (Completed)   Other Visit Diagnoses     Need for influenza vaccination       Relevant Orders   Flu Vaccine QUAD 75moIM (Fluarix, Fluzone & Alfiuria Quad PF) (Completed)       Return in about 2 weeks (around 01/02/2022).   VHarland Dingwall NP-C

## 2021-12-19 NOTE — Assessment & Plan Note (Signed)
Working nights now. Encouraged her to avoid eating and laying down. Avoid triggers. Take Pepcid prn.

## 2021-12-19 NOTE — Assessment & Plan Note (Signed)
Check A1c. 

## 2021-12-19 NOTE — Assessment & Plan Note (Signed)
Increase Effexor XR to 75 mg. Encouraged her to schedule with a therapist. Discussed making time for herself with exercise, meditation, or whatever would bring her joy.

## 2021-12-19 NOTE — Patient Instructions (Addendum)
Thank you for trusting [place] with your health care.  Please go downstairs for labs before you leave today.  I sent in a new prescription for Effexor 75 mg once daily.  For acid reflux, avoid eating and laying down.  Avoid foods that trigger acid reflux.  You may want to take over-the-counter Pepcid for severe flares.  I recommend scheduling with a therapist.  Take time for you.  Try to get back into an exercise routine even if it is 10 minutes a day for now.  [facility]  [address], [place], [place] [address] Phone: [phone]    [facility]  [address] [place], [place], [place] [address]  Phone: [phone]   [place] [address] [place], [place] [address]  Phone: [phone]

## 2022-01-01 ENCOUNTER — Ambulatory Visit: Payer: 59 | Admitting: Family Medicine

## 2022-01-01 ENCOUNTER — Other Ambulatory Visit: Payer: Self-pay | Admitting: Internal Medicine

## 2022-01-01 ENCOUNTER — Encounter: Payer: Self-pay | Admitting: Family Medicine

## 2022-01-01 VITALS — BP 126/84 | HR 90 | Temp 97.6°F | Ht 67.0 in | Wt 194.0 lb

## 2022-01-01 DIAGNOSIS — E119 Type 2 diabetes mellitus without complications: Secondary | ICD-10-CM | POA: Diagnosis not present

## 2022-01-01 DIAGNOSIS — I10 Essential (primary) hypertension: Secondary | ICD-10-CM

## 2022-01-01 DIAGNOSIS — E7800 Pure hypercholesterolemia, unspecified: Secondary | ICD-10-CM | POA: Diagnosis not present

## 2022-01-01 DIAGNOSIS — Z23 Encounter for immunization: Secondary | ICD-10-CM | POA: Diagnosis not present

## 2022-01-01 LAB — MICROALBUMIN / CREATININE URINE RATIO
Creatinine,U: 176.9 mg/dL
Microalb Creat Ratio: 0.5 mg/g (ref 0.0–30.0)
Microalb, Ur: 0.9 mg/dL (ref 0.0–1.9)

## 2022-01-01 MED ORDER — METFORMIN HCL 500 MG PO TABS: 500.0000 mg | ORAL_TABLET | Freq: Every day | ORAL | 1 refills | Status: DC

## 2022-01-01 MED ORDER — ATORVASTATIN CALCIUM 20 MG PO TABS: 20.0000 mg | ORAL_TABLET | Freq: Every day | ORAL | 3 refills | Status: DC

## 2022-01-01 NOTE — Progress Notes (Signed)
Subjective:     Patient ID: Jean [name], female    DOB: 1960/12/09, 61 y.o.   MRN: [id number]  Chief Complaint  Patient presents with   Follow-up    2 week f/u    HPI Patient is in today for follow up on HTN, anxiety, hyperlipidemia and hemoglobin A1c of 6.7%.   Reports history of of prediabetes/diabetes. Parents with diabetes.   Her mood is fine.  We increased her dose of Effexor to 75 mg 2 weeks ago.  She drinks coffee with sugar and cream.  Typically does not drink any other sugary beverages.  She does eat a fair amount of carbohydrates.  No recent exercise.  She works night shift.  Persistent hematuria and she saw urology and told it was benign.   Denies fever, chills, dizziness, chest pain, palpitations, shortness of breath, abdominal pain, N/V/D, urinary symptoms, LE edema.     Health Maintenance Due  Topic Date Due   HIV Screening  Never done   Diabetic kidney evaluation - Urine ACR  Never done   Hepatitis C Screening  Never done   MAMMOGRAM  10/18/2017    Past Medical History:  Diagnosis Date   Anemia    Anxiety    Arthritis    back   Breast cancer ([place]) 08/03/2013   left   Cataract    LEFT FORMING   Dental crown present    Depression    GERD (gastroesophageal reflux disease)    no current med.   Hyperlipidemia    Hypertension    Radiation 01/18/14-03/09/14   Left breast    Past Surgical History:  Procedure Laterality Date   ABDOMINAL HYSTERECTOMY  12/16/2007   FOOT SURGERY Right    Lumpectory Left  August 30, 2013   [facility] REMOVAL Left 01/19/2015   Procedure: MINOR REMOVAL PORT-A-CATH;  Surgeon: [name], MD;  Location: [place];  Service: General;  Laterality: Left;   PORTACATH PLACEMENT Right 09/26/2013   Procedure: INSERTION PORT-A-CATH;  Surgeon: Shann Medal, MD;  Location: [facility];  Service: General;  Laterality: Right;    Family History  Problem Relation Age of Onset   Hypertension Mother    Diabetes Mother     Hypertension Father    Heart failure Father    Heart attack Father    Breast cancer Paternal Aunt    Cervical cancer Paternal Aunt    Colon cancer Paternal Uncle    Prostate cancer Paternal Uncle    Crohn's disease Neg Hx    Esophageal cancer Neg Hx    Rectal cancer Neg Hx    Stomach cancer Neg Hx     Social History   Socioeconomic History   Marital status: Divorced    Spouse name: Not on file   Number of children: Not on file   Years of education: Not on file   Highest education level: Not on file  Occupational History   Not on file  Tobacco Use   Smoking status: Former    Packs/day: 0.25    Years: 2.50    Total pack years: 0.63    Types: Cigarettes    Quit date: 02/03/2011    Years since quitting: 10.9    Passive exposure: Never   Smokeless tobacco: Never  Vaping Use   Vaping Use: Never used  Substance and Sexual Activity   Alcohol use: Yes    Comment: ONCE A WEEK   Drug use: No   Sexual activity: Yes  Other Topics Concern   Not on file  Social History Narrative   Not on file   Social Determinants of Health   Financial Resource Strain: Not on file  Food Insecurity: Not on file  Transportation Needs: Not on file  Physical Activity: Not on file  Stress: Not on file  Social Connections: Not on file  Intimate Partner Violence: Not on file    Outpatient Medications Prior to Visit  Medication Sig Dispense Refill   Cholecalciferol (VITAMIN D3 PO) Take by mouth daily. 2000 IU     Cyanocobalamin (VITAMIN B-12 SL)      lisinopril-hydrochlorothiazide (ZESTORETIC) 10-12.5 MG tablet Take 1 tablet by mouth daily. 30 tablet 3   Multiple Vitamin (MULTIVITAMIN PO) Take by mouth daily. CENTRUM     venlafaxine XR (EFFEXOR XR) 75 MG 24 hr capsule Take 1 capsule (75 mg total) by mouth daily with breakfast. 30 capsule 2   atorvastatin (LIPITOR) 10 MG tablet Take 1 tablet (10 mg total) by mouth daily. 90 tablet 3   No facility-administered medications prior to visit.     Allergies  Allergen Reactions   Hydrocodone Other (See Comments)    Makes very constipated     ROS     Objective:    Physical Exam  BP 126/84 (BP Location: Left Arm, Patient Position: Sitting, Cuff Size: Large)   Pulse 90   Temp 97.6 F (36.4 C) (Temporal)   Ht '5\' 7"'$  (1.702 m)   Wt 194 lb (88 kg)   LMP 11/03/2008   SpO2 96%   BMI 30.38 kg/m  Wt Readings from Last 3 Encounters:  01/01/22 194 lb (88 kg)  12/19/21 194 lb (88 kg)  11/14/21 198 lb 3.2 oz (89.9 kg)   Alert and oriented and in no acute distress.  Respirations unlabored.  Normal mood, speech and memory.    Assessment & Plan:   Problem List Items Addressed This Visit       Cardiovascular and Mediastinum   Essential hypertension   Relevant Medications   atorvastatin (LIPITOR) 20 MG tablet     Other   Pure hypercholesterolemia   Relevant Medications   atorvastatin (LIPITOR) 20 MG tablet   Other Visit Diagnoses     Type 2 diabetes mellitus without complication, without long-term current use of insulin (HCC)    -  Primary   Relevant Medications   metFORMIN (GLUCOPHAGE) 500 MG tablet   atorvastatin (LIPITOR) 20 MG tablet   Other Relevant Orders   Microalbumin / creatinine urine ratio   Need for pneumococcal 20-valent conjugate vaccination       Relevant Orders   Pneumococcal conjugate vaccine 20-valent (Prevnar 20) (Completed)      Blood pressure close to goal range.  Continue lisinopril-HCTZ 10-12.5 mg daily.  Mood is unchanged.  Increased Effexor to 75 mg 2 weeks ago. Diabetes with a hemoglobin A1c of 6.7%.  In-depth counseling on standards of care and lifestyle modifications to help improve blood sugars.  She is amenable to starting on metformin 500 mg with breakfast.  Reports eye exam is up-to-date. Urine microalbumin ordered. Prevnar 20 vaccine given. Handout was provided on carbohydrates for diabetes. LDL 107 and not in goal range.  Increase atorvastatin from 10 mg to 20 mg daily. She  will follow-up in 3 months or sooner if she has any questions or concerns.  Will recheck lipid panel as well as an A1c at that visit.  I have discontinued Keana Schumpert's atorvastatin. I am also having her  start on metFORMIN and atorvastatin. Additionally, I am having her maintain her Cyanocobalamin (VITAMIN B-12 SL), Multiple Vitamin (MULTIVITAMIN PO), Cholecalciferol (VITAMIN D3 PO), lisinopril-hydrochlorothiazide, and venlafaxine XR.  Meds ordered this encounter  Medications   metFORMIN (GLUCOPHAGE) 500 MG tablet    Sig: Take 1 tablet (500 mg total) by mouth daily with breakfast.    Dispense:  90 tablet    Refill:  1    Order Specific Question:   Supervising Provider    Answer:   Pricilla Holm A [[id number]]   atorvastatin (LIPITOR) 20 MG tablet    Sig: Take 1 tablet (20 mg total) by mouth daily.    Dispense:  90 tablet    Refill:  3    Order Specific Question:   Supervising Provider    Answer:   Pricilla Holm A [[id number]]

## 2022-01-01 NOTE — Patient Instructions (Addendum)
Please call and schedule your mammogram appointment.   Go downstairs for a urine protein check.   Your 6.7% and this is considered diabetes.  As discussed, start on metformin 500 mg with your first meal of the day.  Cut back on carbohydrates and sugar.  Try to get at least 150 minutes of physical activity each week.  I increased your atorvastatin (cholesterol medicine) to 20 mg daily.  Follow-up in 3 months fasting (nothing to eat or drink for at least 6 hours except do drink water)  Let me know if you have any questions about the medications   Carbohydrate Counting for Diabetes Mellitus, Adult Carbohydrate counting is a method of keeping track of how many carbohydrates you eat. Eating carbohydrates increases the amount of sugar (glucose) in the blood. Counting how many carbohydrates you eat improves how well you manage your blood glucose. This, in turn, helps you manage your diabetes. Carbohydrates are measured in grams (g) per serving. It is important to know how many carbohydrates (in grams or by serving size) you can have in each meal. This is different for every person. A dietitian can help you make a meal plan and calculate how many carbohydrates you should have at each meal and snack. What foods contain carbohydrates? Carbohydrates are found in the following foods: Grains, such as breads and cereals. Dried beans and soy products. Starchy vegetables, such as potatoes, peas, and corn. Fruit and fruit juices. Milk and yogurt. Sweets and snack foods, such as cake, cookies, candy, chips, and soft drinks. How do I count carbohydrates in foods? There are two ways to count carbohydrates in food. You can read food labels or learn standard serving sizes of foods. You can use either of these methods or a combination of both. Using the Nutrition Facts label The Nutrition Facts list is included on the labels of almost all packaged foods and beverages in the [place]. It includes: The  serving size. Information about nutrients in each serving, including the grams of carbohydrate per serving. To use the Nutrition Facts, decide how many servings you will have. Then, multiply the number of servings by the number of carbohydrates per serving. The resulting number is the total grams of carbohydrates that you will be having. Learning the standard serving sizes of foods When you eat carbohydrate foods that are not packaged or do not include Nutrition Facts on the label, you need to measure the servings in order to count the grams of carbohydrates. Measure the foods that you will eat with a food scale or measuring cup, if needed. Decide how many standard-size servings you will eat. Multiply the number of servings by 15. For foods that contain carbohydrates, one serving equals 15 g of carbohydrates. For example, if you eat 2 cups or 10 oz (300 g) of strawberries, you will have eaten 2 servings and 30 g of carbohydrates (2 servings x 15 g = 30 g). For foods that have more than one food mixed, such as soups and casseroles, you must count the carbohydrates in each food that is included. The following list contains standard serving sizes of common carbohydrate-rich foods. Each of these servings has about 15 g of carbohydrates: 1 slice of bread. 1 six-inch (15 cm) tortilla. ? cup or 2 oz (53 g) cooked rice or pasta.  cup or 3 oz (85 g) cooked or canned, drained and rinsed beans or lentils.  cup or 3 oz (85 g) starchy vegetable, such as peas, corn, or squash.  cup or 4 oz (120 g) hot cereal.  cup or 3 oz (85 g) boiled or mashed potatoes, or  or 3 oz (85 g) of a large baked potato.  cup or 4 fl oz (118 mL) fruit juice. 1 cup or 8 fl oz (237 mL) milk. 1 small or 4 oz (106 g) apple.  or 2 oz (63 g) of a medium banana. 1 cup or 5 oz (150 g) strawberries. 3 cups or 1 oz (28.3 g) popped popcorn. What is an example of carbohydrate counting? To calculate the grams of carbohydrates in this  sample meal, follow the steps shown below. Sample meal 3 oz (85 g) chicken breast. ? cup or 4 oz (106 g) brown rice.  cup or 3 oz (85 g) corn. 1 cup or 8 fl oz (237 mL) milk. 1 cup or 5 oz (150 g) strawberries with sugar-free whipped topping. Carbohydrate calculation Identify the foods that contain carbohydrates: Rice. Corn. Milk. Strawberries. Calculate how many servings you have of each food: 2 servings rice. 1 serving corn. 1 serving milk. 1 serving strawberries. Multiply each number of servings by 15 g: 2 servings rice x 15 g = 30 g. 1 serving corn x 15 g = 15 g. 1 serving milk x 15 g = 15 g. 1 serving strawberries x 15 g = 15 g. Add together all of the amounts to find the total grams of carbohydrates eaten: 30 g + 15 g + 15 g + 15 g = 75 g of carbohydrates total. What are tips for following this plan? Shopping Develop a meal plan and then make a shopping list. Buy fresh and frozen vegetables, fresh and frozen fruit, dairy, eggs, beans, lentils, and whole grains. Look at food labels. Choose foods that have more fiber and less sugar. Avoid processed foods and foods with added sugars. Meal planning Aim to have the same number of grams of carbohydrates at each meal and for each snack time. Plan to have regular, balanced meals and snacks. Where to find more information American Diabetes Association: diabetes.org Centers for Disease Control and Prevention: StoreMirror.com.[place] Academy of Nutrition and Dietetics: eatright.org Association of Diabetes Care & Education Specialists: diabeteseducator.org Summary Carbohydrate counting is a method of keeping track of how many carbohydrates you eat. Eating carbohydrates increases the amount of sugar (glucose) in your blood. Counting how many carbohydrates you eat improves how well you manage your blood glucose. This helps you manage your diabetes. A dietitian can help you make a meal plan and calculate how many carbohydrates you should have at  each meal and snack. This information is not intended to replace advice given to you by your health care provider. Make sure you discuss any questions you have with your health care provider. Document Revised: 08/24/2019 Document Reviewed: 08/24/2019 Elsevier Patient Education  [place].

## 2022-01-22 ENCOUNTER — Other Ambulatory Visit: Payer: Self-pay | Admitting: Family Medicine

## 2022-01-22 DIAGNOSIS — F411 Generalized anxiety disorder: Secondary | ICD-10-CM

## 2022-01-22 NOTE — Telephone Encounter (Signed)
LM for pt to return my call and see who she is using as PCP

## 2022-01-22 NOTE — Telephone Encounter (Signed)
Requesting 90 day rx instead of 30 days, is this ok?

## 2022-01-23 ENCOUNTER — Ambulatory Visit: Payer: 59 | Admitting: Internal Medicine

## 2022-01-23 NOTE — Telephone Encounter (Signed)
Patient had canceled that appt. She said she wants Vickie has her PCP

## 2022-02-12 ENCOUNTER — Encounter: Payer: Self-pay | Admitting: Family Medicine

## 2022-02-12 ENCOUNTER — Ambulatory Visit (INDEPENDENT_AMBULATORY_CARE_PROVIDER_SITE_OTHER): Payer: 59

## 2022-02-12 ENCOUNTER — Ambulatory Visit: Payer: 59 | Admitting: Family Medicine

## 2022-02-12 VITALS — BP 102/74 | HR 88 | Temp 97.6°F | Ht 67.0 in | Wt 194.0 lb

## 2022-02-12 DIAGNOSIS — R221 Localized swelling, mass and lump, neck: Secondary | ICD-10-CM

## 2022-02-12 DIAGNOSIS — I889 Nonspecific lymphadenitis, unspecified: Secondary | ICD-10-CM

## 2022-02-12 DIAGNOSIS — R1312 Dysphagia, oropharyngeal phase: Secondary | ICD-10-CM

## 2022-02-12 LAB — CBC WITH DIFFERENTIAL/PLATELET
Basophils Absolute: 0 10*3/uL (ref 0.0–0.1)
Basophils Relative: 0.4 % (ref 0.0–3.0)
Eosinophils Absolute: 0.1 10*3/uL (ref 0.0–0.7)
Eosinophils Relative: 1.9 % (ref 0.0–5.0)
HCT: 36.3 % (ref 36.0–46.0)
Hemoglobin: 12.3 g/dL (ref 12.0–15.0)
Lymphocytes Relative: 17.6 % (ref 12.0–46.0)
Lymphs Abs: 1.4 10*3/uL (ref 0.7–4.0)
MCHC: 33.9 g/dL (ref 30.0–36.0)
MCV: 83.3 fl (ref 78.0–100.0)
Monocytes Absolute: 0.5 10*3/uL (ref 0.1–1.0)
Monocytes Relative: 5.9 % (ref 3.0–12.0)
Neutro Abs: 5.9 10*3/uL (ref 1.4–7.7)
Neutrophils Relative %: 74.2 % (ref 43.0–77.0)
Platelets: 379 10*3/uL (ref 150.0–400.0)
RBC: 4.36 Mil/uL (ref 3.87–5.11)
RDW: 14.2 % (ref 11.5–15.5)
WBC: 7.9 10*3/uL (ref 4.0–10.5)

## 2022-02-12 LAB — C-REACTIVE PROTEIN: CRP: 10.8 mg/dL (ref 0.5–20.0)

## 2022-02-12 MED ORDER — AMOXICILLIN-POT CLAVULANATE 875-125 MG PO TABS: 1.0000 | ORAL_TABLET | Freq: Two times a day (BID) | ORAL | 0 refills | Status: DC

## 2022-02-12 NOTE — Progress Notes (Signed)
Subjective:     Patient ID: Jean [name], female    DOB: 09/02/1960, 62 y.o.   MRN: [id number]  Chief Complaint  Patient presents with   Mass    Swelling under chin, tender to the touch. Started on right jaw area and seems to have radiated to under her chin. Noticed yesterday around 10:30 am    HPI Patient is in today for a 24 hr hx of swelling and tenderness to the right side of her neck. Acute onset. No injury. States she is having difficulty swallowing.  She took ibuprofen.   No recent illness.  Denies fever, chills, dizziness, ear pain, sore throat, chest pain, palpitations, shortness of breath, abdominal pain, N/V/D. Denies rash.    Health Maintenance Due  Topic Date Due   HIV Screening  Never done   Hepatitis C Screening  Never done   MAMMOGRAM  10/18/2017   COVID-19 Vaccine (5 - 2023-24 season) 10/04/2021    Past Medical History:  Diagnosis Date   Anemia    Anxiety    Arthritis    back   Breast cancer ([place]) 08/03/2013   left   Cataract    LEFT FORMING   Dental crown present    Depression    GERD (gastroesophageal reflux disease)    no current med.   Hyperlipidemia    Hypertension    Radiation 01/18/14-03/09/14   Left breast    Past Surgical History:  Procedure Laterality Date   ABDOMINAL HYSTERECTOMY  12/16/2007   FOOT SURGERY Right    Lumpectory Left  August 30, 2013   [facility] REMOVAL Left 01/19/2015   Procedure: MINOR REMOVAL PORT-A-CATH;  Surgeon: [name], MD;  Location: [place];  Service: General;  Laterality: Left;   PORTACATH PLACEMENT Right 09/26/2013   Procedure: INSERTION PORT-A-CATH;  Surgeon: Shann Medal, MD;  Location: [facility];  Service: General;  Laterality: Right;    Family History  Problem Relation Age of Onset   Hypertension Mother    Diabetes Mother    Hypertension Father    Heart failure Father    Heart attack Father    Breast cancer Paternal Aunt    Cervical cancer Paternal Aunt    Colon cancer  Paternal Uncle    Prostate cancer Paternal Uncle    Crohn's disease Neg Hx    Esophageal cancer Neg Hx    Rectal cancer Neg Hx    Stomach cancer Neg Hx     Social History   Socioeconomic History   Marital status: Divorced    Spouse name: Not on file   Number of children: Not on file   Years of education: Not on file   Highest education level: Not on file  Occupational History   Not on file  Tobacco Use   Smoking status: Former    Packs/day: 0.25    Years: 2.50    Total pack years: 0.63    Types: Cigarettes    Quit date: 02/03/2011    Years since quitting: 11.0    Passive exposure: Never   Smokeless tobacco: Never  Vaping Use   Vaping Use: Never used  Substance and Sexual Activity   Alcohol use: Yes    Comment: ONCE A WEEK   Drug use: No   Sexual activity: Yes  Other Topics Concern   Not on file  Social History Narrative   Not on file   Social Determinants of Health   Financial Resource Strain: Not on file  Food Insecurity: Not on file  Transportation Needs: Not on file  Physical Activity: Not on file  Stress: Not on file  Social Connections: Not on file  Intimate Partner Violence: Not on file    Outpatient Medications Prior to Visit  Medication Sig Dispense Refill   atorvastatin (LIPITOR) 20 MG tablet Take 1 tablet (20 mg total) by mouth daily. 90 tablet 3   Cholecalciferol (VITAMIN D3 PO) Take by mouth daily. 2000 IU     Cyanocobalamin (VITAMIN B-12 SL)      lisinopril-hydrochlorothiazide (ZESTORETIC) 10-12.5 MG tablet Take 1 tablet by mouth daily. 30 tablet 3   metFORMIN (GLUCOPHAGE) 500 MG tablet Take 1 tablet (500 mg total) by mouth daily with breakfast. 90 tablet 1   Multiple Vitamin (MULTIVITAMIN PO) Take by mouth daily. CENTRUM     venlafaxine XR (EFFEXOR-XR) 75 MG 24 hr capsule TAKE 1 CAPSULE BY MOUTH DAILY WITH BREAKFAST. 90 capsule 0   No facility-administered medications prior to visit.    Allergies  Allergen Reactions   Hydrocodone Other  (See Comments)    Makes very constipated     ROS     Objective:    Physical Exam Constitutional:      General: She is not in acute distress.    Appearance: She is not ill-appearing.  HENT:     Left Ear: Tympanic membrane, ear canal and external ear normal.     Nose: Nose normal.     Mouth/Throat:     Mouth: Mucous membranes are moist.  Eyes:     Extraocular Movements: Extraocular movements intact.     Conjunctiva/sclera: Conjunctivae normal.  Neck:     Thyroid: No thyromegaly or thyroid tenderness.     Vascular: No JVD.     Trachea: Trachea and phonation normal.  Cardiovascular:     Rate and Rhythm: Normal rate and regular rhythm.  Pulmonary:     Effort: Pulmonary effort is normal.     Breath sounds: Normal breath sounds.  Musculoskeletal:     Cervical back: Edema present. No erythema. Pain with movement present. No spinous process tenderness or muscular tenderness.  Lymphadenopathy:     Head:     Right side of head: No preauricular, posterior auricular or occipital adenopathy.     Left side of head: No submental, submandibular, tonsillar, preauricular, posterior auricular or occipital adenopathy.     Cervical: Cervical adenopathy present.     Upper Body:     Right upper body: No supraclavicular or axillary adenopathy.     Left upper body: No supraclavicular or axillary adenopathy.     Comments: Right anterior cervical lymphadenopathy and TTP.  Airway patent.  No erythema or rash.  Normal appearing right preauricular, post auricular, occipital lymph nodes as well as normal left cervical lymph nodes  Neurological:     Mental Status: She is alert.     BP 102/74 (BP Location: Left Arm, Patient Position: Sitting, Cuff Size: Large)   Pulse 88   Temp 97.6 F (36.4 C) (Temporal)   Ht '5\' 7"'$  (1.702 m)   Wt 194 lb (88 kg)   LMP 11/03/2008   SpO2 96%   BMI 30.38 kg/m  Wt Readings from Last 3 Encounters:  02/12/22 194 lb (88 kg)  01/01/22 194 lb (88 kg)  12/19/21 194  lb (88 kg)       Assessment & Plan:   Problem List Items Addressed This Visit   None Visit Diagnoses     Lymphadenitis    -  Primary   Localized swelling, mass and lump, neck       Relevant Medications   amoxicillin-clavulanate (AUGMENTIN) 875-125 MG tablet   Other Relevant Orders   DG Neck Soft Tissue (Completed)   C-reactive protein   CBC with Differential/Platelet   Oropharyngeal dysphagia       Relevant Orders   DG Neck Soft Tissue (Completed)   C-reactive protein   CBC with Differential/Platelet      She is currently not in any acute distress.  Stat plain film of her neck ordered. Stat CBC and CRP ordered.  Neck XR is negative for swelling, foreign body or free air.  Awaiting CBC and CRP.  Augmentin prescribed. Take ibuprofen prn and use ice pack.  Follow up next week.  Strict precautions to go to the ED if worsening.    I am having Jean [name] start on amoxicillin-clavulanate. I am also having her maintain her Cyanocobalamin (VITAMIN B-12 SL), Multiple Vitamin (MULTIVITAMIN PO), Cholecalciferol (VITAMIN D3 PO), lisinopril-hydrochlorothiazide, metFORMIN, atorvastatin, and venlafaxine XR.  Meds ordered this encounter  Medications   amoxicillin-clavulanate (AUGMENTIN) 875-125 MG tablet    Sig: Take 1 tablet by mouth 2 (two) times daily.    Dispense:  20 tablet    Refill:  0    Order Specific Question:   Supervising Provider    Answer:   Pricilla Holm A [[id number]]

## 2022-02-12 NOTE — Patient Instructions (Signed)
Take the antibiotic as prescribed with food.  Stay well-hydrated.  You may take ibuprofen 600 mg or 800 mg up to 3 times daily with food.  I recommend using an ice pack on the area.  Follow-up with me next week.  If you get significantly worse then go to the emergency department.

## 2022-02-18 ENCOUNTER — Ambulatory Visit: Payer: 59 | Admitting: Family Medicine

## 2022-02-20 ENCOUNTER — Other Ambulatory Visit: Payer: Self-pay | Admitting: Internal Medicine

## 2022-02-20 NOTE — Telephone Encounter (Signed)
No longer under Dr. Durenda Age care. Dosage changed. Requested Prescriptions  Pending Prescriptions Disp Refills   atorvastatin (LIPITOR) 10 MG tablet [Pharmacy Med Name: ATORVASTATIN 10 MG TABLET] 90 tablet 3    Sig: TAKE 1 TABLET BY MOUTH EVERY DAY     Cardiovascular:  Antilipid - Statins Failed - 02/20/2022  9:31 AM      Failed - Lipid Panel in normal range within the last 12 months    Cholesterol, Total  Date Value Ref Range Status  04/03/2021 264 (H) 100 - 199 mg/dL Final   Cholesterol  Date Value Ref Range Status  12/19/2021 169 0 - 200 mg/dL Final    Comment:    ATP III Classification       Desirable:  < 200 mg/dL               Borderline High:  200 - 239 mg/dL          High:  > = 240 mg/dL   LDL Chol Calc (NIH)  Date Value Ref Range Status  04/03/2021 183 (H) 0 - 99 mg/dL Final   LDL Cholesterol  Date Value Ref Range Status  12/19/2021 107 (H) 0 - 99 mg/dL Final   HDL  Date Value Ref Range Status  12/19/2021 47.10 >39.00 mg/dL Final  04/03/2021 55 >39 mg/dL Final   Triglycerides  Date Value Ref Range Status  12/19/2021 72.0 0.0 - 149.0 mg/dL Final    Comment:    Normal:  <150 mg/dLBorderline High:  150 - 199 mg/dL         Passed - Patient is not pregnant      Passed - Valid encounter within last 12 months    Recent Outpatient Visits           7 months ago Essential hypertension   [place], Deborah B, MD   9 months ago Essential hypertension   [place], Stephen L, RPH-CPP   10 months ago Establishing care with new doctor, encounter for   [place], MD       Future Appointments             In 5 days Girtha Rm, NP-C [facility] at [place]   In 1 month Girtha Rm, NP-C [facility] at [facility]

## 2022-02-25 ENCOUNTER — Encounter: Payer: Self-pay | Admitting: Family Medicine

## 2022-02-25 ENCOUNTER — Ambulatory Visit: Payer: 59 | Admitting: Family Medicine

## 2022-02-25 VITALS — BP 130/84 | HR 67 | Temp 97.5°F | Ht 67.0 in | Wt 194.0 lb

## 2022-02-25 DIAGNOSIS — N898 Other specified noninflammatory disorders of vagina: Secondary | ICD-10-CM | POA: Diagnosis not present

## 2022-02-25 DIAGNOSIS — R221 Localized swelling, mass and lump, neck: Secondary | ICD-10-CM | POA: Diagnosis not present

## 2022-02-25 MED ORDER — FLUCONAZOLE 150 MG PO TABS: 150.0000 mg | ORAL_TABLET | Freq: Once | ORAL | 0 refills | Status: AC

## 2022-02-25 NOTE — Progress Notes (Signed)
Subjective:     Patient ID: Jean [name], female    DOB: 1960/02/05, 62 y.o.   MRN: [id number]  Chief Complaint  Patient presents with   Follow-up    1 week f/u on neck swelling, swelling has gone down and is not tender/sore anymore but feels as if she still may have some mucus stuck in her throat. Reports she can swallow now.     HPI Patient is in today for follow up on right sided neck swelling and tenderness.  No longer having difficulty swallowing. Swelling and pain resolved and is 90% back to baseline. She has 2 days of Augmentin left.   New issue today with vaginal itching related to antibiotics. Denies any concern for STDs.   Denies fever, chills, dizziness, chest pain, palpitations, shortness of breath, abdominal pain, N/V/D, urinary symptoms.    Health Maintenance Due  Topic Date Due   HIV Screening  Never done   Hepatitis C Screening  Never done   MAMMOGRAM  10/18/2017   COVID-19 Vaccine (5 - 2023-24 season) 10/04/2021    Past Medical History:  Diagnosis Date   Anemia    Anxiety    Arthritis    back   Breast cancer ([place]) 08/03/2013   left   Cataract    LEFT FORMING   Dental crown present    Depression    GERD (gastroesophageal reflux disease)    no current med.   Hyperlipidemia    Hypertension    Radiation 01/18/14-03/09/14   Left breast    Past Surgical History:  Procedure Laterality Date   ABDOMINAL HYSTERECTOMY  12/16/2007   FOOT SURGERY Right    Lumpectory Left  August 30, 2013   [facility] REMOVAL Left 01/19/2015   Procedure: MINOR REMOVAL PORT-A-CATH;  Surgeon: [name], MD;  Location: [place];  Service: General;  Laterality: Left;   PORTACATH PLACEMENT Right 09/26/2013   Procedure: INSERTION PORT-A-CATH;  Surgeon: Shann Medal, MD;  Location: [facility];  Service: General;  Laterality: Right;    Family History  Problem Relation Age of Onset   Hypertension Mother    Diabetes Mother    Hypertension Father    Heart failure  Father    Heart attack Father    Breast cancer Paternal Aunt    Cervical cancer Paternal Aunt    Colon cancer Paternal Uncle    Prostate cancer Paternal Uncle    Crohn's disease Neg Hx    Esophageal cancer Neg Hx    Rectal cancer Neg Hx    Stomach cancer Neg Hx     Social History   Socioeconomic History   Marital status: Divorced    Spouse name: Not on file   Number of children: Not on file   Years of education: Not on file   Highest education level: Not on file  Occupational History   Not on file  Tobacco Use   Smoking status: Former    Packs/day: 0.25    Years: 2.50    Total pack years: 0.63    Types: Cigarettes    Quit date: 02/03/2011    Years since quitting: 11.0    Passive exposure: Never   Smokeless tobacco: Never  Vaping Use   Vaping Use: Never used  Substance and Sexual Activity   Alcohol use: Yes    Comment: ONCE A WEEK   Drug use: No   Sexual activity: Yes  Other Topics Concern   Not on file  Social History Narrative  Not on file   Social Determinants of Health   Financial Resource Strain: Not on file  Food Insecurity: Not on file  Transportation Needs: Not on file  Physical Activity: Not on file  Stress: Not on file  Social Connections: Not on file  Intimate Partner Violence: Not on file    Outpatient Medications Prior to Visit  Medication Sig Dispense Refill   atorvastatin (LIPITOR) 20 MG tablet Take 1 tablet (20 mg total) by mouth daily. 90 tablet 3   Cholecalciferol (VITAMIN D3 PO) Take by mouth daily. 2000 IU     Cyanocobalamin (VITAMIN B-12 SL)      lisinopril-hydrochlorothiazide (ZESTORETIC) 10-12.5 MG tablet Take 1 tablet by mouth daily. 30 tablet 3   metFORMIN (GLUCOPHAGE) 500 MG tablet Take 1 tablet (500 mg total) by mouth daily with breakfast. 90 tablet 1   Multiple Vitamin (MULTIVITAMIN PO) Take by mouth daily. CENTRUM     venlafaxine XR (EFFEXOR-XR) 75 MG 24 hr capsule TAKE 1 CAPSULE BY MOUTH DAILY WITH BREAKFAST. 90 capsule 0    amoxicillin-clavulanate (AUGMENTIN) 875-125 MG tablet Take 1 tablet by mouth 2 (two) times daily. (Patient not taking: Reported on 02/25/2022) 20 tablet 0   No facility-administered medications prior to visit.    Allergies  Allergen Reactions   Hydrocodone Other (See Comments)    Makes very constipated     ROS     Objective:    Physical Exam Constitutional:      General: She is not in acute distress.    Appearance: She is not ill-appearing.  HENT:     Mouth/Throat:     Mouth: Mucous membranes are moist.     Pharynx: Oropharynx is clear.  Eyes:     Conjunctiva/sclera: Conjunctivae normal.  Neck:     Thyroid: No thyromegaly or thyroid tenderness.  Cardiovascular:     Rate and Rhythm: Normal rate.  Pulmonary:     Effort: Pulmonary effort is normal.  Musculoskeletal:     Cervical back: Normal range of motion and neck supple. No edema, erythema or tenderness. No pain with movement. Normal range of motion.  Lymphadenopathy:     Cervical: No cervical adenopathy.  Skin:    General: Skin is warm and dry.  Neurological:     General: No focal deficit present.     Mental Status: She is alert and oriented to person, place, and time.  Psychiatric:        Mood and Affect: Mood and affect normal.        Speech: Speech normal.        Behavior: Behavior normal.        Thought Content: Thought content normal.     BP 130/84 (BP Location: Left Arm, Patient Position: Sitting, Cuff Size: Large)   Pulse 67   Temp (!) 97.5 F (36.4 C) (Temporal)   Ht '5\' 7"'$  (1.702 m)   Wt 194 lb (88 kg)   LMP 11/03/2008   SpO2 99%   BMI 30.38 kg/m  Wt Readings from Last 3 Encounters:  02/25/22 194 lb (88 kg)  02/12/22 194 lb (88 kg)  01/01/22 194 lb (88 kg)       Assessment & Plan:   Problem List Items Addressed This Visit   None Visit Diagnoses     Localized swelling, mass and lump, neck    -  Primary   Vaginal itching       Relevant Medications   fluconazole (DIFLUCAN) 150 MG tablet  Reviewed labs and XR of neck in person with patient.  No known cause for recent swelling and pain of her neck. Symptoms have resolved and she is 90% back to baseline.  New problem of yeast infection r/t antibiotic therapy. Diflucan prescribed. Follow up if not back to baseline in 3 days.     I have discontinued Kandis Deleon's amoxicillin-clavulanate. I am also having her start on fluconazole. Additionally, I am having her maintain her Cyanocobalamin (VITAMIN B-12 SL), Multiple Vitamin (MULTIVITAMIN PO), Cholecalciferol (VITAMIN D3 PO), lisinopril-hydrochlorothiazide, metFORMIN, atorvastatin, and venlafaxine XR.  Meds ordered this encounter  Medications   fluconazole (DIFLUCAN) 150 MG tablet    Sig: Take 1 tablet (150 mg total) by mouth once for 1 dose.    Dispense:  1 tablet    Refill:  0    Order Specific Question:   Supervising Provider    Answer:   Pricilla Holm A [[id number]]

## 2022-04-02 ENCOUNTER — Encounter: Payer: Self-pay | Admitting: Family Medicine

## 2022-04-02 ENCOUNTER — Ambulatory Visit: Payer: 59 | Admitting: Family Medicine

## 2022-04-02 VITALS — BP 122/84 | HR 72 | Temp 97.6°F | Ht 67.0 in | Wt 183.0 lb

## 2022-04-02 DIAGNOSIS — E7800 Pure hypercholesterolemia, unspecified: Secondary | ICD-10-CM

## 2022-04-02 DIAGNOSIS — E119 Type 2 diabetes mellitus without complications: Secondary | ICD-10-CM

## 2022-04-02 DIAGNOSIS — Z79899 Other long term (current) drug therapy: Secondary | ICD-10-CM | POA: Diagnosis not present

## 2022-04-02 DIAGNOSIS — I10 Essential (primary) hypertension: Secondary | ICD-10-CM | POA: Diagnosis not present

## 2022-04-02 DIAGNOSIS — F411 Generalized anxiety disorder: Secondary | ICD-10-CM

## 2022-04-02 LAB — LIPID PANEL
Cholesterol: 162 mg/dL (ref 0–200)
HDL: 47.5 mg/dL (ref >39.00)
LDL Cholesterol: 96 mg/dL (ref 0–99)
NonHDL: 114.22
Total CHOL/HDL Ratio: 3
Triglycerides: 90 mg/dL (ref 0.0–149.0)
VLDL: 18 mg/dL (ref 0.0–40.0)

## 2022-04-02 LAB — CBC WITH DIFFERENTIAL/PLATELET
Basophils Absolute: 0 10*3/uL (ref 0.0–0.1)
Basophils Relative: 0.5 % (ref 0.0–3.0)
Eosinophils Absolute: 0 10*3/uL (ref 0.0–0.7)
Eosinophils Relative: 0.8 % (ref 0.0–5.0)
HCT: 37.5 % (ref 36.0–46.0)
Hemoglobin: 12.5 g/dL (ref 12.0–15.0)
Lymphocytes Relative: 36.1 % (ref 12.0–46.0)
Lymphs Abs: 1.7 10*3/uL (ref 0.7–4.0)
MCHC: 33.2 g/dL (ref 30.0–36.0)
MCV: 84 fl (ref 78.0–100.0)
Monocytes Absolute: 0.3 10*3/uL (ref 0.1–1.0)
Monocytes Relative: 7 % (ref 3.0–12.0)
Neutro Abs: 2.5 10*3/uL (ref 1.4–7.7)
Neutrophils Relative %: 55.6 % (ref 43.0–77.0)
Platelets: 377 10*3/uL (ref 150.0–400.0)
RBC: 4.46 Mil/uL (ref 3.87–5.11)
RDW: 14 % (ref 11.5–15.5)
WBC: 4.6 10*3/uL (ref 4.0–10.5)

## 2022-04-02 LAB — COMPREHENSIVE METABOLIC PANEL
ALT: 16 U/L (ref 0–35)
AST: 18 U/L (ref 0–37)
Albumin: 4.3 g/dL (ref 3.5–5.2)
Alkaline Phosphatase: 60 U/L (ref 39–117)
BUN: 24 mg/dL — ABNORMAL HIGH (ref 6–23)
CO2: 29 mEq/L (ref 19–32)
Calcium: 10.1 mg/dL (ref 8.4–10.5)
Chloride: 103 mEq/L (ref 96–112)
Creatinine, Ser: 0.88 mg/dL (ref 0.40–1.20)
GFR: 71.36 mL/min (ref >60.00)
Glucose, Bld: 99 mg/dL (ref 70–99)
Potassium: 4.2 mEq/L (ref 3.5–5.1)
Sodium: 139 mEq/L (ref 135–145)
Total Bilirubin: 0.3 mg/dL (ref 0.2–1.2)
Total Protein: 7.8 g/dL (ref 6.0–8.3)

## 2022-04-02 LAB — VITAMIN B12: Vitamin B-12: 292 pg/mL (ref 211–911)

## 2022-04-02 LAB — VITAMIN D 25 HYDROXY (VIT D DEFICIENCY, FRACTURES): VITD: 33.67 ng/mL (ref 30.00–100.00)

## 2022-04-02 LAB — HEMOGLOBIN A1C: Hgb A1c MFr Bld: 6.3 % (ref 4.6–6.5)

## 2022-04-02 LAB — TSH: TSH: 0.8 u[IU]/mL (ref 0.35–5.50)

## 2022-04-02 NOTE — Patient Instructions (Signed)
Please go downstairs for labs before you leave today.  Keep up the good work with diet and exercise.  Continue your current medications.  Let me know when you need refills.  I will see you back in 6 months or sooner if needed.

## 2022-04-02 NOTE — Assessment & Plan Note (Signed)
Controlled. Continue lisinopril-HCTZ 10-12.5 mg daily Check labs.

## 2022-04-02 NOTE — Progress Notes (Signed)
Subjective:     Patient ID: Jean [name], female    DOB: 1960-02-26, 62 y.o.   MRN: [id number]  Chief Complaint  Patient presents with   Follow-up    3 month f/u, Lipid and A1c recheck    HPI Patient is in today for follow up on diabetes and HL.   She has cut back on carbohydrates.  Walking for exercise.  Started metformin at previous visit.   Her mood is good.   Increased statin dose.   Denies fever, chills, dizziness, chest pain, palpitations, shortness of breath, abdominal pain, N/V/D, urinary symptoms, LE edema.    Health Maintenance Due  Topic Date Due   FOOT EXAM  Never done   OPHTHALMOLOGY EXAM  Never done   HIV Screening  Never done   Hepatitis C Screening  Never done   MAMMOGRAM  10/18/2017    Past Medical History:  Diagnosis Date   Anemia    Anxiety    Arthritis    back   Breast cancer ([place]) 08/03/2013   left   Cataract    LEFT FORMING   Dental crown present    Depression    GERD (gastroesophageal reflux disease)    no current med.   Hyperlipidemia    Hypertension    Radiation 01/18/14-03/09/14   Left breast    Past Surgical History:  Procedure Laterality Date   ABDOMINAL HYSTERECTOMY  12/16/2007   FOOT SURGERY Right    Lumpectory Left  August 30, 2013   [facility] REMOVAL Left 01/19/2015   Procedure: MINOR REMOVAL PORT-A-CATH;  Surgeon: [name], MD;  Location: [place];  Service: General;  Laterality: Left;   PORTACATH PLACEMENT Right 09/26/2013   Procedure: INSERTION PORT-A-CATH;  Surgeon: Shann Medal, MD;  [facility];  Service: General;  Laterality: Right;    Family History  Problem Relation Age of Onset   Hypertension Mother    Diabetes Mother    Hypertension Father    Heart failure Father    Heart attack Father    Breast cancer Paternal Aunt    Cervical cancer Paternal Aunt    Colon cancer Paternal Uncle    Prostate cancer Paternal Uncle    Crohn's disease Neg Hx    Esophageal cancer Neg Hx     Rectal cancer Neg Hx    Stomach cancer Neg Hx     Social History   Socioeconomic History   Marital status: Divorced    Spouse name: Not on file   Number of children: Not on file   Years of education: Not on file   Highest education level: Not on file  Occupational History   Not on file  Tobacco Use   Smoking status: Former    Packs/day: 0.25    Years: 2.50    Total pack years: 0.63    Types: Cigarettes    Quit date: 02/03/2011    Years since quitting: 11.1    Passive exposure: Never   Smokeless tobacco: Never  Vaping Use   Vaping Use: Never used  Substance and Sexual Activity   Alcohol use: Yes    Comment: ONCE A WEEK   Drug use: No   Sexual activity: Yes  Other Topics Concern   Not on file  Social History Narrative   Not on file   Social Determinants of Health   Financial Resource Strain: Not on file  Food Insecurity: Not on file  Transportation Needs: Not on file  Physical Activity:  Not on file  Stress: Not on file  Social Connections: Not on file  Intimate Partner Violence: Not on file    Outpatient Medications Prior to Visit  Medication Sig Dispense Refill   atorvastatin (LIPITOR) 20 MG tablet Take 1 tablet (20 mg total) by mouth daily. 90 tablet 3   Cholecalciferol (VITAMIN D3 PO) Take by mouth daily. 2000 IU     Cyanocobalamin (VITAMIN B-12 SL)      lisinopril-hydrochlorothiazide (ZESTORETIC) 10-12.5 MG tablet Take 1 tablet by mouth daily. 30 tablet 3   metFORMIN (GLUCOPHAGE) 500 MG tablet Take 1 tablet (500 mg total) by mouth daily with breakfast. 90 tablet 1   Multiple Vitamin (MULTIVITAMIN PO) Take by mouth daily. CENTRUM     venlafaxine XR (EFFEXOR-XR) 75 MG 24 hr capsule TAKE 1 CAPSULE BY MOUTH DAILY WITH BREAKFAST. 90 capsule 0   No facility-administered medications prior to visit.    Allergies  Allergen Reactions   Hydrocodone Other (See Comments)    Makes very constipated     ROS     Objective:    Physical Exam Constitutional:       General: She is not in acute distress.    Appearance: She is not ill-appearing.  Cardiovascular:     Rate and Rhythm: Normal rate and regular rhythm.  Pulmonary:     Effort: Pulmonary effort is normal.     Breath sounds: Normal breath sounds.  Musculoskeletal:     Cervical back: Normal range of motion and neck supple.  Skin:    General: Skin is warm and dry.  Neurological:     General: No focal deficit present.     Mental Status: She is alert and oriented to person, place, and time.  Psychiatric:        Mood and Affect: Mood normal.        Behavior: Behavior normal.        Thought Content: Thought content normal.     BP 122/84 (BP Location: Left Arm, Patient Position: Sitting, Cuff Size: Large)   Pulse 72   Temp 97.6 F (36.4 C) (Temporal)   Ht '5\' 7"'$  (1.702 m)   Wt 183 lb (83 kg)   LMP 11/03/2008   SpO2 97%   BMI 28.66 kg/m  Wt Readings from Last 3 Encounters:  04/02/22 183 lb (83 kg)  02/25/22 194 lb (88 kg)  02/12/22 194 lb (88 kg)       Assessment & Plan:   Problem List Items Addressed This Visit       Cardiovascular and Mediastinum   Essential hypertension    Controlled. Continue lisinopril-HCTZ 10-12.5 mg daily Check labs.       Relevant Orders   CBC with Differential/Platelet (Completed)   Comprehensive metabolic panel (Completed)     Endocrine   Type 2 diabetes mellitus without complication, without long-term current use of insulin (HCC)    Continue metformin, healthy diet and exercise. Check A1c. I will also screen for B12 deficiency due to metformin.       Relevant Orders   CBC with Differential/Platelet (Completed)   Comprehensive metabolic panel (Completed)   Hemoglobin A1c (Completed)   TSH (Completed)   Vitamin B12 (Completed)     Other   GAD (generalized anxiety disorder)    Mood is good. Continue Effexor 75 mg daily       Pure hypercholesterolemia - Primary    Increased statin at previous visit. Check lipids. Continue statin, low  fat, low cholesterol diet  and exercise.       Relevant Orders   Lipid panel (Completed)   Other Visit Diagnoses     Medication management       Relevant Orders   VITAMIN D 25 Hydroxy (Vit-D Deficiency, Fractures) (Completed)       I am having Jean [name] maintain her Cyanocobalamin (VITAMIN B-12 SL), Multiple Vitamin (MULTIVITAMIN PO), Cholecalciferol (VITAMIN D3 PO), lisinopril-hydrochlorothiazide, metFORMIN, atorvastatin, and venlafaxine XR.  No orders of the defined types were placed in this encounter.

## 2022-04-02 NOTE — Assessment & Plan Note (Signed)
Continue metformin, healthy diet and exercise. Check A1c. I will also screen for B12 deficiency due to metformin.

## 2022-04-02 NOTE — Assessment & Plan Note (Signed)
Mood is good. Continue Effexor 75 mg daily

## 2022-04-02 NOTE — Assessment & Plan Note (Signed)
Increased statin at previous visit. Check lipids. Continue statin, low fat, low cholesterol diet and exercise.

## 2022-04-03 ENCOUNTER — Other Ambulatory Visit: Payer: Self-pay | Admitting: Internal Medicine

## 2022-04-03 NOTE — Telephone Encounter (Signed)
Unable to refill per protocol, another provider listed as PCP.  Requested Prescriptions  Pending Prescriptions Disp Refills   lisinopril-hydrochlorothiazide (ZESTORETIC) 10-12.5 MG tablet [Pharmacy Med Name: LISINOPRIL-HCTZ 10-12.5 MG TAB] 90 tablet 2    Sig: TAKE 1 TABLET BY MOUTH EVERY DAY     Cardiovascular:  ACEI + Diuretic Combos Failed - 04/03/2022  1:06 AM      Failed - Valid encounter within last 6 months    Recent Outpatient Visits           9 months ago Essential hypertension   [place], MD   11 months ago Essential hypertension   [place], RPH-CPP   1 year ago Establishing care with new doctor, encounter for   [place], MD       Future Appointments             In 6 months Girtha Rm, NP-C [place] [facility] [place] in normal range and within 180 days    Sodium  Date Value Ref Range Status  04/02/2022 139 135 - 145 mEq/L Final  04/03/2021 142 134 - 144 mmol/L Final  10/23/2015 141 136 - 145 mEq/L Final         Passed - K in normal range and within 180 days    Potassium  Date Value Ref Range Status  04/02/2022 4.2 3.5 - 5.1 mEq/L Final  10/23/2015 4.0 3.5 - 5.1 mEq/L Final         Passed - Cr in normal range and within 180 days    Creatinine  Date Value Ref Range Status  10/23/2015 0.9 0.6 - 1.1 mg/dL Final   Creatinine, Ser  Date Value Ref Range Status  04/02/2022 0.88 0.40 - 1.20 mg/dL Final   Creatinine,U  Date Value Ref Range Status  01/01/2022 176.9 mg/dL Final         Passed - eGFR is 30 or above and within 180 days    GFR calc Af Amer  Date Value Ref Range Status  01/10/2014 >90 >90 mL/min Final    Comment:    (NOTE) The eGFR has been calculated using the CKD EPI equation. This calculation has not been validated in  all clinical situations. eGFR's persistently <90 mL/min signify possible Chronic Kidney Disease.    GFR, Estimated  Date Value Ref Range Status  10/21/2021 >60 >60 mL/min Final    Comment:    (NOTE) Calculated using the CKD-EPI Creatinine Equation (2021)    GFR  Date Value Ref Range Status  04/02/2022 71.36 >60.00 mL/min Final    Comment:    Calculated using the CKD-EPI Creatinine Equation (2021)   eGFR  Date Value Ref Range Status  04/03/2021 82 >59 mL/min/1.73 Final         Passed - Patient is not pregnant      Passed - Last BP in normal range    BP Readings from Last 1 Encounters:  04/02/22 122/84

## 2022-04-05 ENCOUNTER — Other Ambulatory Visit: Payer: Self-pay | Admitting: Internal Medicine

## 2022-04-12 ENCOUNTER — Other Ambulatory Visit: Payer: Self-pay | Admitting: Family Medicine

## 2022-04-12 DIAGNOSIS — F411 Generalized anxiety disorder: Secondary | ICD-10-CM

## 2022-06-11 ENCOUNTER — Other Ambulatory Visit: Payer: Self-pay | Admitting: Internal Medicine

## 2022-06-12 NOTE — Telephone Encounter (Signed)
Pt no loner under prescribers care Requested Prescriptions  Refused Prescriptions Disp Refills   atorvastatin (LIPITOR) 10 MG tablet [Pharmacy Med Name: ATORVASTATIN 10 MG TABLET] 90 tablet 3    Sig: TAKE 1 TABLET BY MOUTH EVERY DAY     Cardiovascular:  Antilipid - Statins Failed - 06/11/2022 11:14 PM      Failed - Lipid Panel in normal range within the last 12 months    Cholesterol, Total  Date Value Ref Range Status  04/03/2021 264 (H) 100 - 199 mg/dL Final   Cholesterol  Date Value Ref Range Status  04/02/2022 162 0 - 200 mg/dL Final    Comment:    ATP III Classification       Desirable:  < 200 mg/dL               Borderline High:  200 - 239 mg/dL          High:  > = 409 mg/dL   LDL Chol Calc (NIH)  Date Value Ref Range Status  04/03/2021 183 (H) 0 - 99 mg/dL Final   LDL Cholesterol  Date Value Ref Range Status  04/02/2022 96 0 - 99 mg/dL Final   HDL  Date Value Ref Range Status  04/02/2022 47.50 >39.00 mg/dL Final  81/19/1478 55 >29 mg/dL Final   Triglycerides  Date Value Ref Range Status  04/02/2022 90.0 0.0 - 149.0 mg/dL Final    Comment:    Normal:  <150 mg/dLBorderline High:  150 - 199 mg/dL         Passed - Patient is not pregnant      Passed - Valid encounter within last 12 months    Recent Outpatient Visits           11 months ago Essential hypertension   [place] Marcine Matar, MD   1 year ago Essential hypertension   [place] [facility] Drucilla Chalet, RPH-CPP   1 year ago Establishing care with new doctor, encounter for   [facility] Marcine Matar, MD       Future Appointments             In 3 months Avanell Shackleton, NP-C [place] [facility]

## 2022-06-16 ENCOUNTER — Encounter: Payer: Self-pay | Admitting: Family Medicine

## 2022-06-16 ENCOUNTER — Ambulatory Visit: Payer: 59 | Admitting: Family Medicine

## 2022-06-16 VITALS — BP 98/64 | HR 64 | Temp 97.8°F | Resp 20 | Ht 67.0 in | Wt 166.0 lb

## 2022-06-16 DIAGNOSIS — L309 Dermatitis, unspecified: Secondary | ICD-10-CM | POA: Diagnosis not present

## 2022-06-16 MED ORDER — BETAMETHASONE DIPROPIONATE 0.05 % EX CREA
TOPICAL_CREAM | Freq: Two times a day (BID) | CUTANEOUS | 0 refills | Status: AC
Start: 2022-06-16 — End: ?

## 2022-06-16 NOTE — Progress Notes (Signed)
Assessment & Plan:  1. Dermatitis Education provided on atopic dermatitis. - betamethasone dipropionate 0.05 % cream; Apply topically 2 (two) times daily.  Dispense: 45 g; Refill: 0   Follow up plan: Return if symptoms worsen or fail to improve.  Deliah Boston, MSN, APRN, FNP-C  Subjective:  HPI: Jean [name] is a 62 y.o. female presenting on 06/16/2022 for Rash (Right lower lower leg rash = itching area, getting larger. First noticed about 2 months ago. )  Patient reports a rash to her right lower leg that she noticed about two months ago. It is spreading. It does itch.  She has been applying hydrocortisone, which she does not feel is very effective.    ROS: Negative unless specifically indicated above in HPI.   Relevant past medical history reviewed and updated as indicated.   Allergies and medications reviewed and updated.   Current Outpatient Medications:    atorvastatin (LIPITOR) 20 MG tablet, Take 1 tablet (20 mg total) by mouth daily., Disp: 90 tablet, Rfl: 3   Cholecalciferol (VITAMIN D3 PO), Take by mouth daily. 2000 IU, Disp: , Rfl:    Cyanocobalamin (VITAMIN B-12 SL), , Disp: , Rfl:    lisinopril-hydrochlorothiazide (ZESTORETIC) 10-12.5 MG tablet, TAKE 1 TABLET BY MOUTH EVERY DAY, Disp: 90 tablet, Rfl: 1   metFORMIN (GLUCOPHAGE) 500 MG tablet, Take 1 tablet (500 mg total) by mouth daily with breakfast., Disp: 90 tablet, Rfl: 1   Multiple Vitamin (MULTIVITAMIN PO), Take by mouth daily. CENTRUM, Disp: , Rfl:    venlafaxine XR (EFFEXOR-XR) 75 MG 24 hr capsule, TAKE 1 CAPSULE BY MOUTH DAILY WITH BREAKFAST., Disp: 90 capsule, Rfl: 0  Allergies  Allergen Reactions   Hydrocodone Other (See Comments)    Makes very constipated     Objective:   BP 98/64   Pulse 64   Temp 97.8 F (36.6 C)   Resp 20   Ht 5\' 7"  (1.702 m)   Wt 166 lb (75.3 kg)   LMP 11/03/2008   BMI 26.00 kg/m    Physical Exam Vitals reviewed.  Constitutional:      General: She is not in acute  distress.    Appearance: Normal appearance. She is not ill-appearing, toxic-appearing or diaphoretic.  HENT:     Head: Normocephalic and atraumatic.  Eyes:     General: No scleral icterus.       Right eye: No discharge.        Left eye: No discharge.     Conjunctiva/sclera: Conjunctivae normal.  Cardiovascular:     Rate and Rhythm: Normal rate.  Pulmonary:     Effort: Pulmonary effort is normal. No respiratory distress.  Musculoskeletal:        General: Normal range of motion.     Cervical back: Normal range of motion.  Skin:    General: Skin is warm and dry.     Capillary Refill: Capillary refill takes less than 2 seconds.     Findings: Rash (patchy, dry, slightly raised to right shin) present.  Neurological:     General: No focal deficit present.     Mental Status: She is alert and oriented to person, place, and time. Mental status is at baseline.  Psychiatric:        Mood and Affect: Mood normal.        Behavior: Behavior normal.        Thought Content: Thought content normal.        Judgment: Judgment normal.

## 2022-07-02 ENCOUNTER — Ambulatory Visit (INDEPENDENT_AMBULATORY_CARE_PROVIDER_SITE_OTHER): Payer: 59

## 2022-07-02 ENCOUNTER — Encounter: Payer: Self-pay | Admitting: Podiatry

## 2022-07-02 ENCOUNTER — Ambulatory Visit: Payer: 59 | Admitting: Podiatry

## 2022-07-02 DIAGNOSIS — M722 Plantar fascial fibromatosis: Secondary | ICD-10-CM

## 2022-07-02 MED ORDER — MELOXICAM 15 MG PO TABS
15.0000 mg | ORAL_TABLET | Freq: Every day | ORAL | 0 refills | Status: DC
Start: 2022-07-02 — End: 2022-07-30

## 2022-07-02 NOTE — Progress Notes (Signed)
  Subjective:  Patient ID: Jean [name], female    DOB: 11/17/1960,   MRN: [id number]  Chief Complaint  Patient presents with   Foot Pain    Rm 22 Bilateral foot pain. Right heel and left heel and top of foot pain x 2 months. Aching pains with occasional sharp pains.     62 y.o. female presents for concern as above. Relates she has tried inserts and takes ibuprofen occasionally for her wrist.   . Denies any other pedal complaints. Denies n/v/f/c.   Past Medical History:  Diagnosis Date   Anemia    Anxiety    Arthritis    back   Breast cancer ([facility]CC) 08/03/2013   left   Cataract    LEFT FORMING   Dental crown present    Depression    GERD (gastroesophageal reflux disease)    no current med.   Hyperlipidemia    Hypertension    Radiation 01/18/14-03/09/14   Left breast    Objective:  Physical Exam: Vascular: DP/PT pulses 2/4 bilateral. CFT <3 seconds. Normal hair growth on digits. No edema.  Skin. No lacerations or abrasions bilateral feet.  Musculoskeletal: MMT 5/5 bilateral lower extremities in DF, PF, Inversion and Eversion. Deceased ROM in DF of ankle joint. Tender to bilateral medial calcaneal tubercle. Some pain through arch. No pain along PT or achilles. No pain with calcaneal squeeze. Some tenderness to dorsum of left second TMTJ area.  Neurological: Sensation intact to light touch.   Assessment:   1. Plantar fasciitis, bilateral      Plan:  Patient was evaluated and treated and all questions answered. Discussed plantar fasciitis with patient.  X-rays reviewed and discussed with patient. No acute fractures or dislocations noted. Mild spurring noted at inferior calcaneus.  Discussed treatment options including, ice, NSAIDS, supportive shoes, bracing, and stretching. Stretching exercises provided to be done on a daily basis.   Prescription for meloxicam provided and sent to pharmacy. PF brace dispensed bilateral.  Follow-up 6 weeks or sooner if any problems arise. In  the meantime, encouraged to call the office with any questions, concerns, change in symptoms.     Louann Sjogren, DPM

## 2022-07-02 NOTE — Patient Instructions (Signed)

## 2022-07-07 ENCOUNTER — Other Ambulatory Visit: Payer: Self-pay | Admitting: Family Medicine

## 2022-07-07 DIAGNOSIS — E119 Type 2 diabetes mellitus without complications: Secondary | ICD-10-CM

## 2022-07-14 ENCOUNTER — Other Ambulatory Visit: Payer: Self-pay | Admitting: Family Medicine

## 2022-07-14 DIAGNOSIS — F411 Generalized anxiety disorder: Secondary | ICD-10-CM

## 2022-07-15 NOTE — Telephone Encounter (Signed)
LOV 06-16-22

## 2022-07-30 ENCOUNTER — Other Ambulatory Visit: Payer: Self-pay | Admitting: Podiatry

## 2022-08-13 ENCOUNTER — Ambulatory Visit: Payer: 59 | Admitting: Podiatry

## 2022-08-19 ENCOUNTER — Ambulatory Visit: Payer: 59 | Admitting: Podiatry

## 2022-08-19 ENCOUNTER — Encounter: Payer: Self-pay | Admitting: Podiatry

## 2022-08-19 DIAGNOSIS — M722 Plantar fascial fibromatosis: Secondary | ICD-10-CM

## 2022-08-19 MED ORDER — MELOXICAM 15 MG PO TABS: 15.0000 mg | ORAL_TABLET | Freq: Every day | ORAL | 1 refills | Status: AC

## 2022-08-19 NOTE — Progress Notes (Signed)
  Subjective:  Patient ID: Jean [name], female    DOB: 08/07/1960,   MRN: [id number]  Chief Complaint  Patient presents with   Plantar Fasciitis    F/u appointment, "feet are doing about the same"    62 y.o. female presents for concern as above. Relates she has been stretching and wearing support. She relates the meloxicam has helped a bit.  . Denies any other pedal complaints. Denies n/v/f/c.   Past Medical History:  Diagnosis Date   Anemia    Anxiety    Arthritis    back   Breast cancer (HCC) 08/03/2013   left   Cataract    LEFT FORMING   Dental crown present    Depression    GERD (gastroesophageal reflux disease)    no current med.   Hyperlipidemia    Hypertension    Radiation 01/18/14-03/09/14   Left breast    Objective:  Physical Exam: Vascular: DP/PT pulses 2/4 bilateral. CFT <3 seconds. Normal hair growth on digits. No edema.  Skin. No lacerations or abrasions bilateral feet.  Musculoskeletal: MMT 5/5 bilateral lower extremities in DF, PF, Inversion and Eversion. Deceased ROM in DF of ankle joint. Tender to bilateral medial calcaneal tubercle. Some pain through arch. No pain along PT or achilles. No pain with calcaneal squeeze. Some tenderness to dorsum of left second TMTJ area.  Neurological: Sensation intact to light touch.   Assessment:   1. Plantar fasciitis, bilateral       Plan:  Patient was evaluated and treated and all questions answered. Discussed plantar fasciitis with patient.  X-rays reviewed and discussed with patient. No acute fractures or dislocations noted. Mild spurring noted at inferior calcaneus.  Discussed treatment options including, ice, NSAIDS, supportive shoes, bracing, and stretching. Continue brace and stretching.  Refill of meloxicam.  Amb ref to PT.  Follow-up 6 weeks or sooner if any problems arise. In the meantime, encouraged to call the office with any questions, concerns, change in symptoms.     Louann Sjogren, DPM

## 2022-08-21 ENCOUNTER — Other Ambulatory Visit: Payer: Self-pay | Admitting: Internal Medicine

## 2022-09-03 ENCOUNTER — Other Ambulatory Visit: Payer: Self-pay

## 2022-09-03 ENCOUNTER — Encounter (INDEPENDENT_AMBULATORY_CARE_PROVIDER_SITE_OTHER): Payer: Self-pay

## 2022-09-03 ENCOUNTER — Ambulatory Visit: Payer: 59 | Attending: Podiatry

## 2022-09-03 DIAGNOSIS — M25572 Pain in left ankle and joints of left foot: Secondary | ICD-10-CM | POA: Insufficient documentation

## 2022-09-03 DIAGNOSIS — M722 Plantar fascial fibromatosis: Secondary | ICD-10-CM | POA: Diagnosis not present

## 2022-09-03 DIAGNOSIS — M6281 Muscle weakness (generalized): Secondary | ICD-10-CM | POA: Insufficient documentation

## 2022-09-03 DIAGNOSIS — M25571 Pain in right ankle and joints of right foot: Secondary | ICD-10-CM | POA: Diagnosis present

## 2022-09-03 DIAGNOSIS — R262 Difficulty in walking, not elsewhere classified: Secondary | ICD-10-CM | POA: Diagnosis present

## 2022-09-03 NOTE — Therapy (Signed)
OUTPATIENT PHYSICAL THERAPY LOWER EXTREMITY EVALUATION   Patient Name: Jean [name] MRN: [id number] DOB:October 08, 1960, 62 y.o., female Today's Date: 09/04/2022  END OF SESSION:  PT End of Session - 09/03/22 1533     Visit Number 1    Number of Visits 13    Date for PT Re-Evaluation 10/18/22    Authorization Type U[employer]    PT Start Time 1532    PT Stop Time 1613    PT Time Calculation (min) 41 min    Activity Tolerance Patient tolerated treatment well    Behavior During Therapy [facility] for tasks assessed/performed             Past Medical History:  Diagnosis Date   Anemia    Anxiety    Arthritis    back   Breast cancer (HCC) 08/03/2013   left   Cataract    LEFT FORMING   Dental crown present    Depression    GERD (gastroesophageal reflux disease)    no current med.   Hyperlipidemia    Hypertension    Radiation 01/18/14-03/09/14   Left breast   Past Surgical History:  Procedure Laterality Date   ABDOMINAL HYSTERECTOMY  12/16/2007   FOOT SURGERY Right    Lumpectory Left  August 30, 2013   [facility] REMOVAL Left 01/19/2015   Procedure: MINOR REMOVAL PORT-A-CATH;  Surgeon: Ovidio Kin, MD;  Location: [facility];  Service: General;  Laterality: Left;   PORTACATH PLACEMENT Right 09/26/2013   Procedure: INSERTION PORT-A-CATH;  Surgeon: Kandis Cocking, MD;  Location: [facility];  Service: General;  Laterality: Right;   Patient Active Problem List   Diagnosis Date Noted   Type 2 diabetes mellitus without complication, without long-term current use of insulin (HCC) 04/02/2022   Menopausal depression 12/19/2021   Menopausal syndrome 12/19/2021   Pure hypercholesterolemia 12/19/2021   History of diabetes mellitus 12/19/2021   Gastroesophageal reflux disease 12/19/2021   Ganglion, left wrist 10/14/2021   Lateral epicondylitis of left elbow 06/19/2021   Wrist sprain, left, initial encounter 05/06/2021   Essential hypertension 04/03/2021   Obesity (BMI 30.0-34.9)  04/03/2021   GAD (generalized anxiety disorder) 04/03/2021   Major depressive disorder, single episode, mild (HCC) 04/03/2021   Closed fracture of head of left radius 03/25/2021   Neuropathy 08/18/2020   Breast lump 10/23/2015   Dry skin dermatitis 06/15/2014   Rash 05/18/2014   Anemia 01/10/2014   Symptomatic anemia 01/10/2014   Chest pain of uncertain etiology 12/23/2013   Breast cancer of upper-outer quadrant of left female breast (HCC) 08/12/2013    PCP: Avanell Shackleton, NP-C  REFERRING PROVIDER:   Louann Sjogren, DPM    REFERRING DIAG:  Diagnosis  M72.2 (ICD-10-CM) - Plantar fasciitis, bilateral    THERAPY DIAG:  Pain in right ankle and joints of right foot  Pain in left ankle and joints of left foot  Muscle weakness (generalized)  Difficulty in walking, not elsewhere classified  Rationale for Evaluation and Treatment: Rehabilitation  ONSET DATE: December 2023   SUBJECTIVE:   SUBJECTIVE STATEMENT: "My feet have been bothering me a lot." She reports 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 She does not recall a specific MOI, but did have a fall at work last February and isn't sure if this is related to the onset of plantar foot pain that began around December 2023. At  night her toes will cramp where she can't move them. She can't wear heels due to the pain. She reports occasional numbness/tingling in the toes. She has not received an injection.   PERTINENT HISTORY: History of breast cancer  PAIN:  Are you having pain? Yes: NPRS scale: 3 (at worst 9)/10 Pain location: metatarsal heads Lt foot; Rt calcaneous Pain description: stabbing  Aggravating factors: shoes, prolonged standing,walking > 20 minutes  Relieving factors: massage,heat  PRECAUTIONS: None   WEIGHT BEARING RESTRICTIONS: No  FALLS:  Has patient fallen in last 6 months?  No  LIVING ENVIRONMENT: Lives with: lives with their family Lives in: House/apartment Stairs: No Has following equipment at home: None  OCCUPATION: on worker's comp; Set designer work   PLOF: Independent  PATIENT GOALS: "feel better."    OBJECTIVE:   DIAGNOSTIC FINDINGS: X-rays Foot:  No acute fractures or dislocations noted. Mild spurring noted at inferior calcaneus.   PATIENT SURVEYS:  FOTO 54% function to 63% predicted   COGNITION: Overall cognitive status: Within functional limits for tasks assessed     SENSATION: WFL  EDEMA:  No obvious swelling about bilateral ankle/foot    POSTURE: pes planus, hallux valgus   PALPATION: TTP bilateral calcaneous, plantar fasica   LOWER EXTREMITY ROM:  Active ROM Right eval Left eval  Hip flexion    Hip extension    Hip abduction    Hip adduction    Hip internal rotation    Hip external rotation    Knee flexion    Passive great toe extension  35 35  Ankle dorsiflexion 2 2  Ankle plantarflexion    Ankle inversion 25 25  Ankle eversion 11 6 pn   (Blank rows = not tested)  LOWER EXTREMITY MMT:  MMT Right eval Left eval  Hip flexion    Hip extension 4 4  Hip abduction 4- 4-  Hip adduction    Hip internal rotation    Hip external rotation    Knee flexion    Knee extension    Ankle dorsiflexion 4 4  Ankle plantarflexion Partial range SL calf raise Partial range SL calf raise   Ankle inversion 4 4  Ankle eversion 4 4   (Blank rows = not tested)  LOWER EXTREMITY SPECIAL TESTS:  (+) Windlass   FUNCTIONAL TESTS:  Squat: limited depth, excessive anterior tibial translation, increased pain  SLS: 23 seconds LLE; 13 seconds RLE  GAIT: Distance walked: 10 ft Assistive device utilized: None Level of assistance: Complete Independence Comments: limited push off bilaterally; maintains RLE in supination during stance. Decreased step length    [facility] Adult PT Treatment:                                                 DATE: 09/03/22 Therapeutic Exercise: Demonstrated and issue initial HEP.    Therapeutic Activity: Education on assessment findings that will be addressed throughout duration of POC.       PATIENT EDUCATION:  Education details: see treatment  Person educated: Patient Education method: Explanation, Demonstration, Tactile cues, Verbal cues, and Handouts Education comprehension: verbalized understanding, returned demonstration, verbal cues required, tactile cues required, and needs further education  HOME EXERCISE PROGRAM: Access Code: [email or web address] URL: [email or web address] Date: 09/04/2022 Prepared by: Letitia Libra  Exercises - Seated Great Toe Extension  - 1 x daily - 7 x weekly - 2  sets - 10 reps - Seated Self Great Toe Stretch  - 1 x daily - 7 x weekly - 3 sets - 30 sec  hold - Long Sitting Calf Stretch with Strap  - 1 x daily - 7 x weekly - 3 sets - 30 sec  hold - Seated Ankle Circles  - 1 x daily - 7 x weekly - 2 sets - 10 reps  ASSESSMENT:  CLINICAL IMPRESSION: Patient is a 62 y.o. female who was seen today for physical therapy evaluation and treatment for bilateral plantar foot pain. Her signs and symptoms appear consistent with plantar fasciitis. Patient will benefit from skilled PT to address ROM, strength, balance, and gait impairments in order to optimize their function and assist in overall pain reduction.     OBJECTIVE IMPAIRMENTS: Abnormal gait, decreased activity tolerance, decreased balance, decreased knowledge of condition, difficulty walking, decreased ROM, decreased strength, impaired flexibility, improper body mechanics, postural dysfunction, and pain.   ACTIVITY LIMITATIONS: carrying, lifting, bending, standing, squatting, stairs, and locomotion level  PARTICIPATION LIMITATIONS: shopping and community activity  PERSONAL FACTORS: Age, Fitness, Time since onset of injury/illness/exacerbation, and 3+ comorbidities: see PMH above  are also  affecting patient's functional outcome.   REHAB POTENTIAL: Good  CLINICAL DECISION MAKING: Stable/uncomplicated  EVALUATION COMPLEXITY: Low   GOALS: Goals reviewed with patient? Yes  SHORT TERM GOALS: Target date: 09/25/2022   Patient will be independent and compliant with initial HEP.   Baseline: issued at eval Goal status: INITIAL  2.  Patient will demonstrate at least 10 degrees of bilateral ankle DF AROM to improve heel strike during gait cycle.  Baseline: see above Goal status: INITIAL  3.  Patient will demonstrate at least 45 degrees of bilateral great toe extension PROM to improve gait mechanics.  Baseline: see above  Goal status: INITIAL  4.  Patient will be able to complete full range SL calf raise bilaterally to improve push-off during gait cycle.  Baseline:  Goal status: INITIAL    LONG TERM GOALS: Target date: 10/18/2022    Patient will score at least 63% on FOTO to signify clinically meaningful improvement in functional abilities.   Baseline: see above Goal status: INITIAL  2.  Patient will demonstrate 5/5 bilateral ankle strength to improve gait stability.  Baseline: see above Goal status: INITIAL  3.  Patient will demonstrate at least 4+/5 bilateral hip abductor/extensor strength to improve stability about the chain with prolonged walking.  Baseline: see above Goal status: INITIAL  4.  Patient will report pain at worst rated as </=4/10 to reduce her current functional limitations.  Baseline: see above Goal status: INITIAL  5.  Patient will be independent with advanced home program to progress/maintain current level of function.  Baseline: initial HEP issued  Goal status: INITIAL     PLAN:  PT FREQUENCY: 1-2x/week  PT DURATION: 6 weeks  PLANNED INTERVENTIONS: Therapeutic exercises, Therapeutic activity, Neuromuscular re-education, Balance training, Gait training, Patient/Family education, Self Care, Joint mobilization, Aquatic Therapy,  Dry Needling, Electrical stimulation, Cryotherapy, Moist heat, Taping, Manual therapy, and Re-evaluation  PLAN FOR NEXT SESSION: review and progress HEP; ankle mobility and strengthening, foot intrinsic strengthening, gait training, calf stretching. Consider TPDN.   Letitia Libra, PT, DPT, ATC 09/04/22 11:40 AM

## 2022-09-08 ENCOUNTER — Ambulatory Visit: Payer: 59 | Attending: Podiatry

## 2022-09-08 DIAGNOSIS — M25571 Pain in right ankle and joints of right foot: Secondary | ICD-10-CM | POA: Diagnosis present

## 2022-09-08 DIAGNOSIS — M25572 Pain in left ankle and joints of left foot: Secondary | ICD-10-CM | POA: Diagnosis present

## 2022-09-08 DIAGNOSIS — R262 Difficulty in walking, not elsewhere classified: Secondary | ICD-10-CM | POA: Diagnosis present

## 2022-09-08 DIAGNOSIS — M6281 Muscle weakness (generalized): Secondary | ICD-10-CM | POA: Insufficient documentation

## 2022-09-08 NOTE — Patient Instructions (Signed)

## 2022-09-08 NOTE — Therapy (Signed)
OUTPATIENT PHYSICAL THERAPY TREATMENT   Patient Name: Jean [name] MRN: [id number] DOB:1961-01-09, 62 y.o., female Today's Date: 09/08/2022  END OF SESSION:  PT End of Session - 09/08/22 1419     Visit Number 2    Number of Visits 13    Date for PT Re-Evaluation 10/18/22    Authorization Type UHC    PT Start Time 1419    PT Stop Time 1500    PT Time Calculation (min) 41 min    Activity Tolerance Patient tolerated treatment well    Behavior During Therapy [facility] for tasks assessed/performed              Past Medical History:  Diagnosis Date   Anemia    Anxiety    Arthritis    back   Breast cancer (HCC) 08/03/2013   left   Cataract    LEFT FORMING   Dental crown present    Depression    GERD (gastroesophageal reflux disease)    no current med.   Hyperlipidemia    Hypertension    Radiation 01/18/14-03/09/14   Left breast   Past Surgical History:  Procedure Laterality Date   ABDOMINAL HYSTERECTOMY  12/16/2007   FOOT SURGERY Right    Lumpectory Left  August 30, 2013   [facility] REMOVAL Left 01/19/2015   Procedure: MINOR REMOVAL PORT-A-CATH;  Surgeon: Ovidio Kin, MD;  Location: [facility];  Service: General;  Laterality: Left;   PORTACATH PLACEMENT Right 09/26/2013   Procedure: INSERTION PORT-A-CATH;  Surgeon: Kandis Cocking, MD;  Location: [facility];  Service: General;  Laterality: Right;   Patient Active Problem List   Diagnosis Date Noted   Type 2 diabetes mellitus without complication, without long-term current use of insulin (HCC) 04/02/2022   Menopausal depression 12/19/2021   Menopausal syndrome 12/19/2021   Pure hypercholesterolemia 12/19/2021   History of diabetes mellitus 12/19/2021   Gastroesophageal reflux disease 12/19/2021   Ganglion, left wrist 10/14/2021   Lateral epicondylitis of left elbow 06/19/2021   Wrist sprain, left, initial encounter 05/06/2021   Essential hypertension 04/03/2021   Obesity (BMI 30.0-34.9) 04/03/2021   GAD  (generalized anxiety disorder) 04/03/2021   Major depressive disorder, single episode, mild (HCC) 04/03/2021   Closed fracture of head of left radius 03/25/2021   Neuropathy 08/18/2020   Breast lump 10/23/2015   Dry skin dermatitis 06/15/2014   Rash 05/18/2014   Anemia 01/10/2014   Symptomatic anemia 01/10/2014   Chest pain of uncertain etiology 12/23/2013   Breast cancer of upper-outer quadrant of left female breast (HCC) 08/12/2013    PCP: Avanell Shackleton, NP-C  REFERRING PROVIDER:   Louann Sjogren, DPM    REFERRING DIAG:  Diagnosis  M72.2 (ICD-10-CM) - Plantar fasciitis, bilateral    THERAPY DIAG:  Pain in right ankle and joints of right foot  Pain in left ankle and joints of left foot  Muscle weakness (generalized)  Difficulty in walking, not elsewhere classified  Rationale for Evaluation and Treatment: Rehabilitation  ONSET DATE: December 2023   SUBJECTIVE:   SUBJECTIVE STATEMENT: "They are ok."  PERTINENT HISTORY: History of breast cancer  PAIN:  Are you having pain? Yes: NPRS scale: 4/10 Pain location: plantar aspect of bilateral feet Pain description: stabbing  Aggravating factors: shoes, prolonged standing,walking > 20 minutes  Relieving factors: massage,heat  PRECAUTIONS: None     OBJECTIVE:   DIAGNOSTIC FINDINGS: X-rays Foot:  No acute fractures or dislocations noted. Mild spurring noted at inferior calcaneus.   PATIENT SURVEYS:  FOTO 54% function to 63% predicted   COGNITION: Overall cognitive status: Within functional limits for tasks assessed     SENSATION: WFL  EDEMA:  No obvious swelling about bilateral ankle/foot    POSTURE: pes planus, hallux valgus   PALPATION: TTP bilateral calcaneous, plantar fasica   LOWER EXTREMITY ROM:  Active ROM Right eval Left eval 09/08/22  Hip flexion     Hip extension     Hip abduction     Hip adduction     Hip internal rotation     Hip external rotation     Knee flexion      Passive great toe extension  35 35   Ankle dorsiflexion 2 2 Rt: 2; Lt: 3  Ankle plantarflexion     Ankle inversion 25 25   Ankle eversion 11 6 pn    (Blank rows = not tested)  LOWER EXTREMITY MMT:  MMT Right eval Left eval  Hip flexion    Hip extension 4 4  Hip abduction 4- 4-  Hip adduction    Hip internal rotation    Hip external rotation    Knee flexion    Knee extension    Ankle dorsiflexion 4 4  Ankle plantarflexion Partial range SL calf raise Partial range SL calf raise   Ankle inversion 4 4  Ankle eversion 4 4   (Blank rows = not tested)  LOWER EXTREMITY SPECIAL TESTS:  (+) Windlass   FUNCTIONAL TESTS:  Squat: limited depth, excessive anterior tibial translation, increased pain  SLS: 23 seconds LLE; 13 seconds RLE  GAIT: Distance walked: 10 ft Assistive device utilized: None Level of assistance: Complete Independence Comments: limited push off bilaterally; maintains RLE in supination during stance. Decreased step length  [facility] Adult PT Treatment:                                                DATE: 09/08/22 Therapeutic Exercise: Calf stretch with towel x 30 sec each Great toe flexor stretch x 30 sec each  Ankle circles x 10 each CW/CCW Seated great toe extension attempted Seated ankle rockerboard 2 x 10 A/P  Resisted ankle DF green band x 10 each  Manual Therapy: STM/DTM bilateral gastroc/soleus, plantar fascia Passive gastroc/soleus stretching     [facility] Adult PT Treatment:                                                DATE: 09/03/22 Therapeutic Exercise: Demonstrated and issue initial HEP.    Therapeutic Activity: Education on assessment findings that will be addressed throughout duration of POC.       PATIENT EDUCATION:  Education details: TPDN Person educated: Patient Education method: Chief Technology Officer Education comprehension: verbalized understanding and needs further education  HOME EXERCISE PROGRAM: Access Code: [email or web address] URL:  [email or web address] Date: 09/04/2022 Prepared by: Letitia Libra  Exercises - Seated Great Toe Extension  - 1 x daily - 7 x weekly - 2 sets - 10 reps - Seated Self Great Toe Stretch  - 1 x daily - 7 x weekly - 3 sets - 30 sec  hold - Long Sitting Calf Stretch with Strap  - 1 x daily - 7 x weekly - 3 sets - 30  sec  hold - Seated Ankle Circles  - 1 x daily - 7 x weekly - 2 sets - 10 reps  ASSESSMENT:  CLINICAL IMPRESSION: Patient arrives for first PT session with moderate plantar foot pain. She is noted to have multiple trigger points about gastroc/soleus bilaterally with partially release from manual therapy. Discussed potential benefit of TPDN with informational handout provided. Reviewed initial HEP with patient requiring cues for setup of great toe flexor stretch. She is unable to isolate great toe extension on the LLE, but on the RLE she is able to complete partial range of isolated great toe extension with facilitation technique. No changes made to HEP at this time.     OBJECTIVE IMPAIRMENTS: Abnormal gait, decreased activity tolerance, decreased balance, decreased knowledge of condition, difficulty walking, decreased ROM, decreased strength, impaired flexibility, improper body mechanics, postural dysfunction, and pain.   ACTIVITY LIMITATIONS: carrying, lifting, bending, standing, squatting, stairs, and locomotion level  PARTICIPATION LIMITATIONS: shopping and community activity  PERSONAL FACTORS: Age, Fitness, Time since onset of injury/illness/exacerbation, and 3+ comorbidities: see PMH above  are also affecting patient's functional outcome.   REHAB POTENTIAL: Good  CLINICAL DECISION MAKING: Stable/uncomplicated  EVALUATION COMPLEXITY: Low   GOALS: Goals reviewed with patient? Yes  SHORT TERM GOALS: Target date: 09/25/2022   Patient will be independent and compliant with initial HEP.   Baseline: issued at eval Goal status: INITIAL  2.  Patient will  demonstrate at least 10 degrees of bilateral ankle DF AROM to improve heel strike during gait cycle.  Baseline: see above Goal status: INITIAL  3.  Patient will demonstrate at least 45 degrees of bilateral great toe extension PROM to improve gait mechanics.  Baseline: see above  Goal status: INITIAL  4.  Patient will be able to complete full range SL calf raise bilaterally to improve push-off during gait cycle.  Baseline:  Goal status: INITIAL    LONG TERM GOALS: Target date: 10/18/2022    Patient will score at least 63% on FOTO to signify clinically meaningful improvement in functional abilities.   Baseline: see above Goal status: INITIAL  2.  Patient will demonstrate 5/5 bilateral ankle strength to improve gait stability.  Baseline: see above Goal status: INITIAL  3.  Patient will demonstrate at least 4+/5 bilateral hip abductor/extensor strength to improve stability about the chain with prolonged walking.  Baseline: see above Goal status: INITIAL  4.  Patient will report pain at worst rated as </=4/10 to reduce her current functional limitations.  Baseline: see above Goal status: INITIAL  5.  Patient will be independent with advanced home program to progress/maintain current level of function.  Baseline: initial HEP issued  Goal status: INITIAL     PLAN:  PT FREQUENCY: 1-2x/week  PT DURATION: 6 weeks  PLANNED INTERVENTIONS: Therapeutic exercises, Therapeutic activity, Neuromuscular re-education, Balance training, Gait training, Patient/Family education, Self Care, Joint mobilization, Aquatic Therapy, Dry Needling, Electrical stimulation, Cryotherapy, Moist heat, Taping, Manual therapy, and Re-evaluation  PLAN FOR NEXT SESSION: review and progress HEP; ankle mobility and strengthening, foot intrinsic strengthening, gait training, calf stretching. Consider TPDN.   Letitia Libra, PT, DPT, ATC 09/08/22 3:01 PM

## 2022-09-17 ENCOUNTER — Ambulatory Visit: Payer: 59

## 2022-09-17 DIAGNOSIS — M25571 Pain in right ankle and joints of right foot: Secondary | ICD-10-CM | POA: Diagnosis not present

## 2022-09-17 DIAGNOSIS — M6281 Muscle weakness (generalized): Secondary | ICD-10-CM

## 2022-09-17 DIAGNOSIS — M25572 Pain in left ankle and joints of left foot: Secondary | ICD-10-CM

## 2022-09-17 DIAGNOSIS — R262 Difficulty in walking, not elsewhere classified: Secondary | ICD-10-CM

## 2022-09-17 NOTE — Therapy (Addendum)
OUTPATIENT PHYSICAL THERAPY TREATMENT NOTE/DISCHARGE  PHYSICAL THERAPY DISCHARGE SUMMARY  Visits from Start of Care: 3  Current functional level related to goals / functional outcomes: See goals/objective   Remaining deficits: Unable to assess   Education / Equipment: HEP   Patient agrees to discharge. Patient goals were unable to assess. Patient is being discharged due to not returning since the last visit.     Patient Name: Jean [name] MRN: [id number] DOB:1960/05/07, 62 y.o., female Today's Date: 09/18/2022  END OF SESSION:  PT End of Session - 09/17/22 1532     Visit Number 3    Number of Visits 13    Date for PT Re-Evaluation 10/18/22    Authorization Type [employer]    PT Start Time 1532    PT Stop Time 1614    PT Time Calculation (min) 42 min    Activity Tolerance Patient tolerated treatment well    Behavior During Therapy WFL for tasks assessed/performed               Past Medical History:  Diagnosis Date   Anemia    Anxiety    Arthritis    back   Breast cancer (HCC) 08/03/2013   left   Cataract    LEFT FORMING   Dental crown present    Depression    GERD (gastroesophageal reflux disease)    no current med.   Hyperlipidemia    Hypertension    Radiation 01/18/14-03/09/14   Left breast   Past Surgical History:  Procedure Laterality Date   ABDOMINAL HYSTERECTOMY  12/16/2007   FOOT SURGERY Right    Lumpectory Left  August 30, 2013   [facility] REMOVAL Left 01/19/2015   Procedure: MINOR REMOVAL PORT-A-CATH;  Surgeon: Ovidio Kin, MD;  Location: [facility];  Service: General;  Laterality: Left;   PORTACATH PLACEMENT Right 09/26/2013   Procedure: INSERTION PORT-A-CATH;  Surgeon: Kandis Cocking, MD;  Location: [facility];  Service: General;  Laterality: Right;   Patient Active Problem List   Diagnosis Date Noted   Type 2 diabetes mellitus without complication, without long-term current use of insulin (HCC) 04/02/2022   Menopausal depression  12/19/2021   Menopausal syndrome 12/19/2021   Pure hypercholesterolemia 12/19/2021   History of diabetes mellitus 12/19/2021   Gastroesophageal reflux disease 12/19/2021   Ganglion, left wrist 10/14/2021   Lateral epicondylitis of left elbow 06/19/2021   Wrist sprain, left, initial encounter 05/06/2021   Essential hypertension 04/03/2021   Obesity (BMI 30.0-34.9) 04/03/2021   GAD (generalized anxiety disorder) 04/03/2021   Major depressive disorder, single episode, mild (HCC) 04/03/2021   Closed fracture of head of left radius 03/25/2021   Neuropathy 08/18/2020   Breast lump 10/23/2015   Dry skin dermatitis 06/15/2014   Rash 05/18/2014   Anemia 01/10/2014   Symptomatic anemia 01/10/2014   Chest pain of uncertain etiology 12/23/2013   Breast cancer of upper-outer quadrant of left female breast (HCC) 08/12/2013    PCP: Avanell Shackleton, NP-C  REFERRING PROVIDER:   Louann Sjogren, DPM    REFERRING DIAG:  Diagnosis  M72.2 (ICD-10-CM) - Plantar fasciitis, bilateral    THERAPY DIAG:  Pain in right ankle and joints of right foot  Pain in left ankle and joints of left foot  Muscle weakness (generalized)  Difficulty in walking, not elsewhere classified  Rationale for Evaluation and Treatment: Rehabilitation  ONSET DATE: December 2023   SUBJECTIVE:   SUBJECTIVE STATEMENT: "Pain is about a 4 today. Feet have been cold  most of the day. Did some exercises last night because it felt like needles were sticking in them."  PERTINENT HISTORY: History of breast cancer  PAIN:  Are you having pain? Yes: NPRS scale: 4/10 Pain location: plantar aspect of bilateral feet Pain description: stabbing  Aggravating factors: shoes, prolonged standing,walking > 20 minutes  Relieving factors: massage,heat  PRECAUTIONS: None     OBJECTIVE:   DIAGNOSTIC FINDINGS: X-rays Foot:  No acute fractures or dislocations noted. Mild spurring noted at inferior calcaneus.   PATIENT SURVEYS:   FOTO 54% function to 63% predicted   COGNITION: Overall cognitive status: Within functional limits for tasks assessed     SENSATION: WFL  EDEMA:  No obvious swelling about bilateral ankle/foot    POSTURE: pes planus, hallux valgus   PALPATION: TTP bilateral calcaneous, plantar fasica   LOWER EXTREMITY ROM:  Active ROM Right eval Left eval 09/08/22 09/17/22  Hip flexion      Hip extension      Hip abduction      Hip adduction      Hip internal rotation      Hip external rotation      Knee flexion      Passive great toe extension  35 35    Ankle dorsiflexion 2 2 Rt: 2; Lt: 3 Rt:6; Lt: 8  Ankle plantarflexion      Ankle inversion 25 25    Ankle eversion 11 6 pn     (Blank rows = not tested)  LOWER EXTREMITY MMT:  MMT Right eval Left eval  Hip flexion    Hip extension 4 4  Hip abduction 4- 4-  Hip adduction    Hip internal rotation    Hip external rotation    Knee flexion    Knee extension    Ankle dorsiflexion 4 4  Ankle plantarflexion Partial range SL calf raise Partial range SL calf raise   Ankle inversion 4 4  Ankle eversion 4 4   (Blank rows = not tested)  LOWER EXTREMITY SPECIAL TESTS:  (+) Windlass   FUNCTIONAL TESTS:  Squat: limited depth, excessive anterior tibial translation, increased pain  SLS: 23 seconds LLE; 13 seconds RLE  GAIT: Distance walked: 10 ft Assistive device utilized: None Level of assistance: Complete Independence Comments: limited push off bilaterally; maintains RLE in supination during stance. Decreased step length  [facility] Adult PT Treatment:                                                DATE: 09/17/22 Therapeutic Exercise: Gastroc/soleus stretch on wedge x 1 minute each  4 way ankle green band 2 x 10  Seated calf raise 2 x 10  Updated HEP  Manual Therapy: STM/DTM trigger point release gastroc/solues, plantar fascia     [facility] Adult PT Treatment:                                                DATE: 09/08/22 Therapeutic  Exercise: Calf stretch with towel x 30 sec each Great toe flexor stretch x 30 sec each  Ankle circles x 10 each CW/CCW Seated great toe extension attempted Seated ankle rockerboard 2 x 10 A/P  Resisted ankle DF green band x 10  each  Manual Therapy: STM/DTM bilateral gastroc/soleus, plantar fascia Passive gastroc/soleus stretching     [facility] Adult PT Treatment:                                                DATE: 09/03/22 Therapeutic Exercise: Demonstrated and issue initial HEP.    Therapeutic Activity: Education on assessment findings that will be addressed throughout duration of POC.       PATIENT EDUCATION:  Education details: TPDN; HEP update Person educated: Patient Education method: Explanation and Handouts, demo, cues,  Education comprehension: verbalized understanding and needs further education, returned demo, cues   HOME EXERCISE PROGRAM: Access Code: [email or web address] URL: [email or web address] Date: 09/04/2022 Prepared by: Letitia Libra  Exercises - Seated Great Toe Extension  - 1 x daily - 7 x weekly - 2 sets - 10 reps - Seated Self Great Toe Stretch  - 1 x daily - 7 x weekly - 3 sets - 30 sec  hold - Long Sitting Calf Stretch with Strap  - 1 x daily - 7 x weekly - 3 sets - 30 sec  hold - Seated Ankle Circles  - 1 x daily - 7 x weekly - 2 sets - 10 reps  ASSESSMENT:  CLINICAL IMPRESSION: Patient reports moderate plantar foot pain upon arrival. Continued with soft tissue work to the gastroc/soleus and plantar fascia. She continues to have trigger points in bilateral gastroc/soleus. She declined TPDN this session, but is considering this intervention at future sessions. Focused on progression of ankle strengthening with good tolerance. She has difficulty isolating ankle movement with 4 way ankle having tendency to compensate at the knee. Slight improvement in bilateral DF AROM compared to last assessment. HEP was updated to include further strengthening.      OBJECTIVE IMPAIRMENTS: Abnormal gait, decreased activity tolerance, decreased balance, decreased knowledge of condition, difficulty walking, decreased ROM, decreased strength, impaired flexibility, improper body mechanics, postural dysfunction, and pain.   ACTIVITY LIMITATIONS: carrying, lifting, bending, standing, squatting, stairs, and locomotion level  PARTICIPATION LIMITATIONS: shopping and community activity  PERSONAL FACTORS: Age, Fitness, Time since onset of injury/illness/exacerbation, and 3+ comorbidities: see PMH above  are also affecting patient's functional outcome.   REHAB POTENTIAL: Good  CLINICAL DECISION MAKING: Stable/uncomplicated  EVALUATION COMPLEXITY: Low   GOALS: Goals reviewed with patient? Yes  SHORT TERM GOALS: Target date: 09/25/2022   Patient will be independent and compliant with initial HEP.   Baseline: issued at eval Goal status: INITIAL  2.  Patient will demonstrate at least 10 degrees of bilateral ankle DF AROM to improve heel strike during gait cycle.  Baseline: see above Goal status: progressing  3.  Patient will demonstrate at least 45 degrees of bilateral great toe extension PROM to improve gait mechanics.  Baseline: see above  Goal status: INITIAL  4.  Patient will be able to complete full range SL calf raise bilaterally to improve push-off during gait cycle.  Baseline:  Goal status: INITIAL    LONG TERM GOALS: Target date: 10/18/2022    Patient will score at least 63% on FOTO to signify clinically meaningful improvement in functional abilities.   Baseline: see above Goal status: INITIAL  2.  Patient will demonstrate 5/5 bilateral ankle strength to improve gait stability.  Baseline: see above Goal status: INITIAL  3.  Patient will demonstrate at least 4+/5 bilateral hip abductor/extensor  strength to improve stability about the chain with prolonged walking.  Baseline: see above Goal status: INITIAL  4.  Patient will report  pain at worst rated as </=4/10 to reduce her current functional limitations.  Baseline: see above Goal status: INITIAL  5.  Patient will be independent with advanced home program to progress/maintain current level of function.  Baseline: initial HEP issued  Goal status: INITIAL     PLAN:  PT FREQUENCY: 1-2x/week  PT DURATION: 6 weeks  PLANNED INTERVENTIONS: Therapeutic exercises, Therapeutic activity, Neuromuscular re-education, Balance training, Gait training, Patient/Family education, Self Care, Joint mobilization, Aquatic Therapy, Dry Needling, Electrical stimulation, Cryotherapy, Moist heat, Taping, Manual therapy, and Re-evaluation  PLAN FOR NEXT SESSION: review and progress HEP; ankle mobility and strengthening, foot intrinsic strengthening, gait training, calf stretching. Consider TPDN.   Letitia Libra, PT, DPT, ATC 09/18/22 8:34 AM

## 2022-09-23 ENCOUNTER — Ambulatory Visit: Payer: 59

## 2022-09-30 ENCOUNTER — Ambulatory Visit: Payer: 59

## 2022-10-01 ENCOUNTER — Encounter: Payer: Self-pay | Admitting: Family Medicine

## 2022-10-01 ENCOUNTER — Ambulatory Visit (INDEPENDENT_AMBULATORY_CARE_PROVIDER_SITE_OTHER): Payer: 59 | Admitting: Family Medicine

## 2022-10-01 VITALS — BP 112/80 | HR 80 | Temp 97.6°F | Ht 67.0 in | Wt 164.0 lb

## 2022-10-01 DIAGNOSIS — E119 Type 2 diabetes mellitus without complications: Secondary | ICD-10-CM | POA: Diagnosis not present

## 2022-10-01 DIAGNOSIS — Z0001 Encounter for general adult medical examination with abnormal findings: Secondary | ICD-10-CM | POA: Diagnosis not present

## 2022-10-01 DIAGNOSIS — Z853 Personal history of malignant neoplasm of breast: Secondary | ICD-10-CM | POA: Diagnosis not present

## 2022-10-01 DIAGNOSIS — Z1231 Encounter for screening mammogram for malignant neoplasm of breast: Secondary | ICD-10-CM | POA: Insufficient documentation

## 2022-10-01 DIAGNOSIS — Z23 Encounter for immunization: Secondary | ICD-10-CM | POA: Diagnosis not present

## 2022-10-01 DIAGNOSIS — Z1159 Encounter for screening for other viral diseases: Secondary | ICD-10-CM

## 2022-10-01 DIAGNOSIS — R202 Paresthesia of skin: Secondary | ICD-10-CM

## 2022-10-01 DIAGNOSIS — I10 Essential (primary) hypertension: Secondary | ICD-10-CM | POA: Diagnosis not present

## 2022-10-01 DIAGNOSIS — R350 Frequency of micturition: Secondary | ICD-10-CM | POA: Diagnosis not present

## 2022-10-01 DIAGNOSIS — M791 Myalgia, unspecified site: Secondary | ICD-10-CM

## 2022-10-01 DIAGNOSIS — R200 Anesthesia of skin: Secondary | ICD-10-CM | POA: Diagnosis not present

## 2022-10-01 DIAGNOSIS — E7800 Pure hypercholesterolemia, unspecified: Secondary | ICD-10-CM | POA: Diagnosis not present

## 2022-10-01 DIAGNOSIS — F411 Generalized anxiety disorder: Secondary | ICD-10-CM | POA: Diagnosis not present

## 2022-10-01 LAB — LIPID PANEL
Cholesterol: 184 mg/dL (ref 0–200)
HDL: 54.5 mg/dL (ref >39.00)
LDL Cholesterol: 114 mg/dL — ABNORMAL HIGH (ref 0–99)
NonHDL: 129.24
Total CHOL/HDL Ratio: 3
Triglycerides: 75 mg/dL (ref 0.0–149.0)
VLDL: 15 mg/dL (ref 0.0–40.0)

## 2022-10-01 LAB — COMPREHENSIVE METABOLIC PANEL
ALT: 12 U/L (ref 0–35)
AST: 16 U/L (ref 0–37)
Albumin: 4.3 g/dL (ref 3.5–5.2)
Alkaline Phosphatase: 63 U/L (ref 39–117)
BUN: 22 mg/dL (ref 6–23)
CO2: 30 meq/L (ref 19–32)
Calcium: 9.7 mg/dL (ref 8.4–10.5)
Chloride: 103 meq/L (ref 96–112)
Creatinine, Ser: 1.01 mg/dL (ref 0.40–1.20)
GFR: 60.27 mL/min (ref >60.00)
Glucose, Bld: 91 mg/dL (ref 70–99)
Potassium: 3.7 meq/L (ref 3.5–5.1)
Sodium: 140 mEq/L (ref 135–145)
Total Bilirubin: 0.3 mg/dL (ref 0.2–1.2)
Total Protein: 7.6 g/dL (ref 6.0–8.3)

## 2022-10-01 LAB — CBC WITH DIFFERENTIAL/PLATELET
Basophils Absolute: 0 10*3/uL (ref 0.0–0.1)
Basophils Relative: 0.9 % (ref 0.0–3.0)
Eosinophils Absolute: 0.1 10*3/uL (ref 0.0–0.7)
Eosinophils Relative: 1.1 % (ref 0.0–5.0)
HCT: 37.8 % (ref 36.0–46.0)
Hemoglobin: 12 g/dL (ref 12.0–15.0)
Lymphocytes Relative: 36.3 % (ref 12.0–46.0)
Lymphs Abs: 1.8 10*3/uL (ref 0.7–4.0)
MCHC: 31.8 g/dL (ref 30.0–36.0)
MCV: 84.7 fl (ref 78.0–100.0)
Monocytes Absolute: 0.4 10*3/uL (ref 0.1–1.0)
Monocytes Relative: 7.4 % (ref 3.0–12.0)
Neutro Abs: 2.7 10*3/uL (ref 1.4–7.7)
Neutrophils Relative %: 54.3 % (ref 43.0–77.0)
Platelets: 355 10*3/uL (ref 150.0–400.0)
RBC: 4.47 Mil/uL (ref 3.87–5.11)
RDW: 14.2 % (ref 11.5–15.5)
WBC: 4.9 10*3/uL (ref 4.0–10.5)

## 2022-10-01 LAB — CK: Total CK: 113 U/L (ref 7–177)

## 2022-10-01 LAB — HEMOGLOBIN A1C: Hgb A1c MFr Bld: 6.2 % (ref 4.6–6.5)

## 2022-10-01 LAB — MAGNESIUM: Magnesium: 1.9 mg/dL (ref 1.5–2.5)

## 2022-10-01 NOTE — Assessment & Plan Note (Signed)
Referral to OB/GYN 

## 2022-10-01 NOTE — Progress Notes (Signed)
Complete physical exam  Patient: Jean [name]   DOB: 1960/11/28   62 y.o. Female  MRN: [id number]  Subjective:    Chief Complaint  Patient presents with   Annual Exam    Last ate around 8am   She is here for a complete physical exam.  Sees a podiatrist.  [facility]  Total hysterectomy   Eating a healthy diet. Good medication compliance.   Myalgias. Hip pain. Foot pain.   C/o anxiety. Injury at work.  Currently unemployed for the first time.  Seeing a therapist.   Taking magnesium   Single. Grandchildren live with her.    Health Maintenance  Topic Date Due   Eye exam for diabetics  Never done   HIV Screening  Never done   Hepatitis C Screening  Never done   Mammogram  10/18/2017   Hemoglobin A1C  10/01/2022   COVID-19 Vaccine (5 - 2023-24 season) 10/17/2022*   Yearly kidney health urinalysis for diabetes  01/02/2023   Yearly kidney function blood test for diabetes  04/03/2023   Complete foot exam   10/01/2023   DTaP/Tdap/Td vaccine (2 - Td or Tdap) 04/04/2031   Colon Cancer Screening  07/18/2031   Flu Shot  Completed   Zoster (Shingles) Vaccine  Completed   HPV Vaccine  Aged Out  *Topic was postponed. The date shown is not the original due date.    Wears seatbelt always, uses sunscreen, smoke detectors in home and functioning, does not text while driving, feels safe in home environment.  Depression screening:    10/01/2022    3:18 PM 06/16/2022    2:55 PM 04/02/2022    2:57 PM  Depression screen PHQ 2/9  Decreased Interest 1 0 0  Down, Depressed, Hopeless 0 0 0  PHQ - 2 Score 1 0 0  Altered sleeping 0  0  Tired, decreased energy 1  0  Change in appetite 0  0  Feeling bad or failure about yourself  0  0  Trouble concentrating 2  0  Moving slowly or fidgety/restless 0  0  Suicidal thoughts 0  0  PHQ-9 Score 4  0   Anxiety Screening:    07/04/2021    1:58 PM 04/03/2021    2:13 PM  GAD 7 : Generalized Anxiety Score  Nervous, Anxious, on Edge 1  2  Control/stop worrying 2 3  Worry too much - different things 2 3  Trouble relaxing 0 2  Restless 0 1  Easily annoyed or irritable 1 2  Afraid - awful might happen 2 2  Total GAD 7 Score 8 15    Vision:Within last year and Dental: No current dental problems and Receives regular dental care  Patient Active Problem List   Diagnosis Date Noted   Myalgia 10/01/2022   Encounter for general adult medical examination with abnormal findings 10/01/2022   History of left breast cancer 10/01/2022   Numbness and tingling of both legs 10/01/2022   Screening mammogram for breast cancer 10/01/2022   Type 2 diabetes mellitus without complication, without long-term current use of insulin (HCC) 04/02/2022   Menopausal depression 12/19/2021   Menopausal syndrome 12/19/2021   Pure hypercholesterolemia 12/19/2021   History of diabetes mellitus 12/19/2021   Gastroesophageal reflux disease 12/19/2021   Ganglion, left wrist 10/14/2021   Lateral epicondylitis of left elbow 06/19/2021   Wrist sprain, left, initial encounter 05/06/2021   Essential hypertension 04/03/2021   Obesity (BMI 30.0-34.9) 04/03/2021   GAD (generalized anxiety  disorder) 04/03/2021   Major depressive disorder, single episode, mild (HCC) 04/03/2021   Closed fracture of head of left radius 03/25/2021   Neuropathy 08/18/2020   Breast lump 10/23/2015   Dry skin dermatitis 06/15/2014   Rash 05/18/2014   Anemia 01/10/2014   Symptomatic anemia 01/10/2014   Chest pain of uncertain etiology 12/23/2013   Breast cancer of upper-outer quadrant of left female breast (HCC) 08/12/2013   Past Medical History:  Diagnosis Date   Anemia    Anxiety    Arthritis    back   Breast cancer (HCC) 08/03/2013   left   Cataract    LEFT FORMING   Dental crown present    Depression    GERD (gastroesophageal reflux disease)    no current med.   Hyperlipidemia    Hypertension    Radiation 01/18/14-03/09/14   Left breast   Past Surgical  History:  Procedure Laterality Date   ABDOMINAL HYSTERECTOMY  12/16/2007   FOOT SURGERY Right    Lumpectory Left  August 30, 2013   [facility] REMOVAL Left 01/19/2015   Procedure: MINOR REMOVAL PORT-A-CATH;  Surgeon: Ovidio Kin, MD;  Location: [facility];  Service: General;  Laterality: Left;   PORTACATH PLACEMENT Right 09/26/2013   Procedure: INSERTION PORT-A-CATH;  Surgeon: Kandis Cocking, MD;  Location: [facility];  Service: General;  Laterality: Right;   Social History   Tobacco Use   Smoking status: Former    Current packs/day: 0.00    Average packs/day: 0.3 packs/day for 2.5 years (0.6 ttl pk-yrs)    Types: Cigarettes    Start date: 08/04/2008    Quit date: 02/03/2011    Years since quitting: 11.6    Passive exposure: Never   Smokeless tobacco: Never  Vaping Use   Vaping status: Never Used  Substance Use Topics   Alcohol use: Yes    Comment: ONCE A WEEK   Drug use: No      Patient Care Team: Avanell Shackleton, NP-C as PCP - General (Family Medicine) Maisie Fus, MD as PCP - Cardiology (Cardiology) Ovidio Kin, MD as Consulting Physician (General Surgery) Lurline Hare, MD as Consulting Physician (Radiation Oncology) Gretel Acre, MD as Consulting Physician (Family Medicine) Serena Croissant, MD as Consulting Physician (Hematology and Oncology)   Outpatient Medications Prior to Visit  Medication Sig   atorvastatin (LIPITOR) 20 MG tablet Take 1 tablet (20 mg total) by mouth daily.   betamethasone dipropionate 0.05 % cream Apply topically 2 (two) times daily.   Cholecalciferol (VITAMIN D3 PO) Take by mouth daily. 2000 IU   Cyanocobalamin (VITAMIN B-12 PO)    lisinopril-hydrochlorothiazide (ZESTORETIC) 10-12.5 MG tablet TAKE 1 TABLET BY MOUTH EVERY DAY   meloxicam (MOBIC) 15 MG tablet Take 1 tablet (15 mg total) by mouth daily.   metFORMIN (GLUCOPHAGE) 500 MG tablet TAKE 1 TABLET BY MOUTH EVERY DAY WITH BREAKFAST   Multiple Vitamin (MULTIVITAMIN PO) Take  by mouth daily. CENTRUM   venlafaxine XR (EFFEXOR-XR) 75 MG 24 hr capsule TAKE 1 CAPSULE BY MOUTH DAILY WITH BREAKFAST.   [DISCONTINUED] Cyanocobalamin (VITAMIN B-12 SL)    No facility-administered medications prior to visit.    Review of Systems  Constitutional:  Negative for chills and fever.  HENT:  Negative for congestion, ear pain, sinus pain and sore throat.   Eyes:  Negative for blurred vision, double vision and pain.  Respiratory:  Negative for cough, shortness of breath and wheezing.   Cardiovascular:  Negative for chest pain,  palpitations and leg swelling.  Gastrointestinal:  Negative for abdominal pain, constipation, diarrhea, nausea and vomiting.  Genitourinary:  Positive for frequency. Negative for dysuria and urgency.  Musculoskeletal:  Positive for joint pain and myalgias. Negative for back pain.  Skin:  Negative for rash.  Neurological:  Positive for tingling. Negative for dizziness, focal weakness and headaches.  Endo/Heme/Allergies:  Does not bruise/bleed easily.  Psychiatric/Behavioral:  Negative for depression and suicidal ideas. The patient is nervous/anxious.        Objective:    BP 112/80 (BP Location: Left Arm, Patient Position: Sitting, Cuff Size: Large)   Pulse 80   Temp 97.6 F (36.4 C) (Temporal)   Ht 5\' 7"  (1.702 m)   Wt 164 lb (74.4 kg)   LMP 11/03/2008   SpO2 98%   BMI 25.69 kg/m  BP Readings from Last 3 Encounters:  10/01/22 112/80  06/16/22 98/64  04/02/22 122/84   Wt Readings from Last 3 Encounters:  10/01/22 164 lb (74.4 kg)  06/16/22 166 lb (75.3 kg)  04/02/22 183 lb (83 kg)    Physical Exam Constitutional:      General: She is not in acute distress.    Appearance: She is not ill-appearing.  HENT:     Right Ear: Tympanic membrane, ear canal and external ear normal.     Left Ear: Tympanic membrane, ear canal and external ear normal.     Nose: Nose normal.     Mouth/Throat:     Mouth: Mucous membranes are moist.     Pharynx:  Oropharynx is clear.  Eyes:     Extraocular Movements: Extraocular movements intact.     Conjunctiva/sclera: Conjunctivae normal.     Pupils: Pupils are equal, round, and reactive to light.  Neck:     Thyroid: No thyroid mass, thyromegaly or thyroid tenderness.  Cardiovascular:     Rate and Rhythm: Normal rate and regular rhythm.     Pulses: Normal pulses.     Heart sounds: Normal heart sounds.  Pulmonary:     Effort: Pulmonary effort is normal.     Breath sounds: Normal breath sounds.  Abdominal:     General: Bowel sounds are normal.     Palpations: Abdomen is soft.     Tenderness: There is no abdominal tenderness. There is no right CVA tenderness, left CVA tenderness, guarding or rebound.  Musculoskeletal:        General: Normal range of motion.     Cervical back: Normal range of motion and neck supple. No tenderness.     Right lower leg: No edema.     Left lower leg: No edema.  Lymphadenopathy:     Cervical: No cervical adenopathy.  Skin:    General: Skin is warm and dry.     Findings: No lesion or rash.  Neurological:     General: No focal deficit present.     Mental Status: She is alert and oriented to person, place, and time.     Cranial Nerves: No cranial nerve deficit.     Sensory: No sensory deficit.     Motor: No weakness.     Gait: Gait normal.  Psychiatric:        Mood and Affect: Mood normal.        Behavior: Behavior normal.        Thought Content: Thought content normal.      No results found for any visits on 10/01/22.    Assessment & Plan:    Routine  Health Maintenance and Physical Exam  Problem List Items Addressed This Visit       Cardiovascular and Mediastinum   Essential hypertension    Controlled. Continue lisinopril-HCTZ 10-12.5 mg daily Check labs.         Endocrine   Type 2 diabetes mellitus without complication, without long-term current use of insulin (HCC)    Continue metformin, healthy diet and exercise. Check A1c. I will also  screen for B12 deficiency due to metformin.       Relevant Orders   CBC with Differential/Platelet   Comprehensive metabolic panel   Hemoglobin A1c     Other   Encounter for general adult medical examination with abnormal findings - Primary    Preventive health care reviewed.  Counseling on healthy lifestyle including diet and exercise.  Recommend regular dental and eye exams.  Immunizations reviewed.  Discussed safety.       GAD (generalized anxiety disorder)    Continue Effexor 75 mg daily and counseling. Increased stress recently due to change in job status.       History of left breast cancer    Encouraged her to schedule mammogram. She prefers to schedule with an OB/GYN practice instead of at [facility].       Relevant Orders   Ambulatory referral to Obstetrics / Gynecology   Myalgia    Check labs including CK. Consider stopping statin for 2 weeks to see if myalgias improve.       Relevant Orders   Magnesium   CK (Creatine Kinase)   Numbness and tingling of both legs    Check labs.       Relevant Orders   Iron, TIBC and Ferritin Panel   Magnesium   Vitamin B12   Vitamin B1   TSH   T4, free   Pure hypercholesterolemia    Check lipids. Continue statin, low fat, low cholesterol diet and exercise.       Relevant Orders   Lipid panel   Screening mammogram for breast cancer    Referral to OB/GYN      Relevant Orders   Ambulatory referral to Obstetrics / Gynecology   Other Visit Diagnoses     Need for influenza vaccination       Relevant Orders   Flu vaccine trivalent PF, 6mos and older(Flulaval,Afluria,Fluarix,Fluzone) (Completed)   Encounter for screening for other viral diseases       Relevant Orders   Hepatitis C antibody   HIV Antibody (routine testing w rflx)   Urinary frequency       Relevant Orders   Urinalysis, Routine w reflex microscopic       Return in about 6 months (around 04/03/2023).     Hetty Blend, NP-C

## 2022-10-01 NOTE — Assessment & Plan Note (Signed)
Controlled. Continue lisinopril-HCTZ 10-12.5 mg daily Check labs.

## 2022-10-01 NOTE — Assessment & Plan Note (Signed)
Encouraged her to schedule mammogram. She prefers to schedule with an OB/GYN practice instead of at [facility].

## 2022-10-01 NOTE — Assessment & Plan Note (Signed)
Check labs 

## 2022-10-01 NOTE — Assessment & Plan Note (Signed)
Continue metformin, healthy diet and exercise. Check A1c. I will also screen for B12 deficiency due to metformin.

## 2022-10-01 NOTE — Assessment & Plan Note (Signed)
Preventive health care reviewed.  Counseling on healthy lifestyle including diet and exercise.  Recommend regular dental and eye exams.  Immunizations reviewed.  Discussed safety.  

## 2022-10-01 NOTE — Assessment & Plan Note (Signed)
Check labs including CK. Consider stopping statin for 2 weeks to see if myalgias improve.

## 2022-10-01 NOTE — Assessment & Plan Note (Signed)
Check lipids. Continue statin, low fat, low cholesterol diet and exercise.

## 2022-10-01 NOTE — Assessment & Plan Note (Signed)
Continue Effexor 75 mg daily and counseling. Increased stress recently due to change in job status.

## 2022-10-01 NOTE — Patient Instructions (Addendum)
Please go downstairs for labs and a urine test.   I have referred you to an OB/GYN office and you should receive a call in the next week or two. Let me know if you do not.

## 2022-10-02 ENCOUNTER — Other Ambulatory Visit: Payer: Self-pay | Admitting: Family Medicine

## 2022-10-02 LAB — URINALYSIS, ROUTINE W REFLEX MICROSCOPIC
Bilirubin Urine: NEGATIVE
Hgb urine dipstick: NEGATIVE
Ketones, ur: NEGATIVE
Leukocytes,Ua: NEGATIVE
Nitrite: NEGATIVE
RBC / HPF: NONE SEEN (ref 0–?)
Specific Gravity, Urine: >=1.03 — AB (ref 1.000–1.030)
Total Protein, Urine: NEGATIVE
Urine Glucose: NEGATIVE
Urobilinogen, UA: 0.2 (ref 0.0–1.0)
pH: 6 (ref 5.0–8.0)

## 2022-10-02 LAB — T4, FREE: Free T4: 0.89 ng/dL (ref 0.60–1.60)

## 2022-10-02 LAB — HEPATITIS C ANTIBODY: Hepatitis C Ab: NONREACTIVE

## 2022-10-02 LAB — TSH: TSH: 0.64 u[IU]/mL (ref 0.35–5.50)

## 2022-10-02 LAB — IRON,TIBC AND FERRITIN PANEL
%SAT: 19 % (ref 16–45)
Ferritin: 113 ng/mL (ref 16–288)
Iron: 75 ug/dL (ref 45–160)
TIBC: 386 ug/dL (ref 250–450)

## 2022-10-02 LAB — VITAMIN B12: Vitamin B-12: 695 pg/mL (ref 211–911)

## 2022-10-02 LAB — HIV ANTIBODY (ROUTINE TESTING W REFLEX): HIV 1&2 Ab, 4th Generation: NONREACTIVE

## 2022-10-07 LAB — VITAMIN B1: Vitamin B1 (Thiamine): 13 nmol/L (ref 8–30)

## 2022-10-14 ENCOUNTER — Ambulatory Visit: Payer: 59 | Attending: Podiatry

## 2022-10-14 ENCOUNTER — Ambulatory Visit (INDEPENDENT_AMBULATORY_CARE_PROVIDER_SITE_OTHER): Payer: 59 | Admitting: Podiatry

## 2022-10-14 DIAGNOSIS — Z91199 Patient's noncompliance with other medical treatment and regimen due to unspecified reason: Secondary | ICD-10-CM

## 2022-10-14 NOTE — Progress Notes (Signed)
No show

## 2023-01-01 ENCOUNTER — Other Ambulatory Visit: Payer: Self-pay | Admitting: Family Medicine

## 2023-01-01 DIAGNOSIS — E7800 Pure hypercholesterolemia, unspecified: Secondary | ICD-10-CM

## 2023-01-01 DIAGNOSIS — E119 Type 2 diabetes mellitus without complications: Secondary | ICD-10-CM

## 2023-02-25 ENCOUNTER — Other Ambulatory Visit: Payer: Self-pay | Admitting: Family Medicine

## 2023-02-25 DIAGNOSIS — E119 Type 2 diabetes mellitus without complications: Secondary | ICD-10-CM

## 2023-02-25 MED ORDER — LISINOPRIL-HYDROCHLOROTHIAZIDE 10-12.5 MG PO TABS: 1.0000 | ORAL_TABLET | Freq: Every day | ORAL | 1 refills | Status: DC

## 2023-02-25 MED ORDER — METFORMIN HCL 500 MG PO TABS: ORAL_TABLET | ORAL | 1 refills | Status: DC

## 2023-02-25 NOTE — Telephone Encounter (Signed)
Copied from CRM [id number]. Topic: Clinical - Medication Refill >> Feb 25, 2023 12:50 PM Dimitri Ped wrote: Most Recent Primary Care Visit:  Provider: Avanell Shackleton  Department: [facility]  Visit Type: PHYSICAL  Date: 10/01/2022  Medication: metFORMIN (GLUCOPHAGE) 500 MG tablet lisinopril-hydrochlorothiazide (ZESTORETIC) 10-12.5 MG tablet    Has the patient contacted their pharmacy? Yes no more refills have to contact doctor  (Agent: If no, request that the patient contact the pharmacy for the refill. If patient does not wish to contact the pharmacy document the reason why and proceed with request.) (Agent: If yes, when and what did the pharmacy advise?)  Is this the correct pharmacy for this prescription? Yes If no, delete pharmacy and type the correct one.  This is the patient's preferred pharmacy:  [id number] - [place], [place] - [address] AT [facility] [address] [place] [address] Phone: [phone] Fax: [phone]  [facility] [address] [place] [address] Phone: [phone] Fax: [phone]   Has the prescription been filled recently? No  Is the patient out of the medication? Yes metformin and low on the lisinipril  Has the patient been seen for an appointment in the last year OR does the patient have an upcoming appointment? Yes  Can we respond through MyChart? Yes  Agent: Please be advised that Rx refills may take up to 3 business days. We ask that you follow-up with your pharmacy.

## 2023-04-03 ENCOUNTER — Encounter: Payer: Self-pay | Admitting: Family Medicine

## 2023-04-03 ENCOUNTER — Ambulatory Visit: Payer: Medicaid Other | Admitting: Family Medicine

## 2023-04-03 VITALS — BP 114/72 | HR 66 | Temp 97.7°F | Ht 67.0 in | Wt 179.0 lb

## 2023-04-03 DIAGNOSIS — E119 Type 2 diabetes mellitus without complications: Secondary | ICD-10-CM

## 2023-04-03 DIAGNOSIS — R635 Abnormal weight gain: Secondary | ICD-10-CM | POA: Insufficient documentation

## 2023-04-03 DIAGNOSIS — F411 Generalized anxiety disorder: Secondary | ICD-10-CM | POA: Diagnosis not present

## 2023-04-03 DIAGNOSIS — G479 Sleep disorder, unspecified: Secondary | ICD-10-CM

## 2023-04-03 DIAGNOSIS — E1169 Type 2 diabetes mellitus with other specified complication: Secondary | ICD-10-CM | POA: Diagnosis not present

## 2023-04-03 DIAGNOSIS — E785 Hyperlipidemia, unspecified: Secondary | ICD-10-CM

## 2023-04-03 DIAGNOSIS — I10 Essential (primary) hypertension: Secondary | ICD-10-CM

## 2023-04-03 DIAGNOSIS — R5383 Other fatigue: Secondary | ICD-10-CM

## 2023-04-03 DIAGNOSIS — Z7984 Long term (current) use of oral hypoglycemic drugs: Secondary | ICD-10-CM

## 2023-04-03 LAB — CBC WITH DIFFERENTIAL/PLATELET
Basophils Absolute: 0 10*3/uL (ref 0.0–0.1)
Basophils Relative: 0.8 % (ref 0.0–3.0)
Eosinophils Absolute: 0.1 10*3/uL (ref 0.0–0.7)
Eosinophils Relative: 1.6 % (ref 0.0–5.0)
HCT: 36.1 % (ref 36.0–46.0)
Hemoglobin: 11.9 g/dL — ABNORMAL LOW (ref 12.0–15.0)
Lymphocytes Relative: 37.6 % (ref 12.0–46.0)
Lymphs Abs: 1.4 10*3/uL (ref 0.7–4.0)
MCHC: 33 g/dL (ref 30.0–36.0)
MCV: 83.8 fL (ref 78.0–100.0)
Monocytes Absolute: 0.2 10*3/uL (ref 0.1–1.0)
Monocytes Relative: 6.7 % (ref 3.0–12.0)
Neutro Abs: 2 10*3/uL (ref 1.4–7.7)
Neutrophils Relative %: 53.3 % (ref 43.0–77.0)
Platelets: 333 10*3/uL (ref 150.0–400.0)
RBC: 4.3 Mil/uL (ref 3.87–5.11)
RDW: 14.9 % (ref 11.5–15.5)
WBC: 3.7 10*3/uL — ABNORMAL LOW (ref 4.0–10.5)

## 2023-04-03 LAB — MICROALBUMIN / CREATININE URINE RATIO
Creatinine,U: 58.9 mg/dL
Microalb Creat Ratio: 11.9 mg/g (ref 0.0–30.0)
Microalb, Ur: <0.7 mg/dL (ref 0.0–1.9)

## 2023-04-03 LAB — COMPREHENSIVE METABOLIC PANEL
ALT: 14 U/L (ref 0–35)
AST: 19 U/L (ref 0–37)
Albumin: 4.4 g/dL (ref 3.5–5.2)
Alkaline Phosphatase: 60 U/L (ref 39–117)
BUN: 14 mg/dL (ref 6–23)
CO2: 28 meq/L (ref 19–32)
Calcium: 9.1 mg/dL (ref 8.4–10.5)
Chloride: 103 meq/L (ref 96–112)
Creatinine, Ser: 0.84 mg/dL (ref 0.40–1.20)
GFR: 74.93 mL/min (ref >60.00)
Glucose, Bld: 99 mg/dL (ref 70–99)
Potassium: 3.7 meq/L (ref 3.5–5.1)
Sodium: 139 meq/L (ref 135–145)
Total Bilirubin: 0.4 mg/dL (ref 0.2–1.2)
Total Protein: 7.6 g/dL (ref 6.0–8.3)

## 2023-04-03 LAB — LIPID PANEL
Cholesterol: 162 mg/dL (ref 0–200)
HDL: 51.4 mg/dL (ref >39.00)
LDL Cholesterol: 98 mg/dL (ref 0–99)
NonHDL: 110.78
Total CHOL/HDL Ratio: 3
Triglycerides: 66 mg/dL (ref 0.0–149.0)
VLDL: 13.2 mg/dL (ref 0.0–40.0)

## 2023-04-03 LAB — VITAMIN D 25 HYDROXY (VIT D DEFICIENCY, FRACTURES): VITD: 54.34 ng/mL (ref 30.00–100.00)

## 2023-04-03 LAB — HEMOGLOBIN A1C: Hgb A1c MFr Bld: 6.4 % (ref 4.6–6.5)

## 2023-04-03 LAB — VITAMIN B12: Vitamin B-12: 602 pg/mL (ref 211–911)

## 2023-04-03 LAB — T4, FREE: Free T4: 0.91 ng/dL (ref 0.60–1.60)

## 2023-04-03 LAB — TSH: TSH: 0.77 u[IU]/mL (ref 0.35–5.50)

## 2023-04-03 NOTE — Assessment & Plan Note (Signed)
 Continue Effexor 75 mg daily.  Increase exercise

## 2023-04-03 NOTE — Assessment & Plan Note (Signed)
 Recent cravings of sugar and carbohydrates.  Check labs including thyroid function.  Encouraged her to be more active.  Hydrate and cut back on sugar and carbohydrates

## 2023-04-03 NOTE — Assessment & Plan Note (Signed)
 Continue metformin, work on healthy diet and increase exercise. Check A1c. I will also screen for B12 deficiency due to metformin.  Scheduled for diabetic eye exam.  Check urine microalbumin.

## 2023-04-03 NOTE — Patient Instructions (Addendum)
 As we discussed, good sleep hygiene is important.  Be sure to have a dark, cool and quiet room when you try to go to sleep.  You can try melatonin if you would like.  Continue using the steady sound machine or fan. Let me know if this is not improving  Please go downstairs for labs before you leave.  Continue your current medications.  Increase your water intake.  Reduce sugar and carbohydrates.  Limit white food (potatoes, bread, pasta, rice)  Try to get at least 150 minutes of physical activity each week.  Be sure to see your OB/GYN and have your mammogram.  Also remember to get your eye exam as scheduled.

## 2023-04-03 NOTE — Assessment & Plan Note (Signed)
 Continue statin therapy.  Cut back on foods high in saturated fat, fried foods and increase physical activity.  Check fasting lipids and follow-up.

## 2023-04-03 NOTE — Assessment & Plan Note (Signed)
Controlled. Continue lisinopril-HCTZ 10-12.5 mg daily Check labs.

## 2023-04-03 NOTE — Assessment & Plan Note (Signed)
 In-depth counseling on good sleep hygiene.  Using a steady noise such as a fan seems to help.  Encouraged developing a nighttime routine and avoid laying in bed more than 30 minutes if she is not asleep.  Avoid turning on TV when she wakes up in the melanite.  Tried melatonin.  Check labs and follow-up

## 2023-04-03 NOTE — Progress Notes (Signed)
 Subjective:     Patient ID: Jean [name], female    DOB: 03-11-1960, 63 y.o.   MRN: [id number]  Chief Complaint  Patient presents with   Medical Management of Chronic Issues    6 month f/u    HPI   History of Present Illness         Here to follow up on chronic health conditions.   Sees a podiatrist.  [facility] - has an appt  Total hysterectomy   Diabetes   She is having trouble falling asleep. Is not taking naps.    She used to take Ambien   She was on gabapentin for neuropathy   Mood is better   States she is not working any longer.  She is helping out with her 79 year old granddaughter   Gained weight     Health Maintenance Due  Topic Date Due   OPHTHALMOLOGY EXAM  Never done   MAMMOGRAM  10/18/2017   Diabetic kidney evaluation - Urine ACR  01/02/2023   HEMOGLOBIN A1C  04/03/2023    Past Medical History:  Diagnosis Date   Anemia    Anxiety    Arthritis    back   Breast cancer (HCC) 08/03/2013   left   Cataract    LEFT FORMING   Dental crown present    Depression    GERD (gastroesophageal reflux disease)    no current med.   Hyperlipidemia    Hypertension    Radiation 01/18/14-03/09/14   Left breast    Past Surgical History:  Procedure Laterality Date   ABDOMINAL HYSTERECTOMY  12/16/2007   FOOT SURGERY Right    Lumpectory Left  August 30, 2013   [facility] REMOVAL Left 01/19/2015   Procedure: MINOR REMOVAL PORT-A-CATH;  Surgeon: Ovidio Kin, MD;  Location: [facility];  Service: General;  Laterality: Left;   PORTACATH PLACEMENT Right 09/26/2013   Procedure: INSERTION PORT-A-CATH;  Surgeon: Kandis Cocking, MD;  Location: [facility];  Service: General;  Laterality: Right;    Family History  Problem Relation Age of Onset   Hypertension Mother    Diabetes Mother    Hypertension Father    Heart failure Father    Heart attack Father    Breast cancer Paternal Aunt    Cervical cancer Paternal Aunt    Colon cancer  Paternal Uncle    Prostate cancer Paternal Uncle    Crohn's disease Neg Hx    Esophageal cancer Neg Hx    Rectal cancer Neg Hx    Stomach cancer Neg Hx     Social History   Socioeconomic History   Marital status: Divorced    Spouse name: Not on file   Number of children: Not on file   Years of education: Not on file   Highest education level: Associate degree: occupational, Scientist, product/process development, or vocational program  Occupational History   Not on file  Tobacco Use   Smoking status: Former    Current packs/day: 0.00    Average packs/day: 0.3 packs/day for 2.5 years (0.6 ttl pk-yrs)    Types: Cigarettes    Start date: 08/04/2008    Quit date: 02/03/2011    Years since quitting: 12.1    Passive exposure: Never   Smokeless tobacco: Never  Vaping Use   Vaping status: Never Used  Substance and Sexual Activity   Alcohol use: Yes    Comment: ONCE A WEEK   Drug use: No   Sexual activity: Yes  Other Topics Concern   Not on file  Social History Narrative   Not on file   Social Drivers of Health   Financial Resource Strain: Patient Declined (03/30/2023)   Overall Financial Resource Strain (CARDIA)    Difficulty of Paying Living Expenses: Patient declined  Food Insecurity: Patient Declined (03/30/2023)   Hunger Vital Sign    Worried About Running Out of Food in the Last Year: Patient declined    Ran Out of Food in the Last Year: Patient declined  Transportation Needs: No Transportation Needs (03/30/2023)   PRAPARE - Administrator, Civil Service (Medical): No    Lack of Transportation (Non-Medical): No  Physical Activity: Insufficiently Active (03/30/2023)   Exercise Vital Sign    Days of Exercise per Week: 3 days    Minutes of Exercise per Session: 40 min  Stress: Stress Concern Present (03/30/2023)   [employer] of Occupational Health - Occupational Stress Questionnaire    Feeling of Stress : To some extent  Social Connections: Moderately Isolated (03/30/2023)    Social Connection and Isolation Panel [NHANES]    Frequency of Communication with Friends and Family: Once a week    Frequency of Social Gatherings with Friends and Family: Once a week    Attends Religious Services: More than 4 times per year    Active Member of [employer] or Organizations: No    Attends Engineer, structural: More than 4 times per year    Marital Status: Divorced  Catering manager Violence: Not on file    Outpatient Medications Prior to Visit  Medication Sig Dispense Refill   atorvastatin (LIPITOR) 20 MG tablet TAKE 1 TABLET BY MOUTH EVERY DAY 90 tablet 3   betamethasone dipropionate 0.05 % cream Apply topically 2 (two) times daily. 45 g 0   Cholecalciferol (VITAMIN D3 PO) Take by mouth daily. 2000 IU     Cyanocobalamin (VITAMIN B-12 PO)      lisinopril-hydrochlorothiazide (ZESTORETIC) 10-12.5 MG tablet Take 1 tablet by mouth daily. 90 tablet 1   meloxicam (MOBIC) 15 MG tablet Take 1 tablet (15 mg total) by mouth daily. 30 tablet 1   metFORMIN (GLUCOPHAGE) 500 MG tablet TAKE 1 TABLET BY MOUTH EVERY DAY WITH BREAKFAST 90 tablet 1   Multiple Vitamin (MULTIVITAMIN PO) Take by mouth daily. CENTRUM     venlafaxine XR (EFFEXOR-XR) 75 MG 24 hr capsule TAKE 1 CAPSULE BY MOUTH DAILY WITH BREAKFAST. 90 capsule 1   No facility-administered medications prior to visit.    Allergies  Allergen Reactions   Hydrocodone Other (See Comments)    Makes very constipated     Review of Systems  Constitutional:  Positive for malaise/fatigue. Negative for chills, fever and weight loss.  Eyes:  Negative for blurred vision and double vision.  Respiratory:  Negative for shortness of breath.   Cardiovascular:  Negative for chest pain, palpitations and leg swelling.  Gastrointestinal:  Negative for abdominal pain, constipation, diarrhea, nausea and vomiting.  Genitourinary:  Negative for dysuria, frequency and urgency.  Musculoskeletal:  Positive for joint pain.       Foot pain   Neurological:  Negative for dizziness, focal weakness and headaches.  Psychiatric/Behavioral:  Positive for depression. Negative for suicidal ideas. The patient is nervous/anxious and has insomnia.        Objective:    Physical Exam Constitutional:      General: She is not in acute distress.    Appearance: She is not ill-appearing.  Eyes:  Extraocular Movements: Extraocular movements intact.     Conjunctiva/sclera: Conjunctivae normal.  Cardiovascular:     Rate and Rhythm: Normal rate and regular rhythm.  Pulmonary:     Effort: Pulmonary effort is normal.     Breath sounds: Normal breath sounds.  Musculoskeletal:     Cervical back: Normal range of motion and neck supple.  Skin:    General: Skin is warm and dry.  Neurological:     General: No focal deficit present.     Mental Status: She is alert and oriented to person, place, and time.     Motor: No weakness.     Coordination: Coordination normal.     Gait: Gait normal.  Psychiatric:        Mood and Affect: Mood normal.        Behavior: Behavior normal.        Thought Content: Thought content normal.      BP 114/72 (BP Location: Left Arm, Patient Position: Sitting)   Pulse 66   Temp 97.7 F (36.5 C) (Temporal)   Ht 5\' 7"  (1.702 m)   Wt 179 lb (81.2 kg)   LMP 11/03/2008   SpO2 99%   BMI 28.04 kg/m  Wt Readings from Last 3 Encounters:  04/03/23 179 lb (81.2 kg)  10/01/22 164 lb (74.4 kg)  06/16/22 166 lb (75.3 kg)       Assessment & Plan:   Problem List Items Addressed This Visit     Essential hypertension   Controlled. Continue lisinopril-HCTZ 10-12.5 mg daily Check labs.       Relevant Orders   CBC with Differential/Platelet   Comprehensive metabolic panel   Fatigue   Most likely associated with sleep disturbances.  Check labs to look for underlying etiology.  Counseling on good sleep hygiene      Relevant Orders   CBC with Differential/Platelet   Comprehensive metabolic panel   TSH   T4,  free   Vitamin B12   VITAMIN D 25 Hydroxy (Vit-D Deficiency, Fractures)   GAD (generalized anxiety disorder)   Continue Effexor 75 mg daily.  Increase exercise      Hyperlipidemia associated with type 2 diabetes mellitus (HCC)   Continue statin therapy.  Cut back on foods high in saturated fat, fried foods and increase physical activity.  Check fasting lipids and follow-up.      Relevant Orders   Lipid panel   Sleep disturbances   In-depth counseling on good sleep hygiene.  Using a steady noise such as a fan seems to help.  Encouraged developing a nighttime routine and avoid laying in bed more than 30 minutes if she is not asleep.  Avoid turning on TV when she wakes up in the melanite.  Tried melatonin.  Check labs and follow-up      Relevant Orders   TSH   T4, free   Type 2 diabetes mellitus without complication, without long-term current use of insulin (HCC) - Primary   Continue metformin, work on healthy diet and increase exercise. Check A1c. I will also screen for B12 deficiency due to metformin.  Scheduled for diabetic eye exam.  Check urine microalbumin.      Relevant Orders   Hemoglobin A1c   Microalbumin / creatinine urine ratio   CBC with Differential/Platelet   Comprehensive metabolic panel   Vitamin B12   Weight gain   Recent cravings of sugar and carbohydrates.  Check labs including thyroid function.  Encouraged her to be more active.  Hydrate and cut back on sugar and carbohydrates      Relevant Orders   CBC with Differential/Platelet   Comprehensive metabolic panel   TSH   T4, free    I am having Britt Boozer maintain her Multiple Vitamin (MULTIVITAMIN PO), Cholecalciferol (VITAMIN D3 PO), betamethasone dipropionate, venlafaxine XR, meloxicam, Cyanocobalamin (VITAMIN B-12 PO), atorvastatin, metFORMIN, and lisinopril-hydrochlorothiazide.  No orders of the defined types were placed in this encounter.

## 2023-04-03 NOTE — Assessment & Plan Note (Signed)
 Most likely associated with sleep disturbances.  Check labs to look for underlying etiology.  Counseling on good sleep hygiene

## 2023-04-11 ENCOUNTER — Other Ambulatory Visit: Payer: Self-pay | Admitting: Family Medicine

## 2023-04-11 DIAGNOSIS — F411 Generalized anxiety disorder: Secondary | ICD-10-CM

## 2023-04-13 NOTE — Telephone Encounter (Signed)
 LOV: 04/03/23 Last fill: 07/15/22, 90 w 1 refill

## 2023-04-27 ENCOUNTER — Encounter: Payer: Medicaid Other | Admitting: Obstetrics & Gynecology

## 2023-05-04 ENCOUNTER — Encounter: Payer: Self-pay | Admitting: Obstetrics & Gynecology

## 2023-05-04 ENCOUNTER — Ambulatory Visit (INDEPENDENT_AMBULATORY_CARE_PROVIDER_SITE_OTHER): Admitting: Obstetrics & Gynecology

## 2023-05-04 VITALS — BP 126/89 | HR 61 | Wt 177.0 lb

## 2023-05-04 DIAGNOSIS — Z800 Family history of malignant neoplasm of digestive organs: Secondary | ICD-10-CM

## 2023-05-04 DIAGNOSIS — Z1231 Encounter for screening mammogram for malignant neoplasm of breast: Secondary | ICD-10-CM

## 2023-05-04 DIAGNOSIS — Z803 Family history of malignant neoplasm of breast: Secondary | ICD-10-CM

## 2023-05-04 DIAGNOSIS — Z853 Personal history of malignant neoplasm of breast: Secondary | ICD-10-CM | POA: Diagnosis not present

## 2023-05-04 DIAGNOSIS — Z01419 Encounter for gynecological examination (general) (routine) without abnormal findings: Secondary | ICD-10-CM | POA: Diagnosis not present

## 2023-05-04 DIAGNOSIS — Z8041 Family history of malignant neoplasm of ovary: Secondary | ICD-10-CM

## 2023-05-04 NOTE — Progress Notes (Signed)
 ANNUAL PREVENTATIVE CARE GYNECOLOGY  ENCOUNTER NOTE  Subjective:       Jean [name] is a 63 y.o. divorced  P2 here for a routine annual gynecologic exam. She is new to this practice. The patient is not currently sexually active. The patient is not taking hormone replacement therapy. Patient denies post-menopausal vaginal bleeding. The patient wears seatbelts: yes. The patient reports that there is not domestic violence in her life.  Current complaints: 1.  She has no gyn complaints at this time. She had a TAH in the early 2010s with Dr. Senaida Ores, thinks the ovaries were also removed.     Gynecologic History Patient's last menstrual period was 11/03/2008.  Last mammogram: normal. Results were: normal Last Colonoscopy:  Last Dexa Scan:    Obstetric History OB History  Gravida Para Term Preterm AB Living  2       SAB IAB Ectopic Multiple Live Births      2    # Outcome Date GA Lbr Len/2nd Weight Sex Type Anes PTL Lv  2 Gravida           1 Gravida             Past Medical History:  Diagnosis Date   Anemia    Anxiety    Arthritis    back   Breast cancer (HCC) 08/03/2013   left   Cataract    LEFT FORMING   Dental crown present    Depression    GERD (gastroesophageal reflux disease)    no current med.   Hyperlipidemia    Hypertension    Radiation 01/18/14-03/09/14   Left breast    Family History  Problem Relation Age of Onset   Hypertension Mother    Diabetes Mother    Hypertension Father    Heart failure Father    Heart attack Father    Breast cancer Paternal Aunt    Cervical cancer Paternal Aunt    Colon cancer Paternal Uncle    Prostate cancer Paternal Uncle    Crohn's disease Neg Hx    Esophageal cancer Neg Hx    Rectal cancer Neg Hx    Stomach cancer Neg Hx     Past Surgical History:  Procedure Laterality Date   ABDOMINAL HYSTERECTOMY  12/16/2007   FOOT SURGERY Right    Lumpectory Left  08/30/2013   PORT-A-CATH REMOVAL Left 01/19/2015    Procedure: MINOR REMOVAL PORT-A-CATH;  Surgeon: Ovidio Kin, MD;  [facility];  Service: General;  Laterality: Left;   PORTACATH PLACEMENT Right 09/26/2013   Procedure: INSERTION PORT-A-CATH;  Surgeon: Kandis Cocking, MD;  Location: [facility];  Service: General;  Laterality: Right;    Social History   Socioeconomic History   Marital status: Divorced    Spouse name: Not on file   Number of children: Not on file   Years of education: Not on file   Highest education level: Associate degree: occupational, Scientist, product/process development, or vocational program  Occupational History   Not on file  Tobacco Use   Smoking status: Former    Current packs/day: 0.00    Average packs/day: 0.3 packs/day for 2.5 years (0.6 ttl pk-yrs)    Types: Cigarettes    Start date: 08/04/2008    Quit date: 02/03/2011    Years since quitting: 12.2    Passive exposure: Never   Smokeless tobacco: Never  Vaping Use   Vaping status: Never Used  Substance and Sexual Activity   Alcohol use: Yes  Comment: ONCE A WEEK   Drug use: No   Sexual activity: Not Currently  Other Topics Concern   Not on file  Social History Narrative   Not on file   Social Drivers of Health   Financial Resource Strain: Patient Declined (03/30/2023)   Overall Financial Resource Strain (CARDIA)    Difficulty of Paying Living Expenses: Patient declined  Food Insecurity: No Food Insecurity (05/04/2023)   Hunger Vital Sign    Worried About Running Out of Food in the Last Year: Never true    Ran Out of Food in the Last Year: Never true  Transportation Needs: No Transportation Needs (03/30/2023)   PRAPARE - Administrator, Civil Service (Medical): No    Lack of Transportation (Non-Medical): No  Physical Activity: Insufficiently Active (03/30/2023)   Exercise Vital Sign    Days of Exercise per Week: 3 days    Minutes of Exercise per Session: 40 min  Stress: Stress Concern Present (03/30/2023)   [employer] of  Occupational Health - Occupational Stress Questionnaire    Feeling of Stress : To some extent  Social Connections: Moderately Isolated (03/30/2023)   Social Connection and Isolation Panel [NHANES]    Frequency of Communication with Friends and Family: Once a week    Frequency of Social Gatherings with Friends and Family: Once a week    Attends Religious Services: More than 4 times per year    Active Member of [employer] or Organizations: No    Attends Engineer, structural: More than 4 times per year    Marital Status: Divorced  Catering manager Violence: Not on file    Current Outpatient Medications on File Prior to Visit  Medication Sig Dispense Refill   atorvastatin (LIPITOR) 20 MG tablet TAKE 1 TABLET BY MOUTH EVERY DAY 90 tablet 3   Cholecalciferol (VITAMIN D3 PO) Take by mouth daily. 2000 IU     Cyanocobalamin (VITAMIN B-12 PO)      lisinopril-hydrochlorothiazide (ZESTORETIC) 10-12.5 MG tablet Take 1 tablet by mouth daily. 90 tablet 1   metFORMIN (GLUCOPHAGE) 500 MG tablet TAKE 1 TABLET BY MOUTH EVERY DAY WITH BREAKFAST 90 tablet 1   Multiple Vitamin (MULTIVITAMIN PO) Take by mouth daily. CENTRUM     venlafaxine XR (EFFEXOR-XR) 75 MG 24 hr capsule TAKE 1 CAPSULE BY MOUTH DAILY WITH BREAKFAST. 90 capsule 1   betamethasone dipropionate 0.05 % cream Apply topically 2 (two) times daily. (Patient not taking: Reported on 05/04/2023) 45 g 0   meloxicam (MOBIC) 15 MG tablet Take 1 tablet (15 mg total) by mouth daily. (Patient not taking: Reported on 05/04/2023) 30 tablet 1   No current facility-administered medications on file prior to visit.    Allergies  Allergen Reactions   Hydrocodone Other (See Comments)    Makes very constipated       Review of Systems ROS Review of Systems - General ROS: negative for - chills, fatigue, fever, hot flashes, night sweats, weight gain or weight loss Psychological ROS: negative for - anxiety, decreased libido, depression, mood swings, physical  abuse or sexual abuse Ophthalmic ROS: negative for - blurry vision, eye pain or loss of vision ENT ROS: negative for - headaches, hearing change, visual changes or vocal changes Allergy and Immunology ROS: negative for - hives, itchy/watery eyes or seasonal allergies Hematological and Lymphatic ROS: negative for - bleeding problems, bruising, swollen lymph nodes or weight loss Endocrine ROS: negative for - galactorrhea, hair pattern changes, hot flashes, malaise/lethargy, mood swings,  palpitations, polydipsia/polyuria, skin changes, temperature intolerance or unexpected weight changes Breast ROS: negative for - new or changing breast lumps or nipple discharge Respiratory ROS: negative for - cough or shortness of breath Cardiovascular ROS: negative for - chest pain, irregular heartbeat, palpitations or shortness of breath Gastrointestinal ROS: no abdominal pain, change in bowel habits, or black or bloody stools Genito-Urinary ROS: no dysuria, trouble voiding, or hematuria Musculoskeletal ROS: negative for - joint pain or joint stiffness Neurological ROS: negative for - bowel and bladder control changes Dermatological ROS: negative for rash and skin lesion changes   Objective:   BP 126/89   Pulse 61   Wt 177 lb (80.3 kg)   LMP 11/03/2008   BMI 27.72 kg/m  CONSTITUTIONAL: Well-developed, well-nourished female in no acute distress.  PSYCHIATRIC: Normal mood and affect. Normal behavior. Normal judgment and thought content. NEUROLGIC: Alert and oriented to person, place, and time. Normal muscle tone coordination. No cranial nerve deficit noted. HENT:  Normocephalic, atraumatic, External right and left ear normal. Oropharynx is clear and moist EYES: Conjunctivae and EOM are normal. Pupils are equal, round, and reactive to light. No scleral icterus.  NECK: Normal range of motion, supple, no masses.  Normal thyroid.  SKIN: Skin is warm and dry. No rash noted. Not diaphoretic. No erythema. No  pallor. CARDIOVASCULAR: Normal heart rate noted, regular rhythm, no murmur. RESPIRATORY: Clear to auscultation bilaterally. Effort and breath sounds normal, no problems with respiration noted. BREASTS: Symmetric in size. No masses, skin changes, nipple drainage, or lymphadenopathy. ABDOMEN: Soft, normal bowel sounds, no distention noted.  No tenderness, rebound or guarding.  BLADDER: Normal PELVIC:  Bladder no bladder distension noted  Urethra: normal appearing urethra with no masses, tenderness or lesions  Vulva: normal appearing vulva with no masses, tenderness or lesions  Vagina: normal appearance  Bimanual exam reveals no masses or tenderness.    MUSCULOSKELETAL: Normal range of motion. No tenderness.  No cyanosis, clubbing, or edema.  2+ distal pulses. LYMPHATIC: No Axillary, Supraclavicular, or Inguinal Adenopathy.   Labs: Lab Results  Component Value Date   WBC 3.7 (L) 04/03/2023   HGB 11.9 (L) 04/03/2023   HCT 36.1 04/03/2023   MCV 83.8 04/03/2023   PLT 333.0 04/03/2023    Lab Results  Component Value Date   CREATININE 0.84 04/03/2023   BUN 14 04/03/2023   NA 139 04/03/2023   K 3.7 04/03/2023   CL 103 04/03/2023   CO2 28 04/03/2023    Lab Results  Component Value Date   ALT 14 04/03/2023   AST 19 04/03/2023   ALKPHOS 60 04/03/2023   BILITOT 0.4 04/03/2023    Lab Results  Component Value Date   CHOL 162 04/03/2023   HDL 51.40 04/03/2023   LDLCALC 98 04/03/2023   TRIG 66.0 04/03/2023   CHOLHDL 3 04/03/2023    Lab Results  Component Value Date   TSH 0.77 04/03/2023    Lab Results  Component Value Date   HGBA1C 6.4 04/03/2023     Assessment:   1. Well woman exam with routine gynecological exam   2. Encounter for screening mammogram for malignant neoplasm of breast   3. Family history of colon cancer   4. Family history of breast cancer   5. Family history of ovarian cancer   6. Family history of pancreatic cancer   7. Personal history of  malignant neoplasm of breast   8. Family history of malignant neoplasm of breast   9. Family history of malignant  neoplasm of ovary   10. Family history of malignant neoplasm of digestive organ      Plan:  Mammogram ordered and encouraged Due to her FH, I ordered after discussing this with her [facility] genetic testing. Pap not indicated. Return to Clinic - 1 Year   Allie Bossier, MD [facility]

## 2023-05-04 NOTE — Progress Notes (Signed)
 Anxiety level 12. Last mammo 2015. L fibroadenoma. R 2017 benign.Colonoscopy done 2023. No hx bone density. Last pap 2013. Denies problems today.

## 2023-05-11 ENCOUNTER — Other Ambulatory Visit: Payer: Self-pay | Admitting: Family

## 2023-05-11 ENCOUNTER — Encounter: Payer: Self-pay | Admitting: Family Medicine

## 2023-05-11 MED ORDER — SIMVASTATIN 20 MG PO TABS: 20.0000 mg | ORAL_TABLET | Freq: Every day | ORAL | 3 refills | Status: DC

## 2023-05-13 ENCOUNTER — Ambulatory Visit
Admission: RE | Admit: 2023-05-13 | Discharge: 2023-05-13 | Disposition: A | Discharge status: Home / Self Care | Source: Ambulatory Visit | Attending: Obstetrics & Gynecology | Admitting: Obstetrics & Gynecology

## 2023-05-13 DIAGNOSIS — Z1231 Encounter for screening mammogram for malignant neoplasm of breast: Secondary | ICD-10-CM | POA: Diagnosis not present

## 2023-05-15 LAB — EMPOWER BRCA (2): REPORT SUMMARY: NEGATIVE

## 2023-07-22 ENCOUNTER — Ambulatory Visit: Admitting: Family Medicine

## 2023-07-22 ENCOUNTER — Encounter: Payer: Self-pay | Admitting: Family Medicine

## 2023-07-22 VITALS — BP 114/78 | HR 75 | Temp 97.8°F | Ht 67.0 in | Wt 177.0 lb

## 2023-07-22 DIAGNOSIS — Z7984 Long term (current) use of oral hypoglycemic drugs: Secondary | ICD-10-CM | POA: Diagnosis not present

## 2023-07-22 DIAGNOSIS — E785 Hyperlipidemia, unspecified: Secondary | ICD-10-CM

## 2023-07-22 DIAGNOSIS — M79671 Pain in right foot: Secondary | ICD-10-CM

## 2023-07-22 DIAGNOSIS — R252 Cramp and spasm: Secondary | ICD-10-CM | POA: Diagnosis not present

## 2023-07-22 DIAGNOSIS — R202 Paresthesia of skin: Secondary | ICD-10-CM

## 2023-07-22 DIAGNOSIS — E1169 Type 2 diabetes mellitus with other specified complication: Secondary | ICD-10-CM

## 2023-07-22 DIAGNOSIS — M79672 Pain in left foot: Secondary | ICD-10-CM | POA: Diagnosis not present

## 2023-07-22 DIAGNOSIS — I10 Essential (primary) hypertension: Secondary | ICD-10-CM

## 2023-07-22 DIAGNOSIS — G629 Polyneuropathy, unspecified: Secondary | ICD-10-CM | POA: Diagnosis not present

## 2023-07-22 DIAGNOSIS — E119 Type 2 diabetes mellitus without complications: Secondary | ICD-10-CM

## 2023-07-22 LAB — COMPREHENSIVE METABOLIC PANEL WITH GFR
ALT: 16 U/L (ref 0–35)
AST: 20 U/L (ref 0–37)
Albumin: 4.3 g/dL (ref 3.5–5.2)
Alkaline Phosphatase: 68 U/L (ref 39–117)
BUN: 14 mg/dL (ref 6–23)
CO2: 30 meq/L (ref 19–32)
Calcium: 9.4 mg/dL (ref 8.4–10.5)
Chloride: 101 meq/L (ref 96–112)
Creatinine, Ser: 0.78 mg/dL (ref 0.40–1.20)
GFR: 81.72 mL/min (ref >60.00)
Glucose, Bld: 102 mg/dL — ABNORMAL HIGH (ref 70–99)
Potassium: 3.7 meq/L (ref 3.5–5.1)
Sodium: 137 meq/L (ref 135–145)
Total Bilirubin: 0.3 mg/dL (ref 0.2–1.2)
Total Protein: 7.8 g/dL (ref 6.0–8.3)

## 2023-07-22 LAB — CBC WITH DIFFERENTIAL/PLATELET
Basophils Absolute: 0 10*3/uL (ref 0.0–0.1)
Basophils Relative: 0.7 % (ref 0.0–3.0)
Eosinophils Absolute: 0.1 10*3/uL (ref 0.0–0.7)
Eosinophils Relative: 1.7 % (ref 0.0–5.0)
HCT: 36.8 % (ref 36.0–46.0)
Hemoglobin: 12 g/dL (ref 12.0–15.0)
Lymphocytes Relative: 36.8 % (ref 12.0–46.0)
Lymphs Abs: 1.3 10*3/uL (ref 0.7–4.0)
MCHC: 32.7 g/dL (ref 30.0–36.0)
MCV: 83.3 fl (ref 78.0–100.0)
Monocytes Absolute: 0.2 10*3/uL (ref 0.1–1.0)
Monocytes Relative: 7.2 % (ref 3.0–12.0)
Neutro Abs: 1.8 10*3/uL (ref 1.4–7.7)
Neutrophils Relative %: 53.6 % (ref 43.0–77.0)
Platelets: 371 10*3/uL (ref 150.0–400.0)
RBC: 4.41 Mil/uL (ref 3.87–5.11)
RDW: 14.1 % (ref 11.5–15.5)
WBC: 3.4 10*3/uL — ABNORMAL LOW (ref 4.0–10.5)

## 2023-07-22 LAB — FERRITIN: Ferritin: 147 ng/mL (ref 10.0–291.0)

## 2023-07-22 LAB — LIPID PANEL
Cholesterol: 179 mg/dL (ref 0–200)
HDL: 50.8 mg/dL (ref >39.00)
LDL Cholesterol: 113 mg/dL — ABNORMAL HIGH (ref 0–99)
NonHDL: 128.14
Total CHOL/HDL Ratio: 4
Triglycerides: 78 mg/dL (ref 0.0–149.0)
VLDL: 15.6 mg/dL (ref 0.0–40.0)

## 2023-07-22 LAB — VITAMIN B12: Vitamin B-12: 679 pg/mL (ref 211–911)

## 2023-07-22 LAB — T4, FREE: Free T4: 0.86 ng/dL (ref 0.60–1.60)

## 2023-07-22 LAB — VITAMIN D 25 HYDROXY (VIT D DEFICIENCY, FRACTURES): VITD: 44.32 ng/mL (ref 30.00–100.00)

## 2023-07-22 LAB — TSH: TSH: 0.69 u[IU]/mL (ref 0.35–5.50)

## 2023-07-22 LAB — HEMOGLOBIN A1C: Hgb A1c MFr Bld: 6.3 % (ref 4.6–6.5)

## 2023-07-22 LAB — MAGNESIUM: Magnesium: 2 mg/dL (ref 1.5–2.5)

## 2023-07-22 MED ORDER — GABAPENTIN 100 MG PO CAPS
100.0000 mg | ORAL_CAPSULE | Freq: Three times a day (TID) | ORAL | 0 refills | Status: DC | PRN
Start: 2023-07-22 — End: 2023-09-30

## 2023-07-22 NOTE — Patient Instructions (Addendum)
 Please go downstairs for labs before you leave.  I ordered an ultrasound of your lower legs and you should receive a call to schedule this study. Let me know if it has been more than a week and you have not heard from anyone.  Try the gabapentin  to see if this helps since it worked in the past.   I also recommend warm soaks of your lower legs and stretching.  I will be in touch with your results.  Follow-up for any new or worsening symptoms in the meantime.  My records show that you are overdue for a diabetic eye exam.

## 2023-07-22 NOTE — Progress Notes (Unsigned)
 Subjective:     Patient ID: Jean [name], female    DOB: 04-18-1960, 63 y.o.   MRN: [id number]  Chief Complaint  Patient presents with   Foot Pain    Cramping in feet at night, can't do anything for mins and ends up massaging them. Went to podiatrist and did therapy but didn't help w the pain. Now they just cramp, especially the toes accompanied by tingling    Foot Pain Pertinent negatives include no abdominal pain, chest pain, chills, fever, nausea or vomiting.    History of Present Illness         C/o bilateral foot pain, cramping and tingling for years but has been more consistent and intense over the past 2-3 months. Usually in the evening and at night.  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   She saw a podiatrist last year for heel pain   No leg pain or swelling.      Health Maintenance Due  Topic Date Due   OPHTHALMOLOGY EXAM  Never done    Past Medical History:  Diagnosis Date   Anemia    Anxiety    Arthritis    back   Breast cancer (HCC) 08/03/2013   left   Cataract    LEFT FORMING   Dental crown present    Depression    GERD (gastroesophageal reflux disease)    no current med.   Hyperlipidemia    Hypertension    Radiation 01/18/14-03/09/14   Left breast    Past Surgical History:  Procedure Laterality Date   ABDOMINAL HYSTERECTOMY  12/16/2007   FOOT SURGERY Right    Lumpectory Left  08/30/2013   PORT-A-CATH REMOVAL Left 01/19/2015   Procedure: MINOR REMOVAL PORT-A-CATH;  Surgeon: Alm Angle, MD;  Location: [facility];  Service: General;  Laterality: Left;   PORTACATH PLACEMENT Right 09/26/2013   Procedure: INSERTION PORT-A-CATH;  Surgeon: Alm VEAR Angle, MD;  Location: [facility];  Service: General;  Laterality: Right;    Family History  Problem Relation Age of Onset   Hypertension Mother    Diabetes Mother    Hypertension Father    Heart failure Father     Heart attack Father    Breast cancer Paternal Aunt    Cervical cancer Paternal Aunt    Colon cancer Paternal Uncle    Prostate cancer Paternal Uncle    Crohn's disease Neg Hx    Esophageal cancer Neg Hx    Rectal cancer Neg Hx    Stomach cancer Neg Hx     Social History   Socioeconomic History   Marital status: Divorced    Spouse name: Not on file   Number of children: Not on file   Years of education: Not on file   Highest education level: Associate degree: occupational, Scientist, product/process development, or vocational program  Occupational History   Not on file  Tobacco Use   Smoking status: Former    Current packs/day: 0.00    Average packs/day: 0.3 packs/day for 2.5 years (0.6 ttl pk-yrs)    Types: Cigarettes    Start date: 08/04/2008    Quit date: 02/03/2011    Years since quitting: 12.4    Passive exposure: Never   Smokeless tobacco: Never  Vaping Use   Vaping status: Never Used  Substance and Sexual Activity   Alcohol use: Yes    Comment: ONCE A WEEK   Drug use:  No   Sexual activity: Not Currently  Other Topics Concern   Not on file  Social History Narrative   Not on file   Social Drivers of Health   Financial Resource Strain: Medium Risk (07/19/2023)   Overall Financial Resource Strain (CARDIA)    Difficulty of Paying Living Expenses: Somewhat hard  Food Insecurity: No Food Insecurity (07/19/2023)   Hunger Vital Sign    Worried About Running Out of Food in the Last Year: Never true    Ran Out of Food in the Last Year: Never true  Transportation Needs: No Transportation Needs (07/19/2023)   PRAPARE - Administrator, Civil Service (Medical): No    Lack of Transportation (Non-Medical): No  Physical Activity: Insufficiently Active (07/19/2023)   Exercise Vital Sign    Days of Exercise per Week: 3 days    Minutes of Exercise per Session: 30 min  Stress: No Stress Concern Present (07/19/2023)   [employer] of Occupational Health - Occupational Stress Questionnaire     Feeling of Stress: Only a little  Social Connections: Moderately Isolated (07/19/2023)   Social Connection and Isolation Panel    Frequency of Communication with Friends and Family: Once a week    Frequency of Social Gatherings with Friends and Family: Once a week    Attends Religious Services: More than 4 times per year    Active Member of [employer] or Organizations: Yes    Attends Engineer, structural: More than 4 times per year    Marital Status: Divorced  Catering manager Violence: Not on file    Outpatient Medications Prior to Visit  Medication Sig Dispense Refill   Cholecalciferol (VITAMIN D3 PO) Take by mouth daily. 2000 IU     Cyanocobalamin  (VITAMIN B-12 PO)      lisinopril -hydrochlorothiazide  (ZESTORETIC ) 10-12.5 MG tablet Take 1 tablet by mouth daily. 90 tablet 1   meloxicam  (MOBIC ) 15 MG tablet Take 1 tablet (15 mg total) by mouth daily. 30 tablet 1   metFORMIN  (GLUCOPHAGE ) 500 MG tablet TAKE 1 TABLET BY MOUTH EVERY DAY WITH BREAKFAST 90 tablet 1   Multiple Vitamin (MULTIVITAMIN PO) Take by mouth daily. CENTRUM     simvastatin  (ZOCOR ) 20 MG tablet Take 1 tablet (20 mg total) by mouth at bedtime. 90 tablet 3   venlafaxine  XR (EFFEXOR -XR) 75 MG 24 hr capsule TAKE 1 CAPSULE BY MOUTH DAILY WITH BREAKFAST. 90 capsule 1   betamethasone  dipropionate 0.05 % cream Apply topically 2 (two) times daily. (Patient not taking: Reported on 05/04/2023) 45 g 0   No facility-administered medications prior to visit.    Allergies  Allergen Reactions   Hydrocodone  Other (See Comments)    Makes very constipated     Review of Systems  Constitutional:  Negative for chills and fever.  Respiratory:  Negative for shortness of breath.   Cardiovascular:  Negative for chest pain, palpitations and leg swelling.  Gastrointestinal:  Negative for abdominal pain, constipation, diarrhea, nausea and vomiting.  Genitourinary:  Negative for dysuria, frequency and urgency.  Neurological:  Negative for  dizziness.       Objective:    Physical Exam   BP 114/78 (BP Location: Left Arm, Patient Position: Sitting)   Pulse 75   Temp 97.8 F (36.6 C) (Temporal)   Ht 5' 7 (1.702 m)   Wt 177 lb (80.3 kg)   LMP 11/03/2008   SpO2 99%   BMI 27.72 kg/m  Wt Readings from Last 3 Encounters:  07/22/23 177  lb (80.3 kg)  05/04/23 177 lb (80.3 kg)  04/03/23 179 lb (81.2 kg)       Assessment & Plan:   Problem List Items Addressed This Visit     Essential hypertension   Relevant Orders   CBC with Differential/Platelet   Comprehensive metabolic panel with GFR   TSH   T4, free   Hyperlipidemia associated with type 2 diabetes mellitus (HCC)   Relevant Orders   Lipid panel   Neuropathy - Primary   Relevant Orders   VAS US  ABI WITH/WO TBI   Type 2 diabetes mellitus without complication, without long-term current use of insulin (HCC)   Relevant Orders   CBC with Differential/Platelet   Comprehensive metabolic panel with GFR   Hemoglobin A1c   TSH   T4, free   Other Visit Diagnoses       Foot cramps       Relevant Orders   VAS US  ABI WITH/WO TBI   CBC with Differential/Platelet   Comprehensive metabolic panel with GFR   Ferritin   Magnesium   TSH   T4, free   Vitamin B12   Vitamin B1   VITAMIN D  25 Hydroxy (Vit-D Deficiency, Fractures)     Foot pain, bilateral       Relevant Orders   VAS US  ABI WITH/WO TBI     Paresthesias       Relevant Orders   Ferritin   Magnesium   TSH   T4, free   Vitamin B12   Vitamin B1   VITAMIN D  25 Hydroxy (Vit-D Deficiency, Fractures)       I have discontinued Tyhesha Chieffo's betamethasone  dipropionate. I am also having her start on gabapentin . Additionally, I am having her maintain her Multiple Vitamin (MULTIVITAMIN PO), Cholecalciferol (VITAMIN D3 PO), meloxicam , Cyanocobalamin  (VITAMIN B-12 PO), metFORMIN , lisinopril -hydrochlorothiazide , venlafaxine  XR, and simvastatin .  Meds ordered this encounter  Medications   gabapentin   (NEURONTIN ) 100 MG capsule    Sig: Take 1 capsule (100 mg total) by mouth 3 (three) times daily as needed.    Dispense:  90 capsule    Refill:  0    Supervising Provider:   ROLLENE NORRIS A [[id number]]

## 2023-07-25 LAB — VITAMIN B1: Vitamin B1 (Thiamine): 13 nmol/L (ref 8–30)

## 2023-07-26 ENCOUNTER — Ambulatory Visit: Payer: Self-pay | Admitting: Family Medicine

## 2023-07-26 NOTE — Progress Notes (Signed)
 Her labs look good overall except her cholesterol is not well controlled. I recommend switching her cholesterol medication from simvastatin  to Crestor if she is ok with this. Please let me know.

## 2023-07-28 ENCOUNTER — Other Ambulatory Visit: Payer: Self-pay | Admitting: Family Medicine

## 2023-07-28 DIAGNOSIS — E1169 Type 2 diabetes mellitus with other specified complication: Secondary | ICD-10-CM

## 2023-07-28 MED ORDER — ROSUVASTATIN CALCIUM 20 MG PO TABS: 20.0000 mg | ORAL_TABLET | Freq: Every day | ORAL | 1 refills | Status: DC

## 2023-07-30 ENCOUNTER — Ambulatory Visit (HOSPITAL_COMMUNITY)
Admission: RE | Admit: 2023-07-30 | Discharge: 2023-07-30 | Disposition: A | Discharge status: Home / Self Care | Source: Ambulatory Visit | Attending: Family Medicine | Admitting: Family Medicine

## 2023-07-30 DIAGNOSIS — M79671 Pain in right foot: Secondary | ICD-10-CM

## 2023-07-30 DIAGNOSIS — R252 Cramp and spasm: Secondary | ICD-10-CM

## 2023-07-30 DIAGNOSIS — G629 Polyneuropathy, unspecified: Secondary | ICD-10-CM

## 2023-07-30 DIAGNOSIS — M79672 Pain in left foot: Secondary | ICD-10-CM

## 2023-07-30 LAB — VAS US ABI WITH/WO TBI
Left ABI: 1.29
Right ABI: 0.95

## 2023-07-31 ENCOUNTER — Ambulatory Visit: Admitting: Family Medicine

## 2023-08-10 ENCOUNTER — Encounter: Payer: Self-pay | Admitting: Family Medicine

## 2023-09-30 ENCOUNTER — Ambulatory Visit: Admitting: Family Medicine

## 2023-09-30 ENCOUNTER — Ambulatory Visit: Payer: Self-pay | Admitting: Family Medicine

## 2023-09-30 ENCOUNTER — Encounter: Payer: Self-pay | Admitting: Family Medicine

## 2023-09-30 ENCOUNTER — Ambulatory Visit (INDEPENDENT_AMBULATORY_CARE_PROVIDER_SITE_OTHER)

## 2023-09-30 VITALS — BP 110/68 | HR 66 | Temp 97.6°F | Ht 67.0 in | Wt 179.0 lb

## 2023-09-30 DIAGNOSIS — R202 Paresthesia of skin: Secondary | ICD-10-CM | POA: Diagnosis not present

## 2023-09-30 DIAGNOSIS — R200 Anesthesia of skin: Secondary | ICD-10-CM | POA: Diagnosis not present

## 2023-09-30 DIAGNOSIS — R9431 Abnormal electrocardiogram [ECG] [EKG]: Secondary | ICD-10-CM | POA: Diagnosis not present

## 2023-09-30 DIAGNOSIS — G629 Polyneuropathy, unspecified: Secondary | ICD-10-CM

## 2023-09-30 DIAGNOSIS — E785 Hyperlipidemia, unspecified: Secondary | ICD-10-CM

## 2023-09-30 DIAGNOSIS — I10 Essential (primary) hypertension: Secondary | ICD-10-CM | POA: Diagnosis not present

## 2023-09-30 DIAGNOSIS — E1169 Type 2 diabetes mellitus with other specified complication: Secondary | ICD-10-CM

## 2023-09-30 DIAGNOSIS — E119 Type 2 diabetes mellitus without complications: Secondary | ICD-10-CM | POA: Diagnosis not present

## 2023-09-30 DIAGNOSIS — R0789 Other chest pain: Secondary | ICD-10-CM | POA: Diagnosis not present

## 2023-09-30 DIAGNOSIS — R079 Chest pain, unspecified: Secondary | ICD-10-CM

## 2023-09-30 DIAGNOSIS — R252 Cramp and spasm: Secondary | ICD-10-CM

## 2023-09-30 LAB — COMPREHENSIVE METABOLIC PANEL WITH GFR
ALT: 13 U/L (ref 0–35)
AST: 17 U/L (ref 0–37)
Albumin: 4.3 g/dL (ref 3.5–5.2)
Alkaline Phosphatase: 64 U/L (ref 39–117)
BUN: 15 mg/dL (ref 6–23)
CO2: 29 meq/L (ref 19–32)
Calcium: 9.5 mg/dL (ref 8.4–10.5)
Chloride: 100 meq/L (ref 96–112)
Creatinine, Ser: 0.87 mg/dL (ref 0.40–1.20)
GFR: 71.59 mL/min (ref >60.00)
Glucose, Bld: 100 mg/dL — ABNORMAL HIGH (ref 70–99)
Potassium: 4.1 meq/L (ref 3.5–5.1)
Sodium: 137 meq/L (ref 135–145)
Total Bilirubin: 0.4 mg/dL (ref 0.2–1.2)
Total Protein: 7.8 g/dL (ref 6.0–8.3)

## 2023-09-30 LAB — CBC
HCT: 37.2 % (ref 36.0–46.0)
Hemoglobin: 12.4 g/dL (ref 12.0–15.0)
MCHC: 33.4 g/dL (ref 30.0–36.0)
MCV: 85.1 fl (ref 78.0–100.0)
Platelets: 402 K/uL — ABNORMAL HIGH (ref 150.0–400.0)
RBC: 4.38 Mil/uL (ref 3.87–5.11)
RDW: 14.8 % (ref 11.5–15.5)
WBC: 4.1 K/uL (ref 4.0–10.5)

## 2023-09-30 LAB — TROPONIN I (HIGH SENSITIVITY): High Sens Troponin I: 4 ng/L (ref 2–17)

## 2023-09-30 LAB — CK: Total CK: 98 U/L (ref 17–177)

## 2023-09-30 LAB — VITAMIN B12: Vitamin B-12: 1490 pg/mL — ABNORMAL HIGH (ref 211–911)

## 2023-09-30 LAB — MAGNESIUM: Magnesium: 2.1 mg/dL (ref 1.5–2.5)

## 2023-09-30 LAB — TSH: TSH: 0.71 u[IU]/mL (ref 0.35–5.50)

## 2023-09-30 MED ORDER — GABAPENTIN 300 MG PO CAPS: 300.0000 mg | ORAL_CAPSULE | Freq: Two times a day (BID) | ORAL | 2 refills | Status: DC

## 2023-09-30 NOTE — Progress Notes (Signed)
 Subjective:     Patient ID: Jean [name], female    DOB: Dec 31, 1960, 63 y.o.   MRN: [id number]  Chief Complaint  Patient presents with   Tingling    Tingling in hands and feet for years now, was manageable but now getting worse. Right was the worst, now moved to left side.    Chest Pain    X3 weeks, continuous. Started on right but moved to left.     HPI  Discussed the use of AI scribe software for clinical note transcription with the patient, who gave verbal consent to proceed.  History of Present Illness Jean [name] is a 63 year old female with controlled diabetes and neuropathy who presents with chest pain and worsening neuropathy symptoms.  Chest pain - Present for three weeks - Initially localized to the right side as a muscle ache, now constant on the left side above the breast - Severity rated at 7/10 - Worsens with movement, not present at rest - No associated shortness of breath, palpitations, dizziness, or nausea - Similar episode occurred two years ago  Peripheral neuropathy symptoms - Numbness and tingling in hands and feet, worse on the left side - Associated with leg cramping - Symptoms have worsened recently, more noticeable during the day - Exacerbated by physical activity - Gabapentin  100 mg three times daily, feels ineffective - Previously on gabapentin  300 mg twice daily  Diabetes mellitus - Diabetes is controlled - Most recent A1c is 6.3%  Hyperlipidemia and statin intolerance - Recently discontinued Crestor  due to itching and rash - Rash and itching resolved after stopping Crestor   Oncologic history - History of chemotherapy in 2016     Health Maintenance Due  Topic Date Due   OPHTHALMOLOGY EXAM  Never done   COVID-19 Vaccine (5 - 2024-25 season) 10/05/2022   INFLUENZA VACCINE  09/04/2023    Past Medical History:  Diagnosis Date   Anemia    Anxiety    Arthritis    back   Breast cancer (HCC) 08/03/2013   left   Cataract    LEFT  FORMING   Dental crown present    Depression    GERD (gastroesophageal reflux disease)    no current med.   Hyperlipidemia    Hypertension    Radiation 01/18/14-03/09/14   Left breast    Past Surgical History:  Procedure Laterality Date   ABDOMINAL HYSTERECTOMY  12/16/2007   FOOT SURGERY Right    Lumpectory Left  08/30/2013   PORT-A-CATH REMOVAL Left 01/19/2015   Procedure: MINOR REMOVAL PORT-A-CATH;  Surgeon: Alm Angle, MD;  Location: [facility];  Service: General;  Laterality: Left;   PORTACATH PLACEMENT Right 09/26/2013   Procedure: INSERTION PORT-A-CATH;  Surgeon: Alm VEAR Angle, MD;  Location: [facility];  Service: General;  Laterality: Right;    Family History  Problem Relation Age of Onset   Hypertension Mother    Diabetes Mother    Hypertension Father    Heart failure Father    Heart attack Father    Breast cancer Paternal Aunt    Cervical cancer Paternal Aunt    Colon cancer Paternal Uncle    Prostate cancer Paternal Uncle    Crohn's disease Neg Hx    Esophageal cancer Neg Hx    Rectal cancer Neg Hx    Stomach cancer Neg Hx     Social History   Socioeconomic History   Marital status: Divorced    Spouse name: Not on file  Number of children: Not on file   Years of education: Not on file   Highest education level: Associate degree: occupational, technical, or vocational program  Occupational History   Not on file  Tobacco Use   Smoking status: Former    Current packs/day: 0.00    Average packs/day: 0.3 packs/day for 2.5 years (0.6 ttl pk-yrs)    Types: Cigarettes    Start date: 08/04/2008    Quit date: 02/03/2011    Years since quitting: 12.6    Passive exposure: Never   Smokeless tobacco: Never  Vaping Use   Vaping status: Never Used  Substance and Sexual Activity   Alcohol use: Yes    Comment: ONCE A WEEK   Drug use: No   Sexual activity: Not Currently  Other Topics Concern   Not on file  Social History Narrative   Not on file    Social Drivers of Health   Financial Resource Strain: Medium Risk (07/19/2023)   Overall Financial Resource Strain (CARDIA)    Difficulty of Paying Living Expenses: Somewhat hard  Food Insecurity: No Food Insecurity (07/19/2023)   Hunger Vital Sign    Worried About Running Out of Food in the Last Year: Never true    Ran Out of Food in the Last Year: Never true  Transportation Needs: No Transportation Needs (07/19/2023)   PRAPARE - Administrator, Civil Service (Medical): No    Lack of Transportation (Non-Medical): No  Physical Activity: Insufficiently Active (07/19/2023)   Exercise Vital Sign    Days of Exercise per Week: 3 days    Minutes of Exercise per Session: 30 min  Stress: No Stress Concern Present (07/19/2023)   [employer] of Occupational Health - Occupational Stress Questionnaire    Feeling of Stress: Only a little  Social Connections: Moderately Isolated (07/19/2023)   Social Connection and Isolation Panel    Frequency of Communication with Friends and Family: Once a week    Frequency of Social Gatherings with Friends and Family: Once a week    Attends Religious Services: More than 4 times per year    Active Member of [employer] or Organizations: Yes    Attends Engineer, structural: More than 4 times per year    Marital Status: Divorced  Catering manager Violence: Not on file    Outpatient Medications Prior to Visit  Medication Sig Dispense Refill   Cholecalciferol (VITAMIN D3 PO) Take by mouth daily. 2000 IU     Cyanocobalamin  (VITAMIN B-12 PO)      lisinopril -hydrochlorothiazide  (ZESTORETIC ) 10-12.5 MG tablet Take 1 tablet by mouth daily. 90 tablet 1   meloxicam  (MOBIC ) 15 MG tablet Take 1 tablet (15 mg total) by mouth daily. 30 tablet 1   metFORMIN  (GLUCOPHAGE ) 500 MG tablet TAKE 1 TABLET BY MOUTH EVERY DAY WITH BREAKFAST 90 tablet 1   Multiple Vitamin (MULTIVITAMIN PO) Take by mouth daily. CENTRUM     venlafaxine  XR (EFFEXOR -XR) 75 MG 24 hr  capsule TAKE 1 CAPSULE BY MOUTH DAILY WITH BREAKFAST. 90 capsule 1   gabapentin  (NEURONTIN ) 100 MG capsule Take 1 capsule (100 mg total) by mouth 3 (three) times daily as needed. 90 capsule 0   rosuvastatin  (CRESTOR ) 20 MG tablet Take 1 tablet (20 mg total) by mouth daily. (Patient not taking: Reported on 09/30/2023) 90 tablet 1   No facility-administered medications prior to visit.    Allergies  Allergen Reactions   Hydrocodone  Other (See Comments)    Makes very constipated  ROS See HPI    Objective:    Physical Exam Constitutional:      General: She is not in acute distress.    Appearance: She is not ill-appearing.  HENT:     Mouth/Throat:     Mouth: Mucous membranes are moist.     Pharynx: Oropharynx is clear.  Eyes:     Extraocular Movements: Extraocular movements intact.     Conjunctiva/sclera: Conjunctivae normal.     Pupils: Pupils are equal, round, and reactive to light.  Cardiovascular:     Rate and Rhythm: Normal rate and regular rhythm.  Pulmonary:     Effort: Pulmonary effort is normal.     Breath sounds: Normal breath sounds.  Chest:     Chest wall: Tenderness present. No deformity or crepitus.       Comments: TTP of anterior chest wall  Musculoskeletal:     Cervical back: Normal range of motion and neck supple. No tenderness.     Right lower leg: No edema.     Left lower leg: No edema.  Lymphadenopathy:     Cervical: No cervical adenopathy.  Skin:    General: Skin is warm and dry.  Neurological:     General: No focal deficit present.     Mental Status: She is alert and oriented to person, place, and time.     Cranial Nerves: No cranial nerve deficit.     Motor: No weakness.  Psychiatric:        Mood and Affect: Mood normal.        Behavior: Behavior normal.        Thought Content: Thought content normal.      BP 110/68   Pulse 66   Temp 97.6 F (36.4 C) (Temporal)   Ht 5' 7 (1.702 m)   Wt 179 lb (81.2 kg)   LMP 11/03/2008   SpO2  100%   BMI 28.04 kg/m  Wt Readings from Last 3 Encounters:  09/30/23 179 lb (81.2 kg)  07/22/23 177 lb (80.3 kg)  05/04/23 177 lb (80.3 kg)       Assessment & Plan:   Problem List Items Addressed This Visit     Essential hypertension   Relevant Orders   CBC (Completed)   TSH (Completed)   Comprehensive metabolic panel with GFR (Completed)   Hyperlipidemia associated with type 2 diabetes mellitus (HCC)   Neuropathy   Numbness and tingling of both legs   Relevant Orders   CBC (Completed)   TSH (Completed)   Comprehensive metabolic panel with GFR (Completed)   Magnesium (Completed)   Vitamin B12 (Completed)   Type 2 diabetes mellitus without complication, without long-term current use of insulin (HCC)   Relevant Orders   CBC (Completed)   TSH (Completed)   Comprehensive metabolic panel with GFR (Completed)   Other Visit Diagnoses       Chest pain, unspecified type    -  Primary   Relevant Orders   EKG 12-Lead   CBC (Completed)   TSH (Completed)   Troponin I (High Sensitivity) (Completed)   DG Chest 2 View (Completed)     Paresthesias       Relevant Orders   CBC (Completed)   TSH (Completed)   Comprehensive metabolic panel with GFR (Completed)   Magnesium (Completed)   Vitamin B12 (Completed)     Chest wall tenderness       Relevant Orders   DG Chest 2 View (Completed)     Abnormal  EKG         Muscle cramps       Relevant Orders   CBC (Completed)   TSH (Completed)   Comprehensive metabolic panel with GFR (Completed)   Magnesium (Completed)   Vitamin B12 (Completed)   CK (Creatine Kinase) (Completed)       Assessment and Plan Assessment & Plan Chest wall pain Chest pain began three weeks ago, initially on the right side, now constant on the left, rated 7/10 in severity. Pain is musculoskeletal, worsens with movement, and is tender upon palpation. No associated shortness of breath, palpitations, dizziness, or nausea. EKG shows abnormalities but no acute  myocardial infarction. Differential includes musculoskeletal strain versus cardiac origin. - Order blood work to check cardiac enzymes - Obtain EKG  -Troponin ordered along with other labs  - No recent cardiology follow-up   Peripheral neuropathy Long-standing peripheral neuropathy with worsening symptoms, including tingling and cramping in the feet, particularly at night. Current gabapentin  regimen is inadequate. -Labs done at previous visit did not show any deficiencies  - Increase gabapentin  to 300 mg twice a day - Consider neurology referral if symptoms do not improve  Type 2 diabetes mellitus, controlled Diabetes is well-controlled with a recent A1c of 6.3%.  Hyperlipidemia, history of adverse reaction to statin therapy Hyperlipidemia with recent discontinuation of Crestor  due to severe itching and rash, which resolved after discontinuation. She reports taking simvastatin  in the past.  Unsure about atorvastatin  but recalls it was not covered   Muscle cramps Experiencing muscle cramps, particularly in the toes, which worsen with activity. Hydration maintained but cramps persist. -check Mg and other labs  Parenthesis Experiencing paresthesias in hands and feet, worse on the left side, likely related to peripheral neuropathy.  EKG abnormalities EKG shows abnormalities that may indicate a past mild myocardial infarction or ischemic event. No acute changes indicating a current myocardial infarction. - Compare current EKG with previous EKG - Ordered troponin and result is negative   EKG shows NSR, rate 65, possible LVH, ST-T wave abnormality in anterolateral leads, mild change from previous EKG  Consider ordering CT coronary CT coronary calcium  score was 0 in 2023.  Declines referral back to cardiology currently.       I have discontinued Ella Cuaresma's gabapentin . I am also having her start on gabapentin . Additionally, I am having her maintain her Multiple Vitamin  (MULTIVITAMIN PO), Cholecalciferol (VITAMIN D3 PO), meloxicam , Cyanocobalamin  (VITAMIN B-12 PO), metFORMIN , lisinopril -hydrochlorothiazide , venlafaxine  XR, and rosuvastatin .  Meds ordered this encounter  Medications   gabapentin  (NEURONTIN ) 300 MG capsule    Sig: Take 1 capsule (300 mg total) by mouth 2 (two) times daily.    Dispense:  60 capsule    Refill:  2    Supervising Provider:   ROLLENE NORRIS A [[id number]]

## 2023-09-30 NOTE — Patient Instructions (Addendum)
 Please go downstairs for labs and a chest x-ray before you leave.  I am increasing your gabapentin  to 300 mg twice daily.  Lets see if this helps with your neuropathy symptoms.  Avoid any upper body strenuous activity to see if this helps improve your chest pain.  Stay well-hydrated.  Use meloxicam  only as needed.  If you develop any new or worsening symptoms such as dizziness, shortness of breath, heart racing or beating abnormally or worsening chest pain, call 911 or go to the emergency department.  I would most likely order a CT to evaluate blood flow of your heart and coronary arteries.  I will await your lab results.    Follow-up with me fasting in approximately 4 weeks.  Will recheck your cholesterol and diabetes labs at that time.

## 2023-10-01 ENCOUNTER — Ambulatory Visit: Payer: 59 | Admitting: Family Medicine

## 2023-10-19 ENCOUNTER — Other Ambulatory Visit: Payer: Self-pay | Admitting: Family Medicine

## 2023-10-19 DIAGNOSIS — F411 Generalized anxiety disorder: Secondary | ICD-10-CM

## 2023-10-20 ENCOUNTER — Encounter: Payer: Self-pay | Admitting: Family Medicine

## 2023-10-20 DIAGNOSIS — F411 Generalized anxiety disorder: Secondary | ICD-10-CM

## 2023-10-22 MED ORDER — VENLAFAXINE HCL ER 75 MG PO CP24
75.0000 mg | ORAL_CAPSULE | Freq: Every day | ORAL | 1 refills | Status: AC
Start: 2023-10-22 — End: ?

## 2023-10-29 ENCOUNTER — Ambulatory Visit: Admitting: Family Medicine

## 2023-10-29 ENCOUNTER — Ambulatory Visit: Payer: Self-pay | Admitting: Family Medicine

## 2023-10-29 ENCOUNTER — Encounter: Payer: Self-pay | Admitting: Family Medicine

## 2023-10-29 VITALS — BP 118/80 | HR 62 | Temp 97.4°F | Ht 67.0 in | Wt 179.8 lb

## 2023-10-29 DIAGNOSIS — E119 Type 2 diabetes mellitus without complications: Secondary | ICD-10-CM

## 2023-10-29 DIAGNOSIS — E1169 Type 2 diabetes mellitus with other specified complication: Secondary | ICD-10-CM

## 2023-10-29 DIAGNOSIS — E785 Hyperlipidemia, unspecified: Secondary | ICD-10-CM

## 2023-10-29 DIAGNOSIS — G629 Polyneuropathy, unspecified: Secondary | ICD-10-CM | POA: Diagnosis not present

## 2023-10-29 DIAGNOSIS — Z23 Encounter for immunization: Secondary | ICD-10-CM | POA: Diagnosis not present

## 2023-10-29 DIAGNOSIS — Z7984 Long term (current) use of oral hypoglycemic drugs: Secondary | ICD-10-CM

## 2023-10-29 LAB — CBC WITH DIFFERENTIAL/PLATELET
Basophils Absolute: 0 K/uL (ref 0.0–0.1)
Basophils Relative: 0.6 % (ref 0.0–3.0)
Eosinophils Absolute: 0 K/uL (ref 0.0–0.7)
Eosinophils Relative: 1.2 % (ref 0.0–5.0)
HCT: 35.4 % — ABNORMAL LOW (ref 36.0–46.0)
Hemoglobin: 11.6 g/dL — ABNORMAL LOW (ref 12.0–15.0)
Lymphocytes Relative: 38.9 % (ref 12.0–46.0)
Lymphs Abs: 1.3 K/uL (ref 0.7–4.0)
MCHC: 32.8 g/dL (ref 30.0–36.0)
MCV: 85.2 fl (ref 78.0–100.0)
Monocytes Absolute: 0.2 K/uL (ref 0.1–1.0)
Monocytes Relative: 5.7 % (ref 3.0–12.0)
Neutro Abs: 1.9 K/uL (ref 1.4–7.7)
Neutrophils Relative %: 53.6 % (ref 43.0–77.0)
Platelets: 384 K/uL (ref 150.0–400.0)
RBC: 4.15 Mil/uL (ref 3.87–5.11)
RDW: 15.2 % (ref 11.5–15.5)
WBC: 3.5 K/uL — ABNORMAL LOW (ref 4.0–10.5)

## 2023-10-29 LAB — LIPID PANEL
Cholesterol: 236 mg/dL — ABNORMAL HIGH (ref 0–200)
HDL: 57.8 mg/dL (ref >39.00)
LDL Cholesterol: 158 mg/dL — ABNORMAL HIGH (ref 0–99)
NonHDL: 177.7
Total CHOL/HDL Ratio: 4
Triglycerides: 99 mg/dL (ref 0.0–149.0)
VLDL: 19.8 mg/dL (ref 0.0–40.0)

## 2023-10-29 LAB — COMPREHENSIVE METABOLIC PANEL WITH GFR
ALT: 13 U/L (ref 0–35)
AST: 18 U/L (ref 0–37)
Albumin: 4.4 g/dL (ref 3.5–5.2)
Alkaline Phosphatase: 60 U/L (ref 39–117)
BUN: 14 mg/dL (ref 6–23)
CO2: 30 meq/L (ref 19–32)
Calcium: 9.5 mg/dL (ref 8.4–10.5)
Chloride: 102 meq/L (ref 96–112)
Creatinine, Ser: 0.86 mg/dL (ref 0.40–1.20)
GFR: 72.55 mL/min (ref >60.00)
Glucose, Bld: 99 mg/dL (ref 70–99)
Potassium: 3.9 meq/L (ref 3.5–5.1)
Sodium: 139 meq/L (ref 135–145)
Total Bilirubin: 0.3 mg/dL (ref 0.2–1.2)
Total Protein: 7.5 g/dL (ref 6.0–8.3)

## 2023-10-29 LAB — VITAMIN B12: Vitamin B-12: 665 pg/mL (ref 211–911)

## 2023-10-29 LAB — HEMOGLOBIN A1C: Hgb A1c MFr Bld: 6.5 % (ref 4.6–6.5)

## 2023-10-29 MED ORDER — ATORVASTATIN CALCIUM 10 MG PO TABS: 10.0000 mg | ORAL_TABLET | Freq: Every day | ORAL | 3 refills | Status: AC

## 2023-10-29 NOTE — Progress Notes (Signed)
 Subjective:     Patient ID: Jean [name], female    DOB: December 03, 1960, 63 y.o.   MRN: [id number]  Chief Complaint  Patient presents with   Medical Management of Chronic Issues    4 week f/u, concerned about swollen gland on neck, pops up every now and then, sometimes tender to the touch    HPI  Discussed the use of AI scribe software for clinical note transcription with the patient, who gave verbal consent to proceed.  History of Present Illness Jean [name] is a 63 year old female with neuropathy who presents for follow-up.  Peripheral neuropathy symptoms - Tingling and needle-like sensations persist, primarily in the feet and occasionally in the hand - Occasional numbness present - Symptoms have improved with gabapentin  - Reduced physical activity due to neuropathy symptoms - Discontinued regular walking  Chest pain - Intermittent chest pain present - Pain is less severe than previously - No details available regarding frequency or triggers  Glandular swelling - Swelling previously evaluated with x-ray - Swelling was tender last week, particularly on the right side - Currently no swelling or tenderness  Glycemic control - Does not check blood sugars at home - A1c levels have been stable: recent values include 6.3, 6.2, 6.4, and 6.3 - A1c was 6.7 in 2023  Weight fluctuation - Recent weight increase attributed to reduced physical activity secondary to neuropathy     Health Maintenance Due  Topic Date Due   OPHTHALMOLOGY EXAM  Never done   Influenza Vaccine  09/04/2023   COVID-19 Vaccine (5 - 2025-26 season) 10/05/2023   FOOT EXAM  10/01/2023    Past Medical History:  Diagnosis Date   Anemia    Anxiety    Arthritis    back   Breast cancer (HCC) 08/03/2013   left   Cataract    LEFT FORMING   Dental crown present    Depression    GERD (gastroesophageal reflux disease)    no current med.   Hyperlipidemia    Hypertension    Radiation 01/18/14-03/09/14    Left breast    Past Surgical History:  Procedure Laterality Date   ABDOMINAL HYSTERECTOMY  12/16/2007   FOOT SURGERY Right    Lumpectory Left  08/30/2013   PORT-A-CATH REMOVAL Left 01/19/2015   Procedure: MINOR REMOVAL PORT-A-CATH;  Surgeon: Alm Angle, MD;  Location: [facility];  Service: General;  Laterality: Left;   PORTACATH PLACEMENT Right 09/26/2013   Procedure: INSERTION PORT-A-CATH;  Surgeon: Alm VEAR Angle, MD;  Location: [facility];  Service: General;  Laterality: Right;    Family History  Problem Relation Age of Onset   Hypertension Mother    Diabetes Mother    Hypertension Father    Heart failure Father    Heart attack Father    Breast cancer Paternal Aunt    Cervical cancer Paternal Aunt    Colon cancer Paternal Uncle    Prostate cancer Paternal Uncle    Crohn's disease Neg Hx    Esophageal cancer Neg Hx    Rectal cancer Neg Hx    Stomach cancer Neg Hx     Social History   Socioeconomic History   Marital status: Divorced    Spouse name: Not on file   Number of children: Not on file   Years of education: Not on file   Highest education level: Associate degree: occupational, Scientist, product/process development, or vocational program  Occupational History   Not on file  Tobacco Use   Smoking  status: Former    Current packs/day: 0.00    Average packs/day: 0.3 packs/day for 2.5 years (0.6 ttl pk-yrs)    Types: Cigarettes    Start date: 08/04/2008    Quit date: 02/03/2011    Years since quitting: 12.7    Passive exposure: Never   Smokeless tobacco: Never  Vaping Use   Vaping status: Never Used  Substance and Sexual Activity   Alcohol use: Yes    Comment: ONCE A WEEK   Drug use: No   Sexual activity: Not Currently  Other Topics Concern   Not on file  Social History Narrative   Not on file   Social Drivers of Health   Financial Resource Strain: Medium Risk (07/19/2023)   Overall Financial Resource Strain (CARDIA)    Difficulty of Paying Living Expenses:  Somewhat hard  Food Insecurity: No Food Insecurity (07/19/2023)   Hunger Vital Sign    Worried About Running Out of Food in the Last Year: Never true    Ran Out of Food in the Last Year: Never true  Transportation Needs: No Transportation Needs (07/19/2023)   PRAPARE - Administrator, Civil Service (Medical): No    Lack of Transportation (Non-Medical): No  Physical Activity: Insufficiently Active (07/19/2023)   Exercise Vital Sign    Days of Exercise per Week: 3 days    Minutes of Exercise per Session: 30 min  Stress: No Stress Concern Present (07/19/2023)   [employer] of Occupational Health - Occupational Stress Questionnaire    Feeling of Stress: Only a little  Social Connections: Moderately Isolated (07/19/2023)   Social Connection and Isolation Panel    Frequency of Communication with Friends and Family: Once a week    Frequency of Social Gatherings with Friends and Family: Once a week    Attends Religious Services: More than 4 times per year    Active Member of [employer] or Organizations: Yes    Attends Engineer, structural: More than 4 times per year    Marital Status: Divorced  Catering manager Violence: Not on file    Outpatient Medications Prior to Visit  Medication Sig Dispense Refill   Cholecalciferol (VITAMIN D3 PO) Take by mouth daily. 2000 IU     Cyanocobalamin  (VITAMIN B-12 PO)      gabapentin  (NEURONTIN ) 300 MG capsule Take 1 capsule (300 mg total) by mouth 2 (two) times daily. 60 capsule 2   lisinopril -hydrochlorothiazide  (ZESTORETIC ) 10-12.5 MG tablet Take 1 tablet by mouth daily. 90 tablet 1   meloxicam  (MOBIC ) 15 MG tablet Take 1 tablet (15 mg total) by mouth daily. 30 tablet 1   metFORMIN  (GLUCOPHAGE ) 500 MG tablet TAKE 1 TABLET BY MOUTH EVERY DAY WITH BREAKFAST 90 tablet 1   Multiple Vitamin (MULTIVITAMIN PO) Take by mouth daily. CENTRUM     venlafaxine  XR (EFFEXOR -XR) 75 MG 24 hr capsule Take 1 capsule (75 mg total) by mouth daily with  breakfast. 90 capsule 1   rosuvastatin  (CRESTOR ) 20 MG tablet Take 1 tablet (20 mg total) by mouth daily. (Patient not taking: Reported on 10/29/2023) 90 tablet 1   No facility-administered medications prior to visit.    Allergies  Allergen Reactions   Hydrocodone  Other (See Comments)    Makes very constipated     ROS Per HPI    Objective:    Physical Exam Constitutional:      General: She is not in acute distress.    Appearance: She is not ill-appearing.  HENT:  Mouth/Throat:     Mouth: Mucous membranes are moist.     Pharynx: Oropharynx is clear.  Eyes:     Extraocular Movements: Extraocular movements intact.     Conjunctiva/sclera: Conjunctivae normal.     Pupils: Pupils are equal, round, and reactive to light.  Cardiovascular:     Rate and Rhythm: Normal rate and regular rhythm.  Pulmonary:     Effort: Pulmonary effort is normal.     Breath sounds: Normal breath sounds.  Musculoskeletal:     Cervical back: Normal range of motion and neck supple. No tenderness.     Right lower leg: No edema.     Left lower leg: No edema.  Lymphadenopathy:     Cervical: No cervical adenopathy.  Skin:    General: Skin is warm and dry.  Neurological:     General: No focal deficit present.     Mental Status: She is alert and oriented to person, place, and time.     Motor: No weakness.     Coordination: Coordination normal.     Gait: Gait normal.  Psychiatric:        Mood and Affect: Mood normal.        Behavior: Behavior normal.        Thought Content: Thought content normal.      BP 118/80 (BP Location: Left Arm, Patient Position: Sitting)   Pulse 62   Temp (!) 97.4 F (36.3 C) (Temporal)   Ht 5' 7 (1.702 m)   Wt 179 lb 12.8 oz (81.6 kg)   LMP 11/03/2008   SpO2 99%   BMI 28.16 kg/m  Wt Readings from Last 3 Encounters:  10/29/23 179 lb 12.8 oz (81.6 kg)  09/30/23 179 lb (81.2 kg)  07/22/23 177 lb (80.3 kg)       Assessment & Plan:   Problem List Items  Addressed This Visit     Hyperlipidemia associated with type 2 diabetes mellitus (HCC)   Relevant Medications   atorvastatin  (LIPITOR) 10 MG tablet   Other Relevant Orders   Lipid panel   Neuropathy   Relevant Orders   Vitamin B12   Type 2 diabetes mellitus without complication, without long-term current use of insulin (HCC) - Primary   Relevant Medications   atorvastatin  (LIPITOR) 10 MG tablet   Other Relevant Orders   CBC with Differential/Platelet   Comprehensive metabolic panel with GFR   Hemoglobin A1c   Vitamin B12    Assessment and Plan Assessment & Plan Peripheral neuropathy with paresthesias Symptoms are improving with gabapentin , though paresthesias persist with reduced severity. Tingling and needle-like sensations are particularly noted in the hand. - Continue gabapentin    Type 2 diabetes mellitus, controlled Diabetes is well-controlled with recent A1c levels between 6.2 and 6.4. No current home blood glucose monitoring. - Continue metformin  500 MG orally daily with breakfast - Consider resuming home blood glucose monitoring if A1c levels increase  Hyperlipidemia with history of adverse reaction to statin therapy Management is complicated by adverse reactions to previous statin therapies, including rash with Crestor  and insurance issues with atorvastatin . LDL goal is less than 70 due to diabetes. - Attempt to prescribe atorvastatin  again and check insurance coverage - Consider simvastatin  as an alternative if atorvastatin  is not covered  Intermittent salivary gland swelling and tenderness Intermittent swelling and tenderness of salivary glands, possibly due to duct blockage or infection. Symptoms are currently absent. - Advise use of sour candy to help unclog salivary ducts if symptoms recur  Chest pain, unspecified type Chest pain is less frequent and less severe. Previous evaluations included cardiac enzymes      I have discontinued Lahna Guertin's  rosuvastatin . I am also having her start on atorvastatin . Additionally, I am having her maintain her Multiple Vitamin (MULTIVITAMIN PO), Cholecalciferol (VITAMIN D3 PO), meloxicam , Cyanocobalamin  (VITAMIN B-12 PO), metFORMIN , lisinopril -hydrochlorothiazide , gabapentin , and venlafaxine  XR.  Meds ordered this encounter  Medications   atorvastatin  (LIPITOR) 10 MG tablet    Sig: Take 1 tablet (10 mg total) by mouth daily.    Dispense:  90 tablet    Refill:  3    Supervising Provider:   ROLLENE NORRIS A [[id number]]

## 2023-12-02 ENCOUNTER — Other Ambulatory Visit: Payer: Self-pay | Admitting: Family Medicine

## 2023-12-02 DIAGNOSIS — E119 Type 2 diabetes mellitus without complications: Secondary | ICD-10-CM

## 2024-03-01 ENCOUNTER — Ambulatory Visit: Admitting: Family Medicine

## 2024-03-09 ENCOUNTER — Ambulatory Visit: Admitting: Family Medicine

## 2024-03-09 ENCOUNTER — Encounter: Payer: Self-pay | Admitting: Family Medicine

## 2024-03-09 VITALS — BP 112/82 | HR 90 | Temp 97.9°F | Ht 67.0 in | Wt 185.0 lb

## 2024-03-09 DIAGNOSIS — F411 Generalized anxiety disorder: Secondary | ICD-10-CM

## 2024-03-09 DIAGNOSIS — E1169 Type 2 diabetes mellitus with other specified complication: Secondary | ICD-10-CM

## 2024-03-09 DIAGNOSIS — R252 Cramp and spasm: Secondary | ICD-10-CM

## 2024-03-09 DIAGNOSIS — E1140 Type 2 diabetes mellitus with diabetic neuropathy, unspecified: Secondary | ICD-10-CM

## 2024-03-09 DIAGNOSIS — Z853 Personal history of malignant neoplasm of breast: Secondary | ICD-10-CM

## 2024-03-09 DIAGNOSIS — I10 Essential (primary) hypertension: Secondary | ICD-10-CM

## 2024-03-09 LAB — CBC WITH DIFFERENTIAL/PLATELET
Basophils Absolute: 0 10*3/uL (ref 0.0–0.1)
Basophils Relative: 0.8 % (ref 0.0–3.0)
Eosinophils Absolute: 0.1 10*3/uL (ref 0.0–0.7)
Eosinophils Relative: 1.7 % (ref 0.0–5.0)
HCT: 34.7 % — ABNORMAL LOW (ref 36.0–46.0)
Hemoglobin: 11.5 g/dL — ABNORMAL LOW (ref 12.0–15.0)
Lymphocytes Relative: 36.2 % (ref 12.0–46.0)
Lymphs Abs: 1.4 10*3/uL (ref 0.7–4.0)
MCHC: 33.2 g/dL (ref 30.0–36.0)
MCV: 84.1 fl (ref 78.0–100.0)
Monocytes Absolute: 0.2 10*3/uL (ref 0.1–1.0)
Monocytes Relative: 6.3 % (ref 3.0–12.0)
Neutro Abs: 2.1 10*3/uL (ref 1.4–7.7)
Neutrophils Relative %: 55 % (ref 43.0–77.0)
Platelets: 353 10*3/uL (ref 150.0–400.0)
RBC: 4.13 Mil/uL (ref 3.87–5.11)
RDW: 15.1 % (ref 11.5–15.5)
WBC: 3.9 10*3/uL — ABNORMAL LOW (ref 4.0–10.5)

## 2024-03-09 LAB — COMPREHENSIVE METABOLIC PANEL WITH GFR
ALT: 11 U/L (ref 3–35)
AST: 16 U/L (ref 5–37)
Albumin: 4.4 g/dL (ref 3.5–5.2)
Alkaline Phosphatase: 73 U/L (ref 39–117)
BUN: 13 mg/dL (ref 6–23)
CO2: 30 meq/L (ref 19–32)
Calcium: 9.6 mg/dL (ref 8.4–10.5)
Chloride: 100 meq/L (ref 96–112)
Creatinine, Ser: 0.82 mg/dL (ref 0.40–1.20)
GFR: 76.62 mL/min
Glucose, Bld: 88 mg/dL (ref 70–99)
Potassium: 3.4 meq/L — ABNORMAL LOW (ref 3.5–5.1)
Sodium: 139 meq/L (ref 135–145)
Total Bilirubin: 0.4 mg/dL (ref 0.2–1.2)
Total Protein: 7.8 g/dL (ref 6.0–8.3)

## 2024-03-09 LAB — MAGNESIUM: Magnesium: 1.9 mg/dL (ref 1.5–2.5)

## 2024-03-09 LAB — LIPID PANEL
Cholesterol: 171 mg/dL (ref 28–200)
HDL: 51.7 mg/dL
LDL Cholesterol: 99 mg/dL (ref 10–99)
NonHDL: 119.78
Total CHOL/HDL Ratio: 3
Triglycerides: 102 mg/dL (ref 10.0–149.0)
VLDL: 20.4 mg/dL (ref 0.0–40.0)

## 2024-03-09 LAB — MICROALBUMIN / CREATININE URINE RATIO
Creatinine,U: 199 mg/dL
Microalb Creat Ratio: UNDETERMINED mg/g (ref 0.0–30.0)
Microalb, Ur: <0.7 mg/dL

## 2024-03-09 LAB — FOLATE: Folate: 18.1 ng/mL

## 2024-03-09 LAB — VITAMIN B12: Vitamin B-12: 652 pg/mL (ref 211–911)

## 2024-03-09 LAB — HEMOGLOBIN A1C: Hgb A1c MFr Bld: 6.5 % (ref 4.6–6.5)

## 2024-03-09 LAB — TSH: TSH: 0.74 u[IU]/mL (ref 0.35–5.50)

## 2024-03-09 LAB — FERRITIN: Ferritin: 168.7 ng/mL (ref 10.0–291.0)

## 2024-03-09 NOTE — Progress Notes (Signed)
 "  Subjective:     Patient ID: Jean [name], female    DOB: 04-May-1960, 64 y.o.   MRN: [id number]  Chief Complaint  Patient presents with   Medical Management of Chronic Issues    4 month f/u    HPI  Discussed the use of AI scribe software for clinical note transcription with the patient, who gave verbal consent to proceed.  History of Present Illness Jean [name] is a 64 year old female with diabetes and hyperlipidemia who presents for follow-up of chronic health conditions.  Lower extremity muscle cramps - Worsening painful cramps in the feet, predominantly left-sided, over the past six months - Severe episode last night with toes unable to lay flat - Magnesium oxide provided no relief - Gabapentin  used once during the day and once at night, but doses are sometimes skipped - Increased physical activity exacerbates symptoms - hx of normal ABIs  Peripheral neuropathy symptoms - History of neuropathy following chemotherapy for breast cancer - Burning and tingling sensations in the feet, especially severe last night - Gabapentin  provides partial relief - No prior neurology evaluation - Feet feel cold constantly  Diabetes mellitus management - A1c was 6.5% in September - Metformin  taken once daily - Diet consists of one meal in the evening, with occasional banana in the morning  Visual disturbance and cataract - Diabetic eye exam within the past year - Diagnosed with cataract affecting night vision - Experiences glare from headlights at night - Avoids night driving and relies on family for transportation at night  Cardiopulmonary symptoms - No chest pain, dizziness, shortness of breath, palpitations, LE edema     Health Maintenance Due  Topic Date Due   OPHTHALMOLOGY EXAM  Never done   Diabetic kidney evaluation - Urine ACR  04/02/2024    Past Medical History:  Diagnosis Date   Anemia    Anxiety    Arthritis    back   Breast cancer (HCC) 08/03/2013   left    Cataract    LEFT FORMING   Dental crown present    Depression    GERD (gastroesophageal reflux disease)    no current med.   Hyperlipidemia    Hypertension    Radiation 01/18/14-03/09/14   Left breast    Past Surgical History:  Procedure Laterality Date   ABDOMINAL HYSTERECTOMY  12/16/2007   FOOT SURGERY Right    Lumpectory Left  08/30/2013   PORT-A-CATH REMOVAL Left 01/19/2015   Procedure: MINOR REMOVAL PORT-A-CATH;  Surgeon: Alm Angle, MD;  Location: [facility];  Service: General;  Laterality: Left;   PORTACATH PLACEMENT Right 09/26/2013   Procedure: INSERTION PORT-A-CATH;  Surgeon: Alm VEAR Angle, MD;  Location: [facility];  Service: General;  Laterality: Right;    Family History  Problem Relation Age of Onset   Hypertension Mother    Diabetes Mother    Hypertension Father    Heart failure Father    Heart attack Father    Breast cancer Paternal Aunt    Cervical cancer Paternal Aunt    Colon cancer Paternal Uncle    Prostate cancer Paternal Uncle    Crohn's disease Neg Hx    Esophageal cancer Neg Hx    Rectal cancer Neg Hx    Stomach cancer Neg Hx     Social History   Socioeconomic History   Marital status: Divorced    Spouse name: Not on file   Number of children: Not on file   Years of education:  Not on file   Highest education level: Associate degree: occupational, technical, or vocational program  Occupational History   Not on file  Tobacco Use   Smoking status: Former    Current packs/day: 0.00    Average packs/day: 0.3 packs/day for 2.5 years (0.6 ttl pk-yrs)    Types: Cigarettes    Start date: 08/04/2008    Quit date: 02/03/2011    Years since quitting: 13.1    Passive exposure: Never   Smokeless tobacco: Never  Vaping Use   Vaping status: Never Used  Substance and Sexual Activity   Alcohol use: Yes    Comment: ONCE A WEEK   Drug use: No   Sexual activity: Not Currently  Other Topics Concern   Not on file  Social History  Narrative   Not on file   Social Drivers of Health   Tobacco Use: Medium Risk (03/09/2024)   Patient History    Smoking Tobacco Use: Former    Smokeless Tobacco Use: Never    Passive Exposure: Never  Physicist, Medical Strain: Medium Risk (07/19/2023)   Overall Financial Resource Strain (CARDIA)    Difficulty of Paying Living Expenses: Somewhat hard  Food Insecurity: No Food Insecurity (07/19/2023)   Epic    Worried About Programme Researcher, Broadcasting/film/video in the Last Year: Never true    Ran Out of Food in the Last Year: Never true  Transportation Needs: No Transportation Needs (07/19/2023)   Epic    Lack of Transportation (Medical): No    Lack of Transportation (Non-Medical): No  Physical Activity: Insufficiently Active (07/19/2023)   Exercise Vital Sign    Days of Exercise per Week: 3 days    Minutes of Exercise per Session: 30 min  Stress: No Stress Concern Present (07/19/2023)   [employer] of Occupational Health - Occupational Stress Questionnaire    Feeling of Stress: Only a little  Social Connections: Moderately Isolated (07/19/2023)   Social Connection and Isolation Panel    Frequency of Communication with Friends and Family: Once a week    Frequency of Social Gatherings with Friends and Family: Once a week    Attends Religious Services: More than 4 times per year    Active Member of Clubs or Organizations: Yes    Attends Banker Meetings: More than 4 times per year    Marital Status: Divorced  Intimate Partner Violence: Not on file  Depression (PHQ2-9): Low Risk (03/09/2024)   Depression (PHQ2-9)    PHQ-2 Score: 0  Alcohol Screen: Low Risk (07/19/2023)   Alcohol Screen    Last Alcohol Screening Score (AUDIT): 2  Housing: Low Risk (07/19/2023)   Epic    Unable to Pay for Housing in the Last Year: No    Number of Times Moved in the Last Year: 0    Homeless in the Last Year: No  Utilities: Not on file  Health Literacy: Not on file    Outpatient Medications Prior to  Visit  Medication Sig Dispense Refill   atorvastatin  (LIPITOR) 10 MG tablet Take 1 tablet (10 mg total) by mouth daily. 90 tablet 3   Cholecalciferol (VITAMIN D3 PO) Take by mouth daily. 2000 IU     Cyanocobalamin  (VITAMIN B-12 PO)      gabapentin  (NEURONTIN ) 300 MG capsule Take 1 capsule (300 mg total) by mouth 2 (two) times daily. 60 capsule 2   lisinopril -hydrochlorothiazide  (ZESTORETIC ) 10-12.5 MG tablet TAKE 1 TABLET BY MOUTH EVERY DAY 90 tablet 1   meloxicam  (MOBIC )  15 MG tablet Take 1 tablet (15 mg total) by mouth daily. 30 tablet 1   metFORMIN  (GLUCOPHAGE ) 500 MG tablet TAKE 1 TABLET BY MOUTH EVERY DAY WITH BREAKFAST 90 tablet 1   Multiple Vitamin (MULTIVITAMIN PO) Take by mouth daily. CENTRUM     venlafaxine  XR (EFFEXOR -XR) 75 MG 24 hr capsule Take 1 capsule (75 mg total) by mouth daily with breakfast. 90 capsule 1   No facility-administered medications prior to visit.    Allergies[1]  ROS Per HPI    Objective:    Physical Exam Constitutional:      General: She is not in acute distress.    Appearance: She is not ill-appearing.  HENT:     Mouth/Throat:     Mouth: Mucous membranes are moist.     Pharynx: Oropharynx is clear.  Eyes:     Extraocular Movements: Extraocular movements intact.     Conjunctiva/sclera: Conjunctivae normal.  Cardiovascular:     Rate and Rhythm: Normal rate and regular rhythm.  Pulmonary:     Effort: Pulmonary effort is normal.     Breath sounds: Normal breath sounds.  Musculoskeletal:        General: Normal range of motion.     Cervical back: Normal range of motion and neck supple.     Right lower leg: No edema.     Left lower leg: No edema.  Skin:    General: Skin is warm and dry.  Neurological:     General: No focal deficit present.     Mental Status: She is alert and oriented to person, place, and time.     Sensory: No sensory deficit.     Motor: No weakness.     Coordination: Coordination normal.     Gait: Gait normal.      Comments: Foot exam done and normal sensation including to monofilament   Psychiatric:        Mood and Affect: Mood normal.        Behavior: Behavior normal.        Thought Content: Thought content normal.      BP 112/82   Pulse 90   Temp 97.9 F (36.6 C) (Temporal)   Ht 5' 7 (1.702 m)   Wt 185 lb (83.9 kg)   LMP 11/03/2008   SpO2 96%   BMI 28.98 kg/m  Wt Readings from Last 3 Encounters:  03/09/24 185 lb (83.9 kg)  10/29/23 179 lb 12.8 oz (81.6 kg)  09/30/23 179 lb (81.2 kg)       Assessment & Plan:   Problem List Items Addressed This Visit     Essential hypertension   Relevant Orders   Comprehensive metabolic panel with GFR   CBC with Differential/Platelet   TSH   GAD (generalized anxiety disorder)   Hyperlipidemia associated with type 2 diabetes mellitus (HCC) - Primary   Relevant Orders   Lipid panel   Personal history of malignant neoplasm of breast   Other Visit Diagnoses       Controlled type 2 diabetes with neuropathy (HCC)       Relevant Orders   Comprehensive metabolic panel with GFR   CBC with Differential/Platelet   TSH   Hemoglobin A1c   Vitamin B12   Microalbumin / creatinine urine ratio     Foot cramps       Relevant Orders   Comprehensive metabolic panel with GFR   CBC with Differential/Platelet   TSH   Ferritin   Folate  Vitamin B12   Magnesium       Assessment and Plan Assessment & Plan Type 2 diabetes mellitus with peripheral neuropathy Chronic peripheral neuropathy with burning and tingling sensations, primarily in the feet, exacerbated by activity. Gabapentin  provides some relief. Good sensation and blood flow in the feet. No prior neurology consultation for neuropathy. ABIs done recently.  - Continue gabapentin  300 mg oral bid. - Will consider referral to neurology if gabapentin  becomes ineffective. - Encouraged regular foot exams.  Hyperlipidemia Currently managed with atorvastatin . - Continue atorvastatin .  Essential  hypertension Blood pressure managed with lisinopril -hydrochlorothiazide . - Continue lisinopril -hydrochlorothiazide  10-12.5 mg oral daily.  Generalized anxiety disorder Anxiety managed with venlafaxine , which is effective. - Continue venlafaxine  XR 75 mg oral daily.  Cataract Cataract formation with associated glare and discomfort during night driving. - Continue to avoid night driving due to glare and discomfort. - Follow up with ophthalmologist as recommended  General health maintenance Up to date on diabetic eye exam. Discussed dietary habits and weight management. - Encouraged balanced meals twice daily to manage weight. - Continue regular eye exams.     I am having Lonell Miu maintain her Multiple Vitamin (MULTIVITAMIN PO), Cholecalciferol (VITAMIN D3 PO), meloxicam , Cyanocobalamin  (VITAMIN B-12 PO), gabapentin , venlafaxine  XR, atorvastatin , lisinopril -hydrochlorothiazide , and metFORMIN .  No orders of the defined types were placed in this encounter.      [1]  Allergies Allergen Reactions   Hydrocodone  Other (See Comments)    Makes very constipated    "

## 2024-03-10 ENCOUNTER — Other Ambulatory Visit: Payer: Self-pay | Admitting: Family Medicine

## 2024-07-07 ENCOUNTER — Ambulatory Visit: Admitting: Family Medicine
